# Patient Record
Sex: Male | Born: 1955
Health system: Southern US, Community
[De-identification: ages and names within clinical notes are randomized; demographics above are authoritative.]

## PROBLEM LIST (undated history)

## (undated) DIAGNOSIS — I483 Typical atrial flutter: Secondary | ICD-10-CM

## (undated) DIAGNOSIS — E785 Hyperlipidemia, unspecified: Secondary | ICD-10-CM

## (undated) DIAGNOSIS — N529 Male erectile dysfunction, unspecified: Secondary | ICD-10-CM

## (undated) DIAGNOSIS — K5792 Diverticulitis of intestine, part unspecified, without perforation or abscess without bleeding: Secondary | ICD-10-CM

## (undated) DIAGNOSIS — Z9889 Other specified postprocedural states: Secondary | ICD-10-CM

## (undated) DIAGNOSIS — I10 Essential (primary) hypertension: Secondary | ICD-10-CM

## (undated) DIAGNOSIS — R112 Nausea with vomiting, unspecified: Secondary | ICD-10-CM

## (undated) DIAGNOSIS — E039 Hypothyroidism, unspecified: Secondary | ICD-10-CM

## (undated) DIAGNOSIS — M7500 Adhesive capsulitis of unspecified shoulder: Secondary | ICD-10-CM

## (undated) DIAGNOSIS — E119 Type 2 diabetes mellitus without complications: Secondary | ICD-10-CM

## (undated) HISTORY — DX: Adhesive capsulitis of unspecified shoulder: M75.00

## (undated) HISTORY — PX: CERVICAL DISC SURGERY: SHX588

## (undated) HISTORY — DX: Hypothyroidism, unspecified: E03.9

## (undated) HISTORY — PX: BACK SURGERY: SHX140

## (undated) HISTORY — DX: Typical atrial flutter: I48.3

## (undated) HISTORY — PX: OTHER SURGICAL HISTORY: SHX169

## (undated) HISTORY — DX: Hyperlipidemia, unspecified: E78.5

## (undated) HISTORY — DX: Male erectile dysfunction, unspecified: N52.9

## (undated) HISTORY — PX: PROSTATE BIOPSY: SHX241

## (undated) HISTORY — DX: Essential (primary) hypertension: I10

---

## 2009-03-21 ENCOUNTER — Observation Stay (HOSPITAL_COMMUNITY): Admission: EM | Admit: 2009-03-21 | Discharge: 2009-03-22 | Payer: Self-pay | Admitting: Emergency Medicine

## 2011-02-13 LAB — CBC
Hemoglobin: 14 g/dL (ref 13.0–17.0)
MCHC: 35.3 g/dL (ref 30.0–36.0)
RDW: 13.2 % (ref 11.5–15.5)

## 2011-02-13 LAB — GLUCOSE, CAPILLARY: Glucose-Capillary: 175 mg/dL — ABNORMAL HIGH (ref 70–99)

## 2011-02-13 LAB — TROPONIN I: Troponin I: 0.03 ng/mL (ref 0.00–0.06)

## 2011-02-13 LAB — DIFFERENTIAL
Lymphs Abs: 1.3 10*3/uL (ref 0.7–4.0)
Monocytes Relative: 5 % (ref 3–12)
Neutro Abs: 4.9 10*3/uL (ref 1.7–7.7)
Neutrophils Relative %: 73 % (ref 43–77)

## 2011-02-13 LAB — CARDIAC PANEL(CRET KIN+CKTOT+MB+TROPI)
CK, MB: 2.5 ng/mL (ref 0.3–4.0)
Total CK: 256 U/L — ABNORMAL HIGH (ref 7–232)

## 2011-02-13 LAB — COMPREHENSIVE METABOLIC PANEL
ALT: 25 U/L (ref 0–53)
Calcium: 9.5 mg/dL (ref 8.4–10.5)
Glucose, Bld: 178 mg/dL — ABNORMAL HIGH (ref 70–99)
Sodium: 137 mEq/L (ref 135–145)
Total Protein: 6.9 g/dL (ref 6.0–8.3)

## 2011-02-13 LAB — POCT CARDIAC MARKERS
CKMB, poc: 2.4 ng/mL (ref 1.0–8.0)
Myoglobin, poc: 69.8 ng/mL (ref 12–200)
Troponin i, poc: <0.05 ng/mL (ref 0.00–0.09)

## 2011-02-13 LAB — CK TOTAL AND CKMB (NOT AT ARMC)
CK, MB: 3.5 ng/mL (ref 0.3–4.0)
Total CK: 335 U/L — ABNORMAL HIGH (ref 7–232)

## 2011-03-20 NOTE — H&P (Signed)
NAME:  Mark Shah, Mark Shah            ACCOUNT NO.:  [id number]   MEDICAL RECORD NO.:  [id number]          PATIENT TYPE:  EMS   LOCATION:  [facility]                         FACILITY:  [facility]   PHYSICIAN:  Hollice Espy, M.D.DATE OF BIRTH:  01/03/1956   DATE OF ADMISSION:  03/21/2009  DATE OF DISCHARGE:                              HISTORY & PHYSICAL   PRIMARY CARE PHYSICIAN:  Dr. Catha Gosselin.   CHIEF COMPLAINT:  Chest pressure.   HISTORY OF PRESENT ILLNESS:  The patient is a 55 year old white male  with a past medical history of diabetes and hypothyroidism who 2-days  ago started having some chest discomfort just left to the mid sternum,  described as pressure, non-radiating, initially constant for most of  Saturday, but then Sunday became intermittent, thus he waited to come  into the emergency room.  His pain never completely went away.  Again,  it radiated no where else, did not cause any shortness of breath,  nausea, vomiting, light headedness, dizziness, but with it never really  going away he became concerned and came into the emergency room for  further evaluation and treatment.  In the emergency room, he had a chest  x-ray which showed no active disease.  Labs were all completely normal.  Vital signs noted a blood pressure initially of 141/94, then down to  122/77.  He did not feel that this was positional.  He also felt that  when he took a deep inspiration this actually made it a little bit  better.  Currently, he is doing well.  He denies any headaches, vision  changes or dysphagia.  He complains of some mild chest pressure, about a  1/10.  No palpitations or shortness of breath, wheeze, cough, abdominal  pain, hematuria, dysuria, constipation, diarrhea, focal extremity  numbness, weakness or pain.   REVIEW OF SYSTEMS:  Otherwise negative.   PAST MEDICAL HISTORY:  1. Diabetes mellitus.  2. Hypertension.   MEDICATIONS:  1. Aspirin.  2. Fish oil.  3. Humalog, Lantus 18  units subcu in the morning.  4. Levoxyl 175 mcg p.o. daily.  5. Lipitor 80 p.o. daily.  6. Micardis which he takes because of kidneys because of his diabetes.   ALLERGIES:  HE IS ALLERGIC TO ERYTHROMYCIN.   SOCIAL HISTORY:  He denies any tobacco, alcohol or drug use.   FAMILY HISTORY:  Negative for coronary artery disease.  Noted for some  atrial fibrillation and valvular disease.   PHYSICAL EXAMINATION:  VITAL SIGNS:  On admission, temperature 98.2,  heart rate 89, blood pressure 141/94, now down to 123/77, respirations  18.  O2 saturation 97% on room air.  GENERAL:  He is alert and oriented x3 in no apparent distress.  HEENT:  Normocephalic, atraumatic.  His mucous membranes are moist.  NECK:  He has no carotid bruits.  HEART:  Regular rate and rhythm.  S1-S2.  LUNGS:  Clear to auscultation bilaterally.  ABDOMEN:  Soft, nontender, nondistended.  Positive bowel sounds.  EXTREMITIES:  Show no clubbing, cyanosis or edema.  SKIN:  Shows no wounds or breakdown.   LABORATORY DATA:  EKG  shows a normal sinus rhythm with NRSR prime in V1.  Chest x-ray is unremarkable.  White count is 6.6.  Hemoglobin and  hematocrit is 14 and 40.  MCV 96.  Platelet count 217.  Sodium 137,  potassium 3.6, chloride 104, bicarb 27, BUN 11, creatinine 1, glucose  178.  LFTs are normal.  CPK is 69.8, MB 2.4.  Troponin-I less than 0.05.   ASSESSMENT/PLAN:  Chest pain in a patient with a history of  hypothyroidism and diabetes mellitus.  We will plan to check two more  sets of cardiac markers and likely we will be able to get outpatient  stress test.      Hollice Espy, M.D.  Electronically Signed     Hollice Espy, M.D.  Electronically Signed    SKK/MEDQ  D:  03/21/2009  T:  03/21/2009  Job:  [id number]   cc:   Caryn Bee L. Little, M.D.

## 2013-08-28 ENCOUNTER — Encounter (HOSPITAL_COMMUNITY): Payer: Self-pay | Admitting: Emergency Medicine

## 2013-08-28 ENCOUNTER — Ambulatory Visit
Admission: RE | Admit: 2013-08-28 | Discharge: 2013-08-28 | Disposition: A | Discharge status: Home / Self Care | Payer: BC Managed Care – PPO | Source: Ambulatory Visit | Attending: Family Medicine | Admitting: Family Medicine

## 2013-08-28 ENCOUNTER — Inpatient Hospital Stay (HOSPITAL_COMMUNITY)
Admission: EM | Admit: 2013-08-28 | Discharge: 2013-08-31 | DRG: 392 | Disposition: A | Discharge status: Home / Self Care | Payer: BC Managed Care – PPO | Attending: Internal Medicine | Admitting: Internal Medicine

## 2013-08-28 ENCOUNTER — Other Ambulatory Visit: Payer: Self-pay | Admitting: Family Medicine

## 2013-08-28 DIAGNOSIS — R509 Fever, unspecified: Secondary | ICD-10-CM

## 2013-08-28 DIAGNOSIS — K5792 Diverticulitis of intestine, part unspecified, without perforation or abscess without bleeding: Secondary | ICD-10-CM | POA: Diagnosis present

## 2013-08-28 DIAGNOSIS — K5720 Diverticulitis of large intestine with perforation and abscess without bleeding: Secondary | ICD-10-CM

## 2013-08-28 DIAGNOSIS — Z79899 Other long term (current) drug therapy: Secondary | ICD-10-CM

## 2013-08-28 DIAGNOSIS — E109 Type 1 diabetes mellitus without complications: Secondary | ICD-10-CM | POA: Diagnosis present

## 2013-08-28 DIAGNOSIS — E1069 Type 1 diabetes mellitus with other specified complication: Secondary | ICD-10-CM | POA: Diagnosis present

## 2013-08-28 DIAGNOSIS — R109 Unspecified abdominal pain: Secondary | ICD-10-CM

## 2013-08-28 DIAGNOSIS — Z87891 Personal history of nicotine dependence: Secondary | ICD-10-CM

## 2013-08-28 DIAGNOSIS — Z9641 Presence of insulin pump (external) (internal): Secondary | ICD-10-CM

## 2013-08-28 DIAGNOSIS — E039 Hypothyroidism, unspecified: Secondary | ICD-10-CM | POA: Insufficient documentation

## 2013-08-28 DIAGNOSIS — K5732 Diverticulitis of large intestine without perforation or abscess without bleeding: Principal | ICD-10-CM | POA: Diagnosis present

## 2013-08-28 DIAGNOSIS — Z794 Long term (current) use of insulin: Secondary | ICD-10-CM

## 2013-08-28 DIAGNOSIS — E876 Hypokalemia: Secondary | ICD-10-CM | POA: Diagnosis present

## 2013-08-28 DIAGNOSIS — R946 Abnormal results of thyroid function studies: Secondary | ICD-10-CM | POA: Diagnosis present

## 2013-08-28 DIAGNOSIS — I10 Essential (primary) hypertension: Secondary | ICD-10-CM | POA: Diagnosis present

## 2013-08-28 HISTORY — DX: Nausea with vomiting, unspecified: R11.2

## 2013-08-28 HISTORY — DX: Type 2 diabetes mellitus without complications: E11.9

## 2013-08-28 HISTORY — DX: Type 1 diabetes mellitus without complications: E10.9

## 2013-08-28 HISTORY — DX: Other specified postprocedural states: Z98.890

## 2013-08-28 HISTORY — DX: Hypothyroidism, unspecified: E03.9

## 2013-08-28 HISTORY — DX: Diverticulitis of intestine, part unspecified, without perforation or abscess without bleeding: K57.92

## 2013-08-28 LAB — GLUCOSE, CAPILLARY
Glucose-Capillary: 39 mg/dL — CL (ref 70–99)
Glucose-Capillary: 88 mg/dL (ref 70–99)
Glucose-Capillary: 70 mg/dL (ref 70–99)

## 2013-08-28 LAB — CBC
HCT: 35.9 % — ABNORMAL LOW (ref 39.0–52.0)
MCHC: 35.1 g/dL (ref 30.0–36.0)
MCV: 94.7 fL (ref 78.0–100.0)
RDW: 12.3 % (ref 11.5–15.5)

## 2013-08-28 LAB — COMPREHENSIVE METABOLIC PANEL
Albumin: 3.3 g/dL — ABNORMAL LOW (ref 3.5–5.2)
BUN: 9 mg/dL (ref 6–23)
Chloride: 95 mEq/L — ABNORMAL LOW (ref 96–112)
Creatinine, Ser: 1.11 mg/dL (ref 0.50–1.35)
GFR calc Af Amer: 83 mL/min — ABNORMAL LOW (ref >90)
Glucose, Bld: 255 mg/dL — ABNORMAL HIGH (ref 70–99)
Total Bilirubin: 0.8 mg/dL (ref 0.3–1.2)
Total Protein: 7 g/dL (ref 6.0–8.3)

## 2013-08-28 MED ORDER — METRONIDAZOLE IN NACL 5-0.79 MG/ML-% IV SOLN
500.0000 mg | Freq: Three times a day (TID) | INTRAVENOUS | Status: DC
  Administered 2013-08-28 – 2013-08-31 (×8): 500 mg via INTRAVENOUS
  Filled 2013-08-28 (×9): qty 100

## 2013-08-28 MED ORDER — HYDROMORPHONE HCL PF 1 MG/ML IJ SOLN
0.5000 mg | INTRAMUSCULAR | Status: DC | PRN
  Administered 2013-08-28 – 2013-08-29 (×3): 0.5 mg via INTRAVENOUS
  Filled 2013-08-28 (×3): qty 1

## 2013-08-28 MED ORDER — CIPROFLOXACIN IN D5W 400 MG/200ML IV SOLN
400.0000 mg | Freq: Once | INTRAVENOUS | Status: AC
  Administered 2013-08-28: 400 mg via INTRAVENOUS
  Filled 2013-08-28 (×2): qty 200

## 2013-08-28 MED ORDER — CIPROFLOXACIN IN D5W 400 MG/200ML IV SOLN
400.0000 mg | Freq: Two times a day (BID) | INTRAVENOUS | Status: DC
  Administered 2013-08-28 – 2013-08-30 (×5): 400 mg via INTRAVENOUS
  Filled 2013-08-28 (×7): qty 200

## 2013-08-28 MED ORDER — DEXTROSE 50 % IV SOLN
INTRAVENOUS | Status: AC
  Filled 2013-08-28: qty 50

## 2013-08-28 MED ORDER — IOHEXOL 300 MG/ML  SOLN
100.0000 mL | Freq: Once | INTRAMUSCULAR | Status: AC | PRN
  Administered 2013-08-28: 100 mL via INTRAVENOUS

## 2013-08-28 MED ORDER — SODIUM CHLORIDE 0.9 % IV SOLN
INTRAVENOUS | Status: AC
  Administered 2013-08-28: 15:00:00 via INTRAVENOUS

## 2013-08-28 MED ORDER — ONDANSETRON HCL 4 MG/2ML IJ SOLN
4.0000 mg | Freq: Once | INTRAMUSCULAR | Status: AC
  Administered 2013-08-28: 4 mg via INTRAVENOUS
  Filled 2013-08-28: qty 2

## 2013-08-28 MED ORDER — METRONIDAZOLE IN NACL 5-0.79 MG/ML-% IV SOLN
500.0000 mg | Freq: Once | INTRAVENOUS | Status: AC
  Administered 2013-08-28: 500 mg via INTRAVENOUS
  Filled 2013-08-28: qty 100

## 2013-08-28 MED ORDER — LEVOTHYROXINE SODIUM 150 MCG PO TABS
150.0000 ug | ORAL_TABLET | Freq: Every day | ORAL | Status: DC
  Administered 2013-08-29 – 2013-08-31 (×3): 150 ug via ORAL
  Filled 2013-08-28 (×4): qty 1

## 2013-08-28 MED ORDER — HYDROMORPHONE HCL PF 1 MG/ML IJ SOLN: 1.0000 mg | INTRAMUSCULAR | Status: DC | PRN

## 2013-08-28 MED ORDER — SODIUM CHLORIDE 0.9 % IV SOLN
INTRAVENOUS | Status: DC
  Administered 2013-08-28 (×2): via INTRAVENOUS

## 2013-08-28 MED ORDER — INSULIN PUMP
Freq: Three times a day (TID) | SUBCUTANEOUS | Status: DC
  Administered 2013-08-29 – 2013-08-30 (×8): via SUBCUTANEOUS
  Administered 2013-08-31: 1.3 via SUBCUTANEOUS
  Administered 2013-08-31: 3.3 via SUBCUTANEOUS
  Filled 2013-08-28: qty 1

## 2013-08-28 MED ORDER — HYDROMORPHONE HCL PF 1 MG/ML IJ SOLN
1.0000 mg | Freq: Once | INTRAMUSCULAR | Status: AC
  Administered 2013-08-28: 1 mg via INTRAVENOUS
  Filled 2013-08-28: qty 1

## 2013-08-28 MED ORDER — SODIUM CHLORIDE 0.9 % IV BOLUS (SEPSIS)
1000.0000 mL | Freq: Once | INTRAVENOUS | Status: AC
  Administered 2013-08-28: 1000 mL via INTRAVENOUS

## 2013-08-28 MED ORDER — ONDANSETRON HCL 4 MG/2ML IJ SOLN
4.0000 mg | Freq: Three times a day (TID) | INTRAMUSCULAR | Status: DC | PRN
  Administered 2013-08-28: 4 mg via INTRAVENOUS
  Filled 2013-08-28: qty 2

## 2013-08-28 MED ORDER — DEXTROSE 50 % IV SOLN
25.0000 mL | Freq: Once | INTRAVENOUS | Status: AC | PRN
  Administered 2013-08-28: 25 mL via INTRAVENOUS

## 2013-08-28 NOTE — ED Provider Notes (Signed)
CSN: [id number]     Arrival date & time 08/28/13  1235 History   First MD Initiated Contact with Patient 08/28/13 1241     Chief Complaint  Patient presents with  . Abdominal Pain   (Consider location/radiation/quality/duration/timing/severity/associated sxs/prior Treatment) Patient is a 57 y.o. male presenting with abdominal pain. The history is provided by the patient.  Abdominal Pain Associated symptoms: nausea   Associated symptoms: no chest pain, no chills, no shortness of breath and no sore throat   pt c/o lower abd pain onset 2 days ago. Recently w mild uri c/o 1 week ago, resolved. Then abd cramping, decreased appetite, and for 2 days lower abd pain. Constant. Dull. Mod-sev. Worse w palpation. Non radiating. No vomiting. Had sl loose bm. subj fever. Went to pcp today and had outpt ct. Ct c/w diverticulitis w microperf. No hx same.      Past Medical History  Diagnosis Date  . Diverticulitis   . Diabetes mellitus without complication    Past Surgical History  Procedure Laterality Date  . Back surgery     No family history on file. History  Substance Use Topics  . Smoking status: Former Games developer  . Smokeless tobacco: Never Used  . Alcohol Use: Yes    Review of Systems  Constitutional: Negative for chills.  HENT: Negative for sore throat.   Eyes: Negative for redness.  Respiratory: Negative for shortness of breath.   Cardiovascular: Negative for chest pain.  Gastrointestinal: Positive for nausea and abdominal pain.  Genitourinary: Negative for flank pain.  Musculoskeletal: Negative for back pain and neck pain.  Skin: Negative for rash.  Neurological: Negative for headaches.  Hematological: Does not bruise/bleed easily.  Psychiatric/Behavioral: Negative for confusion.    Allergies  Review of patient's allergies indicates no known allergies.  Home Medications   Current Outpatient Rx  Name  Route  Sig  Dispense  Refill  . levothyroxine (SYNTHROID, LEVOTHROID)  150 MCG tablet   Oral   Take 150 mcg by mouth daily before breakfast.          BP 150/78  Pulse 92  Temp(Src) 98.8 F (37.1 C) (Oral)  Resp 16  Ht 5' 10.5" (1.791 m)  Wt 200 lb (90.719 kg)  BMI 28.28 kg/m2  SpO2 98% Physical Exam  Nursing note and vitals reviewed. Constitutional: He is oriented to person, place, and time. He appears well-developed and well-nourished. No distress.  HENT:  Head: Atraumatic.  Eyes: Pupils are equal, round, and reactive to light.  Neck: Neck supple. No tracheal deviation present.  Cardiovascular: Normal rate.   Pulmonary/Chest: Effort normal and breath sounds normal. No accessory muscle usage. No respiratory distress.  Abdominal: Soft. Bowel sounds are normal. He exhibits no distension and no mass. There is tenderness. There is no rebound and no guarding.  Marked bil lower quadrant tenderness. No rebound.   Genitourinary:  No cva tenderness  Musculoskeletal: Normal range of motion.  Neurological: He is alert and oriented to person, place, and time.  Skin: Skin is warm and dry.  Psychiatric: He has a normal mood and affect.    ED Course  Procedures (including critical care time)   Results for orders placed during the hospital encounter of 08/28/13  CBC      Result Value Range   WBC 7.8  4.0 - 10.5 K/uL   RBC 3.79 (*) 4.22 - 5.81 MIL/uL   Hemoglobin 12.6 (*) 13.0 - 17.0 g/dL   HCT 40.9 (*) 81.1 - 91.4 %  MCV 94.7  78.0 - 100.0 fL   MCH 33.2  26.0 - 34.0 pg   MCHC 35.1  30.0 - 36.0 g/dL   RDW 95.6  21.3 - 08.6 %   Platelets 191  150 - 400 K/uL  COMPREHENSIVE METABOLIC PANEL      Result Value Range   Sodium 131 (*) 135 - 145 mEq/L   Potassium 3.8  3.5 - 5.1 mEq/L   Chloride 95 (*) 96 - 112 mEq/L   CO2 25  19 - 32 mEq/L   Glucose, Bld 255 (*) 70 - 99 mg/dL   BUN 9  6 - 23 mg/dL   Creatinine, Ser 5.78  0.50 - 1.35 mg/dL   Calcium 9.0  8.4 - 46.9 mg/dL   Total Protein 7.0  6.0 - 8.3 g/dL   Albumin 3.3 (*) 3.5 - 5.2 g/dL   AST 17   0 - 37 U/L   ALT 18  0 - 53 U/L   Alkaline Phosphatase 68  39 - 117 U/L   Total Bilirubin 0.8  0.3 - 1.2 mg/dL   GFR calc non Af Amer 72 (*) >90 mL/min   GFR calc Af Amer 83 (*) >90 mL/min   Ct Abdomen Pelvis W Contrast  08/28/2013   CLINICAL DATA:  Generalized abdominal pain and fever  EXAM: CT ABDOMEN AND PELVIS WITH CONTRAST  TECHNIQUE: Multidetector CT imaging of the abdomen and pelvis was performed using the standard protocol following bolus administration of intravenous contrast. Oral contrast was also obtained. The labs were obtained at [address] [facility] site.  CONTRAST:  OMNIPAQUE IOHEXOL 300 MG/ML  SOLN  COMPARISON:  None.  FINDINGS: Lung bases are clear.  No focal liver lesions are identified. There is no biliary duct dilatation.  Spleen, pancreas, and adrenals appear normal.  Kidneys bilaterally show no mass or hydronephrosis on either side. No ureteral calculus or ureterectasis is seen.  In the pelvis, there is evidence of diverticulitis involving the proximal mid sigmoid colon. In this area, there is diffuse wall thickening with significant surrounding mesenteric thickening. There is no frank abscess. There is minimal fluid in the area of mesenteric thickening. There may be a single tiny micro perforation in this area seen on slice 59. There is no pelvic mass or fluid collection. The appendix appears normal.  There is no bowel obstruction. No free air or portal venous air.  There is no ascites, adenopathy, or abscess in the an or pelvis. Aorta is nonaneurysmal. There is degenerative change in the lumbar spine. There are no blastic or lytic bone lesions.  IMPRESSION: Sigmoid diverticulitis with probable tiny micro perforation in this area. No frank abscess. Appendix appears normal. No bowel obstruction. Study otherwise unremarkable except for degenerative change in the lumbar spine.   Electronically Signed   By: Bretta Bang M.D.   On: 08/28/2013 11:47     EKG Interpretation    None       MDM  Iv ns bolus. Dilaudid 1 mg iv. zofran iv. cipro and flagyl iv.  Reviewed earlier ct.   Med service called to admit.      Suzi Roots, MD 08/28/13 1357

## 2013-08-28 NOTE — ED Notes (Signed)
Dr Clarene Duke called, pt coming from radiology. Pt CT shows diverticulitis with perforations.

## 2013-08-28 NOTE — Progress Notes (Signed)
Pt with cbg of 70 at 2215 and then cbg of 75 at 1045. Pt suspended his insulin pump at 2215. Called midlevel d/t order to call if pt suspends >30 minutes.

## 2013-08-28 NOTE — ED Notes (Signed)
CBG 158  

## 2013-08-28 NOTE — Progress Notes (Signed)
Pt with cbg of 39. Hypoglycemia protocol initiated. Called mid level awaiting call back. Pt w/o any s/s of hypoglycemia. Pt is alert and oriented x 4.

## 2013-08-28 NOTE — Progress Notes (Signed)
   CARE MANAGEMENT ED NOTE 08/28/2013  Patient:  [facility]   Account Number:  [id number]  Date Initiated:  08/28/2013  Documentation initiated by:  Edd Arbour  Subjective/Objective Assessment:   57 yr old male [place] with LLQ abdominal pain without vomiting CT of abd indicates diverticulosis Given NS bolus, Dilaudid iv, flagyl iv cipro iv zofran iv and admitted     Subjective/Objective Assessment Detail:     Action/Plan:   Action/Plan Detail:   UR completed   Anticipated DC Date:  08/31/2013     Status Recommendation to Physician:   Result of Recommendation:    Other ED Services  Consult Working Plan    DC Planning Services  Other    Choice offered to / List presented to:            Status of service:  Completed, signed off  ED Comments:   ED Comments Detail:

## 2013-08-28 NOTE — ED Notes (Signed)
Pt states he gave himself 8/10th of a unit of insulin, states he still had 3/10th active.

## 2013-08-28 NOTE — ED Notes (Signed)
Pt recently had a CT scan which showed diverticulitis, wife and pt has had colds the past couple days with some diarrhea Monday and tuesday, then went to doctor today started having some abdominal cramping all over, denies n/v.

## 2013-08-28 NOTE — Progress Notes (Signed)
Patient currently has home Medtr[employer] Insulin pump connected to his left thigh.  Patient is alert & oriented x 4 and meets criteria to manage home pump.  Patient reviewed and signed Insulin Pump Contract and the Insulin Pump Flowsheet is at the bedside.  Allayne Butcher [facility]  08/28/2013  5:54 PM

## 2013-08-28 NOTE — H&P (Signed)
[facility] History and Physical  Mark Shah ZOX:[id number] DOB: 1956/10/08 DOA: 08/28/2013  Referring physician: Dr. Denton Lank PCP: No primary provider on file.  Specialists: none  Chief Complaint: abdominal pain  HPI: Mark Shah is a 57 y.o. male has a past medical history significant for IDDM1 on insulin pump (endocrinologist is Dr. Sharl Ma), HTN, hypothyroidism presents to the ED with abdominal pain. He recently saw his PCP who ordered a CT abdomen pelvis which showed mid sigmoid colon diverticulitis without abscess and with a possibility of a single tiny micro perforation in the area.  Patient has been complaining of worsening abdominal pain for the past 3-4 days and low grade temperature at home. His abdominal pain is mainly located left lower quadrant. He denies nausea/vomiting, had few episodes of diarrhea couple of days ago. He reports that he had a colonoscopy few years ago as part of his general screening and thinks it was "OK" without polyps and does not recall diverticuli as being found. He denies chest pain, breathing difficulties, denies lightheadedness or dizziness, no chest pain.   Review of Systems: as per HPI otherwise negative  Past Medical History  Diagnosis Date  . Diverticulitis   . Diabetes mellitus without complication   . PONV (postoperative nausea and vomiting)    Past Surgical History  Procedure Laterality Date  . Back surgery     Social History:  reports that he has quit smoking. He has never used smokeless tobacco. He reports that he drinks alcohol. He reports that he does not use illicit drugs.  No Known Allergies  History reviewed. No pertinent family history.   Prior to Admission medications   Medication Sig Start Date End Date Taking? Authorizing Provider  aspirin 325 MG tablet Take 325 mg by mouth daily.   Yes Historical Provider, MD  HUMALOG 100 UNIT/ML injection Inject 36-40 Units into the skin daily. 08/01/13  Yes Historical  Provider, MD  levothyroxine (SYNTHROID, LEVOTHROID) 150 MCG tablet Take 150 mcg by mouth daily before breakfast.   Yes Historical Provider, MD  pseudoephedrine (SUDAFED) 60 MG tablet Take 60 mg by mouth every 6 (six) hours as needed for congestion.   Yes Historical Provider, MD  telmisartan (MICARDIS) 40 MG tablet Take 40 mg by mouth daily.   Yes Historical Provider, MD  ZETIA 10 MG tablet Take 1 tablet by mouth daily. 06/08/13  Yes Historical Provider, MD   Physical Exam: Filed Vitals:   08/28/13 1249  BP: 150/78  Pulse: 92  Temp: 98.8 F (37.1 C)  TempSrc: Oral  Resp: 16  Height: 5' 10.5" (1.791 m)  Weight: 90.719 kg (200 lb)  SpO2: 98%     General:  No apparent distress  Eyes: PERRL, EOMI, no scleral icterus  ENT: moist oropharynx  Neck: supple, no JVD  Cardiovascular: regular rate without MRG; 2+ peripheral pulses  Respiratory: CTA biL, good air movement without wheezing, rhonchi or crackled  Abdomen: soft,   Skin: no rashes  Musculoskeletal: no peripheral edema  Psychiatric: normal mood and affect  Neurologic: CN 2-12 grossly intact, MS 5/5 in all 4  Labs on Admission:  Basic Metabolic Panel:  Recent Labs Lab 08/28/13 1312  NA 131*  K 3.8  CL 95*  CO2 25  GLUCOSE 255*  BUN 9  CREATININE 1.11  CALCIUM 9.0   Liver Function Tests:  Recent Labs Lab 08/28/13 1312  AST 17  ALT 18  ALKPHOS 68  BILITOT 0.8  PROT 7.0  ALBUMIN 3.3*   No results  found for this basename: LIPASE, AMYLASE,  in the last 168 hours No results found for this basename: AMMONIA,  in the last 168 hours CBC:  Recent Labs Lab 08/28/13 1312  WBC 7.8  HGB 12.6*  HCT 35.9*  MCV 94.7  PLT 191   Cardiac Enzymes: No results found for this basename: CKTOTAL, CKMB, CKMBINDEX, TROPONINI,  in the last 168 hours  BNP (last 3 results) No results found for this basename: PROBNP,  in the last 8760 hours CBG: No results found for this basename: GLUCAP,  in the last 168  hours  Radiological Exams on Admission: Ct Abdomen Pelvis W Contrast  08/28/2013   CLINICAL DATA:  Generalized abdominal pain and fever  EXAM: CT ABDOMEN AND PELVIS WITH CONTRAST  TECHNIQUE: Multidetector CT imaging of the abdomen and pelvis was performed using the standard protocol following bolus administration of intravenous contrast. Oral contrast was also obtained. The labs were obtained at [address] [facility] site.  CONTRAST:  OMNIPAQUE IOHEXOL 300 MG/ML  SOLN  COMPARISON:  None.  FINDINGS: Lung bases are clear.  No focal liver lesions are identified. There is no biliary duct dilatation.  Spleen, pancreas, and adrenals appear normal.  Kidneys bilaterally show no mass or hydronephrosis on either side. No ureteral calculus or ureterectasis is seen.  In the pelvis, there is evidence of diverticulitis involving the proximal mid sigmoid colon. In this area, there is diffuse wall thickening with significant surrounding mesenteric thickening. There is no frank abscess. There is minimal fluid in the area of mesenteric thickening. There may be a single tiny micro perforation in this area seen on slice 59. There is no pelvic mass or fluid collection. The appendix appears normal.  There is no bowel obstruction. No free air or portal venous air.  There is no ascites, adenopathy, or abscess in the an or pelvis. Aorta is nonaneurysmal. There is degenerative change in the lumbar spine. There are no blastic or lytic bone lesions.  IMPRESSION: Sigmoid diverticulitis with probable tiny micro perforation in this area. No frank abscess. Appendix appears normal. No bowel obstruction. Study otherwise unremarkable except for degenerative change in the lumbar spine.   Electronically Signed   By: Bretta Bang M.D.   On: 08/28/2013 11:47    EKG: Independently reviewed.  Assessment/Plan Principal Problem:   Diverticulitis Active Problems:   Type 1 diabetes mellitus   Hypothyroid   Diverticulitis  - start  Ciprofloxacin and Metronidazole. NPO except ice chips. Abdominal exam without guarding or rebound. "may be a single tiny microperforation" on the CT scan. Will monitor clinically for now. Consider advancing his diet and transitioning him to po antibiotics tomorrow.  IDDM - allow insulin pump. Hypothyroidism - check TSH  Diet: NPO Fluids: NS DVT Prophylaxis: SCDs  Code Status: Full (presumed)  Family Communication: none  Disposition Plan: inpatient, home when ready  Time spent: 25  Costin M. Elvera Lennox, MD [facility] Pager [phone]  If 7PM-7AM, please contact night-coverage [email or web address] Password [email or web address] 08/28/2013, 2:50 PM

## 2013-08-29 LAB — GLUCOSE, CAPILLARY
Glucose-Capillary: 45 mg/dL — ABNORMAL LOW (ref 70–99)
Glucose-Capillary: 101 mg/dL — ABNORMAL HIGH (ref 70–99)
Glucose-Capillary: 185 mg/dL — ABNORMAL HIGH (ref 70–99)
Glucose-Capillary: 202 mg/dL — ABNORMAL HIGH (ref 70–99)

## 2013-08-29 MED ORDER — DEXTROSE-NACL 5-0.9 % IV SOLN
INTRAVENOUS | Status: DC
  Administered 2013-08-29 – 2013-08-30 (×2): via INTRAVENOUS

## 2013-08-29 MED ORDER — ONDANSETRON HCL 4 MG/2ML IJ SOLN: 4.0000 mg | Freq: Three times a day (TID) | INTRAMUSCULAR | Status: DC | PRN

## 2013-08-29 MED ORDER — DEXTROSE 50 % IV SOLN
25.0000 mL | Freq: Once | INTRAVENOUS | Status: AC | PRN
  Administered 2013-08-29: 25 mL via INTRAVENOUS

## 2013-08-29 MED ORDER — PROMETHAZINE HCL 25 MG/ML IJ SOLN
25.0000 mg | Freq: Once | INTRAMUSCULAR | Status: AC
  Administered 2013-08-29: 25 mg via INTRAVENOUS
  Filled 2013-08-29: qty 1

## 2013-08-29 NOTE — Progress Notes (Signed)
[facility] PROGRESS NOTE  Delawrence Fridman WGN:[id number] DOB: 08/02/1956 DOA: 08/28/2013 PCP: Mickie Hillier, MD  Brief narrative: Mark Shah is an 57 y.o. male with a PMH of type 1 diabetes, hypertension, and hypothyroidism who was admitted on 08/28/2013 with diverticulitis.  Assessment/Plan: Principal Problem:   Diverticulitis -Admitted and placed on bowel rest. Advance diet to clear liquids. -Continue empiric Cipro/Flagyl. Active Problems:   Type 1 diabetes mellitus / hypoglycemia -Maintained on an insulin pump. CBGs 45-101.  -Add dextrose to IV fluids until diet advanced.   Hypothyroid -TSH elevated. Check free T4 Continue home dose of Synthroid for now.  Code Status: Full. Family Communication: No family currently at the bedside. Disposition Plan: Home when stable.   Medical Consultants:  None.  Other Consultants:  None.  Anti-infectives:  Cipro 08/28/2013--->  Flagyl 08/28/2013--->  HPI/Subjective: Mark Shah feels a bit better with no significant crampy abdominal pain today. No nausea or vomiting. Bowels have not moved since yesterday. No history of melena or hematochezia.  Objective: Filed Vitals:   08/28/13 1249 08/28/13 1551 08/28/13 1936 08/29/13 0629  BP: 150/78 122/77 117/70 101/62  Pulse: 92 80 75 79  Temp: 98.8 F (37.1 C) 98.1 F (36.7 C) 97.2 F (36.2 C) 99.5 F (37.5 C)  TempSrc: Oral Oral Oral Oral  Resp: 16 16 16 14   Height: 5' 10.5" (1.791 m) 5\' 10"  (1.778 m)    Weight: 90.719 kg (200 lb) 90.719 kg (200 lb)    SpO2: 98% 100% 100% 100%    Intake/Output Summary (Last 24 hours) at 08/29/13 0800 Last data filed at 08/29/13 0503  Gross per 24 hour  Intake   1375 ml  Output   1000 ml  Net    375 ml    Exam: Gen:  NAD Cardiovascular:  RRR, No M/R/G Respiratory:  Lungs CTAB Gastrointestinal:  Abdomen soft, NT/ND, hyperactive bowel sounds Extremities:  No C/E/C  Data Reviewed: Basic Metabolic  Panel:  Recent Labs Lab 08/28/13 1312  NA 131*  K 3.8  CL 95*  CO2 25  GLUCOSE 255*  BUN 9  CREATININE 1.11  CALCIUM 9.0   GFR Estimated Creatinine Clearance: 83.2 ml/min (by C-G formula based on Cr of 1.11). Liver Function Tests:  Recent Labs Lab 08/28/13 1312  AST 17  ALT 18  ALKPHOS 68  BILITOT 0.8  PROT 7.0  ALBUMIN 3.3*   CBC:  Recent Labs Lab 08/28/13 1312  WBC 7.8  HGB 12.6*  HCT 35.9*  MCV 94.7  PLT 191   CBG:  Recent Labs Lab 08/28/13 2205 08/28/13 2242 08/29/13 0128 08/29/13 0157 08/29/13 0400  GLUCAP 70 75 45* 101* 101*    Thyroid function studies  Recent Labs  08/28/13 1312  TSH 12.103*   Microbiology No results found for this or any previous visit (from the past 240 hour(s)).   Procedures and Diagnostic Studies: Ct Abdomen Pelvis W Contrast  08/28/2013   CLINICAL DATA:  Generalized abdominal pain and fever  EXAM: CT ABDOMEN AND PELVIS WITH CONTRAST  TECHNIQUE: Multidetector CT imaging of the abdomen and pelvis was performed using the standard protocol following bolus administration of intravenous contrast. Oral contrast was also obtained. The labs were obtained at [facility] site.  CONTRAST:  OMNIPAQUE IOHEXOL 300 MG/ML  SOLN  COMPARISON:  None.  FINDINGS: Lung bases are clear.  No focal liver lesions are identified. There is no biliary duct dilatation.  Spleen, pancreas, and adrenals appear normal.  Kidneys bilaterally show no mass  or hydronephrosis on either side. No ureteral calculus or ureterectasis is seen.  In the pelvis, there is evidence of diverticulitis involving the proximal mid sigmoid colon. In this area, there is diffuse wall thickening with significant surrounding mesenteric thickening. There is no frank abscess. There is minimal fluid in the area of mesenteric thickening. There may be a single tiny micro perforation in this area seen on slice 59. There is no pelvic mass or fluid collection. The appendix appears  normal.  There is no bowel obstruction. No free air or portal venous air.  There is no ascites, adenopathy, or abscess in the an or pelvis. Aorta is nonaneurysmal. There is degenerative change in the lumbar spine. There are no blastic or lytic bone lesions.  IMPRESSION: Sigmoid diverticulitis with probable tiny micro perforation in this area. No frank abscess. Appendix appears normal. No bowel obstruction. Study otherwise unremarkable except for degenerative change in the lumbar spine.   Electronically Signed   By: Bretta Bang M.D.   On: 08/28/2013 11:47    Scheduled Meds: . ciprofloxacin  400 mg Intravenous Q12H  . insulin pump   Subcutaneous TID AC, HS, 0200  . levothyroxine  150 mcg Oral QAC breakfast  . metronidazole  500 mg Intravenous Q8H   Continuous Infusions: . sodium chloride 75 mL/hr at 08/28/13 2244    Time spent: 35 minutes with > 50% of time discussing current diagnostic test results, clinical impression and plan of care.    LOS: 1 day   Jasan Doughtie  [facility] Pager [phone].   *Please note that the hospitalists switch teams on Wednesdays. Please call the flow manager at [phone] if you are having difficulty reaching the hospitalist taking care of this patient as she can update you and provide the most up-to-date pager number of provider caring for the patient. If 8PM-8AM, please contact night-coverage at [email or web address], password [facility]  08/29/2013, 8:00 AM      In an effort to keep you and your family informed about your hospital stay, I am providing you with this information sheet. If you or your family have any questions, please do not hesitate to have the nursing staff page me to set up a meeting time.  [name] 08/29/2013 1 (Number of days in the hospital)  Treatment team:  Dr. Hillery Aldo, Hospitalist (Internist)  Active Treatment Issues with Plan: Principal Problem:   Diverticulitis -You have been on bowel rest. If your pain  and nausea are better, we may advance your diet today liquids. -You are being treated with antibiotics: Cipro/Flagyl. Active Problems:   Type 1 diabetes mellitus / low blood sugar -Maintained on an insulin pump. CBGs 45-101.  -Sugar was added to your IV fluids to prevent low blood glucoses. We will stop this once your diet is advanced.   Under active thyroid -TSH elevated. Check free thyroid hormone level. Continue home dose of Synthroid for now.  Significant Lab results: -WBC 7.8 (normal). These are cells that fight infection. -TSH 12.103 (high). This measures thyroid stimulating hormone levels.  Significant diagnostic study results: -CT scan of the abdomen and pelvis 08/28/2013: Sigmoid diverticulitis. No abscess.  Anticipated discharge date: Depends on progress but typically diverticulitis is treated 2 to 4 days in the hospital.

## 2013-08-29 NOTE — Progress Notes (Signed)
Pt with cbg of 45. Called midlevel no new interventions at this time. Will continue to monitor.

## 2013-08-30 LAB — GLUCOSE, CAPILLARY
Glucose-Capillary: 161 mg/dL — ABNORMAL HIGH (ref 70–99)
Glucose-Capillary: 91 mg/dL (ref 70–99)

## 2013-08-30 MED ORDER — SODIUM CHLORIDE 0.9 % IV SOLN
INTRAVENOUS | Status: DC
  Administered 2013-08-30: 09:00:00 via INTRAVENOUS

## 2013-08-30 NOTE — Progress Notes (Addendum)
[facility] PROGRESS NOTE  Mark Shah WNU:[id number] DOB: 07/31/1956 DOA: 08/28/2013 PCP: Mickie Hillier, MD  Brief narrative: Mark Shah is an 57 y.o. male with a PMH of type 1 diabetes, hypertension, and hypothyroidism who was admitted on 08/28/2013 with diverticulitis.  Assessment/Plan: Principal Problem:   Diverticulitis -Admitted and placed on bowel rest. Advance to full liquids yesterday, but had some abdominal cramping with this change. Instructed to consume clear liquids for breakfast and if no further cramping, advance back to full liquids for lunch.. -Continue empiric Cipro/Flagyl. Active Problems:   Type 1 diabetes mellitus / hypoglycemia -Maintained on an insulin pump. CBGs 78-263.  -D/C dextrose in IVF.   Hypothyroid -TSH elevated. Free T4 WNL.  Continue home dose of Synthroid.  Code Status: Full. Family Communication: No family currently at the bedside. Disposition Plan: Home when stable.   Medical Consultants:  None.  Other Consultants:  None.  Anti-infectives:  Cipro 08/28/2013--->  Flagyl 08/28/2013--->  HPI/Subjective: Mark Shah says he had a bad night last night with a lot of abdominal cramping a few hours after he had full liquids for supper. Did not take any pain medications. Has had some loose stools but no melena or hematochezia. No nausea or vomiting.  Objective: Filed Vitals:   08/29/13 0629 08/29/13 1400 08/29/13 2128 08/30/13 0627  BP: 101/62 111/67 125/75 113/73  Pulse: 79 89 78 87  Temp: 99.5 F (37.5 C) 99.1 F (37.3 C) 99.4 F (37.4 C) 99.4 F (37.4 C)  TempSrc: Oral Oral Oral Oral  Resp: 14 18 18 16   Height:      Weight:      SpO2: 100% 97% 100% 97%    Intake/Output Summary (Last 24 hours) at 08/30/13 0347 Last data filed at 08/29/13 1900  Gross per 24 hour  Intake    360 ml  Output    850 ml  Net   -490 ml    Exam: Gen:  NAD Cardiovascular:  RRR, No M/R/G Respiratory:  Lungs  CTAB Gastrointestinal:  Abdomen soft, NT/ND, hyperactive bowel sounds Extremities:  No C/E/C  Data Reviewed: Basic Metabolic Panel:  Recent Labs Lab 08/28/13 1312  NA 131*  K 3.8  CL 95*  CO2 25  GLUCOSE 255*  BUN 9  CREATININE 1.11  CALCIUM 9.0   GFR Estimated Creatinine Clearance: 82.2 ml/min (by C-G formula based on Cr of 1.11). Liver Function Tests:  Recent Labs Lab 08/28/13 1312  AST 17  ALT 18  ALKPHOS 68  BILITOT 0.8  PROT 7.0  ALBUMIN 3.3*   CBC:  Recent Labs Lab 08/28/13 1312  WBC 7.8  HGB 12.6*  HCT 35.9*  MCV 94.7  PLT 191   CBG:  Recent Labs Lab 08/29/13 0801 08/29/13 1257 08/29/13 1643 08/29/13 2127 08/30/13 0225  GLUCAP 185* 263* 202* 161* 78    Thyroid function studies  Recent Labs  08/28/13 1312  TSH 12.103*   Microbiology No results found for this or any previous visit (from the past 240 hour(s)).   Procedures and Diagnostic Studies: Ct Abdomen Pelvis W Contrast  08/28/2013   CLINICAL DATA:  Generalized abdominal pain and fever  EXAM: CT ABDOMEN AND PELVIS WITH CONTRAST  TECHNIQUE: Multidetector CT imaging of the abdomen and pelvis was performed using the standard protocol following bolus administration of intravenous contrast. Oral contrast was also obtained. The labs were obtained at [address] [facility] site.  CONTRAST:  OMNIPAQUE IOHEXOL 300 MG/ML  SOLN  COMPARISON:  None.  FINDINGS: Lung bases  are clear.  No focal liver lesions are identified. There is no biliary duct dilatation.  Spleen, pancreas, and adrenals appear normal.  Kidneys bilaterally show no mass or hydronephrosis on either side. No ureteral calculus or ureterectasis is seen.  In the pelvis, there is evidence of diverticulitis involving the proximal mid sigmoid colon. In this area, there is diffuse wall thickening with significant surrounding mesenteric thickening. There is no frank abscess. There is minimal fluid in the area of mesenteric thickening. There  may be a single tiny micro perforation in this area seen on slice 59. There is no pelvic mass or fluid collection. The appendix appears normal.  There is no bowel obstruction. No free air or portal venous air.  There is no ascites, adenopathy, or abscess in the an or pelvis. Aorta is nonaneurysmal. There is degenerative change in the lumbar spine. There are no blastic or lytic bone lesions.  IMPRESSION: Sigmoid diverticulitis with probable tiny micro perforation in this area. No frank abscess. Appendix appears normal. No bowel obstruction. Study otherwise unremarkable except for degenerative change in the lumbar spine.   Electronically Signed   By: Bretta Bang M.D.   On: 08/28/2013 11:47    Scheduled Meds: . ciprofloxacin  400 mg Intravenous Q12H  . insulin pump   Subcutaneous TID AC, HS, 0200  . levothyroxine  150 mcg Oral QAC breakfast  . metronidazole  500 mg Intravenous Q8H   Continuous Infusions: . sodium chloride      Time spent: 25 minutes.    LOS: 2 days   Pranavi Aure  [facility] Pager [phone].   *Please note that the hospitalists switch teams on Wednesdays. Please call the flow manager at [phone] if you are having difficulty reaching the hospitalist taking care of this patient as she can update you and provide the most up-to-date pager number of provider caring for the patient. If 8PM-8AM, please contact night-coverage at [email or web address], password [facility]  08/30/2013, 9:03 AM      In an effort to keep you and your family informed about your hospital stay, I am providing you with this information sheet. If you or your family have any questions, please do not hesitate to have the nursing staff page me to set up a meeting time.  [name] 08/30/2013 2 (Number of days in the hospital)  Treatment team:  Dr. Hillery Aldo, Hospitalist (Internist)  Active Treatment Issues with Plan: Principal Problem:   Diverticulitis -Current diet: Full liquids. If  you are feeling better today, we will advance you to a low fiber diet. -You are being treated with antibiotics: Cipro/Flagyl. Active Problems:   Type 1 diabetes mellitus / low blood sugar -Maintained on an insulin pump. Blood sugar has ranged from 78-263.  -Sugar was discontinued from your IV fluids this morning.   Under active thyroid -TSH elevated but free thyroid hormone levels were normal. Continue home dose of Synthroid.  Significant Lab results: -WBC 7.8 (normal). These are cells that fight infection. -TSH 12.103 (high). This measures thyroid stimulating hormone levels.  Significant diagnostic study results: -CT scan of the abdomen and pelvis 08/28/2013: Sigmoid diverticulitis. No abscess.  Anticipated discharge date: 1-2 days.

## 2013-08-31 LAB — BASIC METABOLIC PANEL
CO2: 23 mEq/L (ref 19–32)
Calcium: 9.2 mg/dL (ref 8.4–10.5)
Chloride: 102 mEq/L (ref 96–112)
Glucose, Bld: 156 mg/dL — ABNORMAL HIGH (ref 70–99)
Potassium: 3.4 mEq/L — ABNORMAL LOW (ref 3.5–5.1)
Sodium: 137 mEq/L (ref 135–145)

## 2013-08-31 LAB — CBC
HCT: 33.7 % — ABNORMAL LOW (ref 39.0–52.0)
Hemoglobin: 11.8 g/dL — ABNORMAL LOW (ref 13.0–17.0)
MCH: 33.1 pg (ref 26.0–34.0)
MCV: 94.7 fL (ref 78.0–100.0)
Platelets: 213 10*3/uL (ref 150–400)
RBC: 3.56 MIL/uL — ABNORMAL LOW (ref 4.22–5.81)
RDW: 12.1 % (ref 11.5–15.5)
WBC: 4.9 10*3/uL (ref 4.0–10.5)

## 2013-08-31 LAB — GLUCOSE, CAPILLARY
Glucose-Capillary: 117 mg/dL — ABNORMAL HIGH (ref 70–99)
Glucose-Capillary: 160 mg/dL — ABNORMAL HIGH (ref 70–99)

## 2013-08-31 MED ORDER — CIPROFLOXACIN HCL 500 MG PO TABS: 500.0000 mg | ORAL_TABLET | Freq: Two times a day (BID) | ORAL | Status: DC

## 2013-08-31 MED ORDER — POTASSIUM CHLORIDE CRYS ER 20 MEQ PO TBCR
20.0000 meq | EXTENDED_RELEASE_TABLET | Freq: Two times a day (BID) | ORAL | Status: DC
  Administered 2013-08-31: 20 meq via ORAL
  Filled 2013-08-31 (×2): qty 1

## 2013-08-31 MED ORDER — METRONIDAZOLE 500 MG PO TABS
500.0000 mg | ORAL_TABLET | Freq: Three times a day (TID) | ORAL | Status: DC
  Administered 2013-08-31: 500 mg via ORAL
  Filled 2013-08-31 (×3): qty 1

## 2013-08-31 MED ORDER — OXYCODONE-ACETAMINOPHEN 5-325 MG PO TABS: 1.0000 | ORAL_TABLET | ORAL | Status: DC | PRN

## 2013-08-31 MED ORDER — METRONIDAZOLE 500 MG PO TABS: 500.0000 mg | ORAL_TABLET | Freq: Three times a day (TID) | ORAL | Status: DC

## 2013-08-31 MED ORDER — CIPROFLOXACIN HCL 500 MG PO TABS
500.0000 mg | ORAL_TABLET | Freq: Two times a day (BID) | ORAL | Status: DC
  Administered 2013-08-31: 500 mg via ORAL
  Filled 2013-08-31 (×3): qty 1

## 2013-08-31 NOTE — Progress Notes (Signed)
Pt called around 1 am for [place] to check CBG . CBG was 50. Hypoglcemic protocol was initiated. cbg was rechecked and it  Was 112. Will continue to monitor closely.

## 2013-08-31 NOTE — Discharge Summary (Signed)
Physician Discharge Summary  Mark Shah ZHY:[id number] DOB: 06-28-1956 DOA: 08/28/2013  PCP: Mickie Hillier, MD  Admit date: 08/28/2013 Discharge date: 08/31/2013  Recommendations for Outpatient Follow-up:  1. F/U with PCP for non-resolution of symptoms.  Consider outpatient GI referral for colonoscopy in 6-8 weeks.  Discharge Diagnoses:  Principal Problem:    Diverticulitis Active Problems:    Type 1 diabetes mellitus    Hypothyroid    H/O HTN    Hypokalemia    Hypoglycemia   Discharge Condition: Improved.  Diet recommendation: Carb modified.  History of present illness:  Mark Shah is an 57 y.o. male with a PMH of type 1 diabetes, hypertension, and hypothyroidism who was admitted on 08/28/2013 with diverticulitis.  Hospital Course by problem:  Principal Problem:  Diverticulitis  -Admitted and placed on bowel rest. Diet slowly advanced as tolerated.  D/C home with instructions to continue to advance diet as tolerated with low residue for next few days, then high fiber diet.  -Continue empiric Cipro/Flagyl for 10 more days.  Active Problems:  Hypokalemia  -Repleted.  Type 1 diabetes mellitus / hypoglycemia  -Maintained on an insulin pump. Hypoglycemia resolved. Hypothyroid  -TSH elevated. Free T4 WNL. Continue home dose of Synthroid. H/O HTN -Resume anti-hypertensives at discharge.  Procedures:  None.  Consultations:  None.  Discharge Exam: Filed Vitals:   08/31/13 1347  BP: 129/80  Pulse: 80  Temp: 98.6 F (37 C)  Resp: 16   Filed Vitals:   08/30/13 1552 08/30/13 2040 08/31/13 0515 08/31/13 1347  BP: 127/78 137/86 114/63 129/80  Pulse: 84 71 87 80  Temp: 98.4 F (36.9 C) 98.5 F (36.9 C) 98.9 F (37.2 C) 98.6 F (37 C)  TempSrc: Oral Oral Oral Oral  Resp: 16 18 16 16   Height:      Weight:      SpO2: 99% 100% 99% 99%    Gen:  NAD Cardiovascular:  RRR, No M/R/G Respiratory: Lungs CTAB Gastrointestinal: Abdomen soft,  NT/ND with normal active bowel sounds. Extremities: No C/E/C   Discharge Instructions  Discharge Orders   Future Orders Complete By Expires   Call MD for:  persistant nausea and vomiting  As directed    Call MD for:  severe uncontrolled pain  As directed    Call MD for:  temperature >100.4  As directed    Diet Carb Modified  As directed    Discharge instructions  As directed    Comments:     You were cared for by Dr. Hillery Aldo  (a hospitalist) during your hospital stay. If you have any questions about your discharge medications or the care you received while you were in the hospital after you are discharged, you can call the unit and ask to speak with the hospitalist on call if the hospitalist that took care of you is not available. Once you are discharged, your primary care physician will handle any further medical issues. Please note that NO REFILLS for any discharge medications will be authorized once you are discharged, as it is imperative that you return to your primary care physician (or establish a relationship with a primary care physician if you do not have one) for your aftercare needs so that they can reassess your need for medications and monitor your lab values.  Any outstanding tests can be reviewed by your PCP at your follow up visit.  It is also important to review any medicine changes with your PCP.  Please bring these d/c instructions with  you to your next visit so your physician can review these changes with you.  If you do not have a primary care physician, you can call [phone] for a physician referral.  It is highly recommended that you obtain a PCP for hospital follow up.   Increase activity slowly  As directed        Medication List         aspirin 325 MG tablet  Take 325 mg by mouth daily.     ciprofloxacin 500 MG tablet  Commonly known as:  CIPRO  Take 1 tablet (500 mg total) by mouth 2 (two) times daily.     HUMALOG 100 UNIT/ML injection  Generic drug:   insulin lispro  Inject 36-40 Units into the skin daily.     levothyroxine 150 MCG tablet  Commonly known as:  SYNTHROID, LEVOTHROID  Take 150 mcg by mouth daily before breakfast.     metroNIDAZOLE 500 MG tablet  Commonly known as:  FLAGYL  Take 1 tablet (500 mg total) by mouth every 8 (eight) hours.     oxyCODONE-acetaminophen 5-325 MG per tablet  Commonly known as:  PERCOCET/ROXICET  Take 1 tablet by mouth every 4 (four) hours as needed.     pseudoephedrine 60 MG tablet  Commonly known as:  SUDAFED  Take 60 mg by mouth every 6 (six) hours as needed for congestion.     telmisartan 40 MG tablet  Commonly known as:  MICARDIS  Take 40 mg by mouth daily.     ZETIA 10 MG tablet  Generic drug:  ezetimibe  Take 1 tablet by mouth daily.           Follow-up Information   Schedule an appointment as soon as possible for a visit with Mickie Hillier, MD. (As needed)    Specialty:  Family Medicine   Contact information:   [address] [place] [address] [phone]        The results of significant diagnostics from this hospitalization (including imaging, microbiology, ancillary and laboratory) are listed below for reference.    Significant Diagnostic Studies: Ct Abdomen Pelvis W Contrast  08/28/2013   CLINICAL DATA:  Generalized abdominal pain and fever  EXAM: CT ABDOMEN AND PELVIS WITH CONTRAST  TECHNIQUE: Multidetector CT imaging of the abdomen and pelvis was performed using the standard protocol following bolus administration of intravenous contrast. Oral contrast was also obtained. The labs were obtained at [address] [facility] site.  CONTRAST:  OMNIPAQUE IOHEXOL 300 MG/ML  SOLN  COMPARISON:  None.  FINDINGS: Lung bases are clear.  No focal liver lesions are identified. There is no biliary duct dilatation.  Spleen, pancreas, and adrenals appear normal.  Kidneys bilaterally show no mass or hydronephrosis on either side. No ureteral calculus or ureterectasis is  seen.  In the pelvis, there is evidence of diverticulitis involving the proximal mid sigmoid colon. In this area, there is diffuse wall thickening with significant surrounding mesenteric thickening. There is no frank abscess. There is minimal fluid in the area of mesenteric thickening. There may be a single tiny micro perforation in this area seen on slice 59. There is no pelvic mass or fluid collection. The appendix appears normal.  There is no bowel obstruction. No free air or portal venous air.  There is no ascites, adenopathy, or abscess in the an or pelvis. Aorta is nonaneurysmal. There is degenerative change in the lumbar spine. There are no blastic or lytic bone lesions.  IMPRESSION: Sigmoid  diverticulitis with probable tiny micro perforation in this area. No frank abscess. Appendix appears normal. No bowel obstruction. Study otherwise unremarkable except for degenerative change in the lumbar spine.   Electronically Signed   By: Bretta Bang M.D.   On: 08/28/2013 11:47    Labs:  Basic Metabolic Panel:  Recent Labs Lab 08/28/13 1312 08/31/13 0420  NA 131* 137  K 3.8 3.4*  CL 95* 102  CO2 25 23  GLUCOSE 255* 156*  BUN 9 5*  CREATININE 1.11 1.03  CALCIUM 9.0 9.2   GFR Estimated Creatinine Clearance: 88.6 ml/min (by C-G formula based on Cr of 1.03). Liver Function Tests:  Recent Labs Lab 08/28/13 1312  AST 17  ALT 18  ALKPHOS 68  BILITOT 0.8  PROT 7.0  ALBUMIN 3.3*    CBC:  Recent Labs Lab 08/28/13 1312 08/31/13 0420  WBC 7.8 4.9  HGB 12.6* 11.8*  HCT 35.9* 33.7*  MCV 94.7 94.7  PLT 191 213   CBG:  Recent Labs Lab 08/30/13 1818 08/30/13 2246 08/31/13 0102 08/31/13 0140 08/31/13 0925  GLUCAP 146* 91 50* 117* 160*    Time coordinating discharge: 25 minutes.  Signed:  Shammond Arave  Pager [phone] [facility] 08/31/2013, 2:33 PM

## 2013-08-31 NOTE — Progress Notes (Signed)
[facility] PROGRESS NOTE  Mark Shah ZOX:[id number] DOB: January 30, 1956 DOA: 08/28/2013 PCP: Mickie Hillier, MD  Brief narrative: Mark Shah is an 57 y.o. male with a PMH of type 1 diabetes, hypertension, and hypothyroidism who was admitted on 08/28/2013 with diverticulitis.  Assessment/Plan: Principal Problem:   Diverticulitis -Admitted and placed on bowel rest. Advance to full liquids yesterday, but had some abdominal cramping with this change. Instructed to consume clear liquids for breakfast and if no further cramping, advance back to full liquids for lunch.. -Continue empiric Cipro/Flagyl. Active Problems:   Hypokalemia -Replete.   Type 1 diabetes mellitus / hypoglycemia -Maintained on an insulin pump. CBGs 50-146.    Hypothyroid -TSH elevated. Free T4 WNL.  Continue home dose of Synthroid.  Code Status: Full. Family Communication: No family currently at the bedside. Disposition Plan: Home when stable.   Medical Consultants:  None.  Other Consultants:  None.  Anti-infectives:  Cipro 08/28/2013--->  Flagyl 08/28/2013--->  HPI/Subjective: [name] tolerated to full liquids without recurrent abdominal cramping last night. No nausea or vomiting. Still having some loose stools but no melena or hematochezia.  Objective: Filed Vitals:   08/30/13 0627 08/30/13 1552 08/30/13 2040 08/31/13 0515  BP: 113/73 127/78 137/86 114/63  Pulse: 87 84 71 87  Temp: 99.4 F (37.4 C) 98.4 F (36.9 C) 98.5 F (36.9 C) 98.9 F (37.2 C)  TempSrc: Oral Oral Oral Oral  Resp: 16 16 18 16   Height:      Weight:      SpO2: 97% 99% 100% 99%    Intake/Output Summary (Last 24 hours) at 08/31/13 0928 Last data filed at 08/30/13 1558  Gross per 24 hour  Intake    360 ml  Output      0 ml  Net    360 ml    Exam: Gen:  NAD Cardiovascular:  RRR, No M/R/G Respiratory:  Lungs CTAB Gastrointestinal:  Abdomen soft, NT/ND, hyperactive bowel  sounds Extremities:  No C/E/C  Data Reviewed: Basic Metabolic Panel:  Recent Labs Lab 08/28/13 1312 08/31/13 0420  NA 131* 137  K 3.8 3.4*  CL 95* 102  CO2 25 23  GLUCOSE 255* 156*  BUN 9 5*  CREATININE 1.11 1.03  CALCIUM 9.0 9.2   GFR Estimated Creatinine Clearance: 88.6 ml/min (by C-G formula based on Cr of 1.03). Liver Function Tests:  Recent Labs Lab 08/28/13 1312  AST 17  ALT 18  ALKPHOS 68  BILITOT 0.8  PROT 7.0  ALBUMIN 3.3*   CBC:  Recent Labs Lab 08/28/13 1312 08/31/13 0420  WBC 7.8 4.9  HGB 12.6* 11.8*  HCT 35.9* 33.7*  MCV 94.7 94.7  PLT 191 213   CBG:  Recent Labs Lab 08/30/13 1818 08/30/13 2246 08/31/13 0102 08/31/13 0140 08/31/13 0925  GLUCAP 146* 91 50* 117* 160*    Thyroid function studies  Recent Labs  08/28/13 1312  TSH 12.103*   Microbiology No results found for this or any previous visit (from the past 240 hour(s)).   Procedures and Diagnostic Studies: Ct Abdomen Pelvis W Contrast  08/28/2013   CLINICAL DATA:  Generalized abdominal pain and fever  EXAM: CT ABDOMEN AND PELVIS WITH CONTRAST  TECHNIQUE: Multidetector CT imaging of the abdomen and pelvis was performed using the standard protocol following bolus administration of intravenous contrast. Oral contrast was also obtained. The labs were obtained at [facility] site.  CONTRAST:  OMNIPAQUE IOHEXOL 300 MG/ML  SOLN  COMPARISON:  None.  FINDINGS: Lung bases  are clear.  No focal liver lesions are identified. There is no biliary duct dilatation.  Spleen, pancreas, and adrenals appear normal.  Kidneys bilaterally show no mass or hydronephrosis on either side. No ureteral calculus or ureterectasis is seen.  In the pelvis, there is evidence of diverticulitis involving the proximal mid sigmoid colon. In this area, there is diffuse wall thickening with significant surrounding mesenteric thickening. There is no frank abscess. There is minimal fluid in the area of  mesenteric thickening. There may be a single tiny micro perforation in this area seen on slice 59. There is no pelvic mass or fluid collection. The appendix appears normal.  There is no bowel obstruction. No free air or portal venous air.  There is no ascites, adenopathy, or abscess in the an or pelvis. Aorta is nonaneurysmal. There is degenerative change in the lumbar spine. There are no blastic or lytic bone lesions.  IMPRESSION: Sigmoid diverticulitis with probable tiny micro perforation in this area. No frank abscess. Appendix appears normal. No bowel obstruction. Study otherwise unremarkable except for degenerative change in the lumbar spine.   Electronically Signed   By: Bretta Bang M.D.   On: 08/28/2013 11:47    Scheduled Meds: . ciprofloxacin  400 mg Intravenous Q12H  . insulin pump   Subcutaneous TID AC, HS, 0200  . levothyroxine  150 mcg Oral QAC breakfast  . metronidazole  500 mg Intravenous Q8H  . potassium chloride  20 mEq Oral BID   Continuous Infusions: . sodium chloride 75 mL/hr at 08/30/13 0835    Time spent: 25 minutes.    LOS: 3 days   Stepahnie Campo  [facility] Pager [phone].   *Please note that the hospitalists switch teams on Wednesdays. Please call the flow manager at [phone] if you are having difficulty reaching the hospitalist taking care of this patient as she can update you and provide the most up-to-date pager number of provider caring for the patient. If 8PM-8AM, please contact night-coverage at [email or web address], password [email or web address]  08/31/2013, 9:28 AM      In an effort to keep you and your family informed about your hospital stay, I am providing you with this information sheet. If you or your family have any questions, please do not hesitate to have the nursing staff page me to set up a meeting time.  Amauri Shah 08/31/2013 3 (Number of days in the hospital)  Treatment team:  Dr. Hillery Aldo, Hospitalist (Internist)  Active  Treatment Issues with Plan: Principal Problem:   Diverticulitis -Current diet: Full liquids. If you are feeling better today, we will advance you to a low fiber diet. -You are being treated with antibiotics: Cipro/Flagyl. Active Problems:   Type 1 diabetes mellitus / low blood sugar -Maintained on an insulin pump. Blood sugar has ranged from 50-160.    Under active thyroid -TSH elevated but free thyroid hormone levels were normal. Continue home dose of Synthroid.  Significant Lab results: -WBC 4.9  (normal). These are cells that fight infection. -TSH 12.103 (high). This measures thyroid stimulating hormone levels. -Potassium 3.4 (low).  We will give you a potassium supplement today.  Significant diagnostic study results: -CT scan of the abdomen and pelvis 08/28/2013: Sigmoid diverticulitis. No abscess.  Anticipated discharge date: Depends on your progress/diet advancement.

## 2014-08-03 ENCOUNTER — Encounter: Payer: Self-pay | Admitting: *Deleted

## 2014-10-04 ENCOUNTER — Emergency Department (HOSPITAL_COMMUNITY)
Admission: EM | Admit: 2014-10-04 | Discharge: 2014-10-05 | Disposition: A | Discharge status: Home / Self Care | Payer: BC Managed Care – PPO | Attending: Emergency Medicine | Admitting: Emergency Medicine

## 2014-10-04 DIAGNOSIS — Z794 Long term (current) use of insulin: Secondary | ICD-10-CM | POA: Diagnosis not present

## 2014-10-04 DIAGNOSIS — E039 Hypothyroidism, unspecified: Secondary | ICD-10-CM | POA: Diagnosis not present

## 2014-10-04 DIAGNOSIS — Z87448 Personal history of other diseases of urinary system: Secondary | ICD-10-CM | POA: Insufficient documentation

## 2014-10-04 DIAGNOSIS — R55 Syncope and collapse: Secondary | ICD-10-CM | POA: Insufficient documentation

## 2014-10-04 DIAGNOSIS — I10 Essential (primary) hypertension: Secondary | ICD-10-CM | POA: Insufficient documentation

## 2014-10-04 DIAGNOSIS — E119 Type 2 diabetes mellitus without complications: Secondary | ICD-10-CM | POA: Insufficient documentation

## 2014-10-04 DIAGNOSIS — Z8719 Personal history of other diseases of the digestive system: Secondary | ICD-10-CM | POA: Diagnosis not present

## 2014-10-04 DIAGNOSIS — Z87891 Personal history of nicotine dependence: Secondary | ICD-10-CM | POA: Diagnosis not present

## 2014-10-04 DIAGNOSIS — Z79899 Other long term (current) drug therapy: Secondary | ICD-10-CM | POA: Insufficient documentation

## 2014-10-04 NOTE — ED Notes (Signed)
Pt to ED via [facility] after reported having syncopal episode while in bathroom.  Pt is diabetic so wife thought blood sugar was low and gave him a glass of orange juice.  Medical illustratorire Dept. Check CBG on there arrival and CBG was 400.  Pt denies chest pain, shortness of breath, only complaint is double vision

## 2014-10-04 NOTE — ED Provider Notes (Signed)
CSN: [id number]     Arrival date & time 10/04/14  2353 History  This chart was scribed for Loren Raceravid Vic Esco, MD by Murriel HopperAlec Bankhead, ED Scribe. This patient was seen in room D36C/D36C and the patient's care was started at 12:54 AM.    Chief Complaint  Patient presents with  . Loss of Consciousness      The history is provided by the patient and the spouse. No language interpreter was used.    HPI Comments: Amil AmenJoseph Degrasse is a 58 y.o. male who is diabetic who presents to the Emergency Department complaining of a fall that occurred three hours PTA while he was checking his blood sugar in his bathroom. Pt states that he does not remember very much about the episode. Pt says that he came home from dinner, and the last thing that he remembers was getting ready for bed while talking to his wife in his bathroom. His wife said that she heard a loud thud and found him conscious lying on his side. She said when she started asking him questions that he started talking gibberish. Pt denies any similar episodes happening in the past. Pt states that he now feels tired, but feels normal otherwise. Pt denies any chest pain, nausea, or vomiting.      Past Medical History  Diagnosis Date  . Diverticulitis   . Diabetes mellitus without complication   . PONV (postoperative nausea and vomiting)   . HTN (hypertension)   . ED (erectile dysfunction)   . Hypothyroidism   . Dyslipidemia   . Adhesive capsulitis     in shoulders   Past Surgical History  Procedure Laterality Date  . Back surgery    . Cervical disc surgery    . Left hand     Family History  Problem Relation Age of Onset  . Hyperlipidemia Mother   . Asthma Mother   . Asthma Father   . COPD Father   . Cancer - Lung Maternal Grandfather   . Hyperlipidemia Paternal Grandfather   . Stroke Paternal Grandfather   . CVA Paternal Grandfather    History  Substance Use Topics  . Smoking status: Former Games developermoker  . Smokeless tobacco: Never Used   . Alcohol Use: Yes    Review of Systems  Constitutional: Negative for fever and chills.  Respiratory: Negative for cough and shortness of breath.   Cardiovascular: Negative for chest pain.  Gastrointestinal: Negative for nausea, vomiting, abdominal pain and diarrhea.  Musculoskeletal: Negative for back pain, neck pain and neck stiffness.  Skin: Negative for rash and wound.  Neurological: Positive for syncope. Negative for dizziness, weakness, light-headedness, numbness and headaches.  All other systems reviewed and are negative.     Allergies  Erythromycin  Home Medications   Prior to Admission medications   Medication Sig Start Date End Date Taking? Authorizing Provider  Glucagon, rDNA, (GLUCAGON EMERGENCY IJ) Inject 1 mg as directed as needed (as directed for diabetes.Marland Kitchen.).   Yes Historical Provider, MD  insulin glargine (LANTUS) 100 UNIT/ML injection Inject 11 Units into the skin 2 (two) times daily. ONLY IF INSULIN PUMP CAN NOT BE USED   Yes Historical Provider, MD  insulin lispro (HUMALOG) 100 UNIT/ML injection Inject into the skin as directed. As directed with insulin pump. No unites given.   Yes Historical Provider, MD  levothyroxine (SYNTHROID, LEVOTHROID) 175 MCG tablet Take 175 mcg by mouth daily before breakfast.   Yes Historical Provider, MD  loratadine (CLARITIN) 10 MG tablet Take 10 mg  by mouth daily as needed for allergies.   Yes Historical Provider, MD  Omega-3 Fatty Acids (FISH OIL PO) Take 1 capsule by mouth daily.   Yes Historical Provider, MD  sildenafil (VIAGRA) 100 MG tablet Take 100 mg by mouth daily as needed for erectile dysfunction.   Yes Historical Provider, MD  telmisartan (MICARDIS) 40 MG tablet Take 40 mg by mouth daily.   Yes Historical Provider, MD  ZETIA 10 MG tablet Take 1 tablet by mouth daily. 06/08/13  Yes Historical Provider, MD  zolpidem (AMBIEN) 10 MG tablet Take 10 mg by mouth as needed for sleep (with travel: notes very little).   Yes Historical  Provider, MD  atorvastatin (LIPITOR) 80 MG tablet Take 80 mg by mouth daily.    Historical Provider, MD  levothyroxine (SYNTHROID, LEVOTHROID) 150 MCG tablet Take 150 mcg by mouth daily before breakfast.    Historical Provider, MD   BP 115/71 mmHg  Pulse 85  Temp(Src) 98.1 F (36.7 C) (Oral)  Resp 16  Ht 5\' 10"  (1.778 m)  Wt 190 lb (86.183 kg)  BMI 27.26 kg/m2  SpO2 98% Physical Exam  Constitutional: He is oriented to person, place, and time. He appears well-developed and well-nourished. No distress.  HENT:  Head: Normocephalic and atraumatic.  Mouth/Throat: Oropharynx is clear and moist.  Eyes: EOM are normal. Pupils are equal, round, and reactive to light.  Neck: Normal range of motion. Neck supple.  No posterior midline cervical tenderness to palpation.  Cardiovascular: Normal rate and regular rhythm.  Exam reveals no gallop and no friction rub.   No murmur heard. Pulmonary/Chest: Effort normal and breath sounds normal. No respiratory distress. He has no wheezes. He has no rales. He exhibits no tenderness.  Abdominal: Soft. Bowel sounds are normal. He exhibits no distension and no mass. There is no tenderness. There is no rebound and no guarding.  Musculoskeletal: Normal range of motion. He exhibits no edema or tenderness.  Distal pulses intact. No calf swelling or tenderness.  Neurological: He is alert and oriented to person, place, and time.  Patient is alert and oriented x3 with clear, goal oriented speech. Patient has 5/5 motor in all extremities. Sensation is intact to light touch. Bilateral finger-to-nose is normal with no signs of dysmetria. Patient has a normal gait and walks without assistance.  Skin: Skin is warm and dry. No rash noted. No erythema.  Psychiatric: He has a normal mood and affect. His behavior is normal.  Nursing note and vitals reviewed.   ED Course  Procedures (including critical care time)  DIAGNOSTIC STUDIES:  COORDINATION OF CARE: 5:00 AM  Discussed treatment plan with pt at bedside and pt agreed to plan.   Labs Review Labs Reviewed  CBC WITH DIFFERENTIAL - Abnormal; Notable for the following:    RBC 4.04 (*)    HCT 37.3 (*)    All other components within normal limits  COMPREHENSIVE METABOLIC PANEL - Abnormal; Notable for the following:    Sodium 131 (*)    Chloride 94 (*)    Glucose, Bld 388 (*)    Anion gap 17 (*)    All other components within normal limits  CBG MONITORING, ED - Abnormal; Notable for the following:    Glucose-Capillary 393 (*)    All other components within normal limits  CBG MONITORING, ED - Abnormal; Notable for the following:    Glucose-Capillary 126 (*)    All other components within normal limits  TROPONIN I  Imaging Review Ct Head Wo Contrast  10/05/2014   CLINICAL DATA:  Initial evaluation for syncope and collapse.  EXAM: CT HEAD WITHOUT CONTRAST  TECHNIQUE: Contiguous axial images were obtained from the base of the skull through the vertex without intravenous contrast.  COMPARISON:  None.  FINDINGS: Diffuse prominence of the CSF containing spaces is compatible with generalized cerebral atrophy. Mild patchy hypodensity within the deep white matter both cerebral hemispheres is most consistent with chronic small vessel ischemic changes, mild for patient age.  No acute large vessel territory infarct identified. Gray-white matter differentiation is maintained. Intracranial vasculature within normal limits. No mass lesion or midline shift. No mass effect. No hydrocephalus. No extra-axial fluid collection.  Calvarium intact. Scalp soft tissues within normal limits. No acute abnormality seen about the orbits.  Paranasal sinuses and mastoid air cells are clear.  IMPRESSION: 1. No acute intracranial process identified. 2. Mild atrophy with chronic small vessel ischemic disease.   Electronically Signed   By: Rise Mu M.D.   On: 10/05/2014 01:50     EKG Interpretation None      MDM    Final diagnoses:  Syncope and collapse    I personally performed the services described in this documentation, which was scribed in my presence. The recorded information has been reviewed and is accurate.  Patient observed in emergency department. Neurologic exam remains normal. Normal CT head. Patient is asking to be discharged home. Have advised he needs to follow up with his primary physician. He's been given strict return precautions and is voiced understanding.  Loren Racer, MD 10/06/14 0500

## 2014-10-05 ENCOUNTER — Encounter (HOSPITAL_COMMUNITY): Payer: Self-pay | Admitting: Emergency Medicine

## 2014-10-05 ENCOUNTER — Emergency Department (HOSPITAL_COMMUNITY): Payer: BC Managed Care – PPO

## 2014-10-05 LAB — COMPREHENSIVE METABOLIC PANEL
ALT: 21 U/L (ref 0–53)
AST: 18 U/L (ref 0–37)
Albumin: 4.3 g/dL (ref 3.5–5.2)
Alkaline Phosphatase: 56 U/L (ref 39–117)
Anion gap: 17 — ABNORMAL HIGH (ref 5–15)
BUN: 12 mg/dL (ref 6–23)
CO2: 20 meq/L (ref 19–32)
CREATININE: 0.86 mg/dL (ref 0.50–1.35)
Calcium: 8.7 mg/dL (ref 8.4–10.5)
Chloride: 94 mEq/L — ABNORMAL LOW (ref 96–112)
GLUCOSE: 388 mg/dL — AB (ref 70–99)
Potassium: 4.7 mEq/L (ref 3.7–5.3)
SODIUM: 131 meq/L — AB (ref 137–147)
TOTAL PROTEIN: 6.7 g/dL (ref 6.0–8.3)
Total Bilirubin: 0.3 mg/dL (ref 0.3–1.2)

## 2014-10-05 LAB — CBC WITH DIFFERENTIAL/PLATELET
BASOS ABS: 0 10*3/uL (ref 0.0–0.1)
BASOS PCT: 1 % (ref 0–1)
EOS ABS: 0.1 10*3/uL (ref 0.0–0.7)
Eosinophils Relative: 3 % (ref 0–5)
HCT: 37.3 % — ABNORMAL LOW (ref 39.0–52.0)
Hemoglobin: 13 g/dL (ref 13.0–17.0)
Lymphocytes Relative: 27 % (ref 12–46)
Lymphs Abs: 1.3 10*3/uL (ref 0.7–4.0)
MCH: 32.2 pg (ref 26.0–34.0)
MCHC: 34.9 g/dL (ref 30.0–36.0)
MCV: 92.3 fL (ref 78.0–100.0)
Monocytes Absolute: 0.4 10*3/uL (ref 0.1–1.0)
Monocytes Relative: 7 % (ref 3–12)
NEUTROS PCT: 62 % (ref 43–77)
NRBC: 0 /100{WBCs}
Neutro Abs: 3 10*3/uL (ref 1.7–7.7)
PLATELETS: 220 10*3/uL (ref 150–400)
RBC: 4.04 MIL/uL — AB (ref 4.22–5.81)
RDW: 12.8 % (ref 11.5–15.5)
WBC: 4.8 10*3/uL (ref 4.0–10.5)

## 2014-10-05 LAB — CBG MONITORING, ED
GLUCOSE-CAPILLARY: 393 mg/dL — AB (ref 70–99)
Glucose-Capillary: 126 mg/dL — ABNORMAL HIGH (ref 70–99)

## 2014-10-05 LAB — TROPONIN I

## 2014-10-05 MED ORDER — INSULIN ASPART 100 UNIT/ML IV SOLN
10.0000 [IU] | Freq: Once | INTRAVENOUS | Status: AC
  Administered 2014-10-05: 10 [IU] via INTRAVENOUS
  Filled 2014-10-05: qty 1

## 2014-10-05 MED ORDER — SODIUM CHLORIDE 0.9 % IV BOLUS (SEPSIS)
1000.0000 mL | Freq: Once | INTRAVENOUS | Status: AC
  Administered 2014-10-05: 1000 mL via INTRAVENOUS

## 2014-10-05 NOTE — ED Notes (Signed)
Dr. Ranae PalmsYelverton back in to talk to pt and wife.

## 2014-10-05 NOTE — ED Notes (Signed)
Pt resting quietly with no complaints.  Wife at bedside.

## 2014-10-05 NOTE — Discharge Instructions (Signed)
Syncope °Syncope is a medical term for fainting or passing out. This means you lose consciousness and drop to the ground. People are generally unconscious for less than 5 minutes. You may have some muscle twitches for up to 15 seconds before waking up and returning to normal. Syncope occurs more often in older adults, but it can happen to anyone. While most causes of syncope are not dangerous, syncope can be a sign of a serious medical problem. It is important to seek medical care.  °CAUSES  °Syncope is caused by a sudden drop in blood flow to the brain. The specific cause is often not determined. Factors that can bring on syncope include: °· Taking medicines that lower blood pressure. °· Sudden changes in posture, such as standing up quickly. °· Taking more medicine than prescribed. °· Standing in one place for too long. °· Seizure disorders. °· Dehydration and excessive exposure to heat. °· Low blood sugar (hypoglycemia). °· Straining to have a bowel movement. °· Heart disease, irregular heartbeat, or other circulatory problems. °· Fear, emotional distress, seeing blood, or severe pain. °SYMPTOMS  °Right before fainting, you may: °· Feel dizzy or light-headed. °· Feel nauseous. °· See all white or all black in your field of vision. °· Have cold, clammy skin. °DIAGNOSIS  °Your health care provider will ask about your symptoms, perform a physical exam, and perform an electrocardiogram (ECG) to record the electrical activity of your heart. Your health care provider may also perform other heart or blood tests to determine the cause of your syncope which may include: °· Transthoracic echocardiogram (TTE). During echocardiography, sound waves are used to evaluate how blood flows through your heart. °· Transesophageal echocardiogram (TEE). °· Cardiac monitoring. This allows your health care provider to monitor your heart rate and rhythm in real time. °· Holter monitor. This is a portable device that records your  heartbeat and can help diagnose heart arrhythmias. It allows your health care provider to track your heart activity for several days, if needed. °· Stress tests by exercise or by giving medicine that makes the heart beat faster. °TREATMENT  °In most cases, no treatment is needed. Depending on the cause of your syncope, your health care provider may recommend changing or stopping some of your medicines. °HOME CARE INSTRUCTIONS °· Have someone stay with you until you feel stable. °· Do not drive, use machinery, or play sports until your health care provider says it is okay. °· Keep all follow-up appointments as directed by your health care provider. °· Lie down right away if you start feeling like you might faint. Breathe deeply and steadily. Wait until all the symptoms have passed. °· Drink enough fluids to keep your urine clear or pale yellow. °· If you are taking blood pressure or heart medicine, get up slowly and take several minutes to sit and then stand. This can reduce dizziness. °SEEK IMMEDIATE MEDICAL CARE IF:  °· You have a severe headache. °· You have unusual pain in the chest, abdomen, or back. °· You are bleeding from your mouth or rectum, or you have black or tarry stool. °· You have an irregular or very fast heartbeat. °· You have pain with breathing. °· You have repeated fainting or seizure-like jerking during an episode. °· You faint when sitting or lying down. °· You have confusion. °· You have trouble walking. °· You have severe weakness. °· You have vision problems. °If you fainted, call your local emergency services (911 in [place] Do not drive   yourself to the hospital.  °MAKE SURE YOU: °· Understand these instructions. °· Will watch your condition. °· Will get help right away if you are not doing well or get worse. °Document Released: 10/22/2005 Document Revised: 10/27/2013 Document Reviewed: 12/21/2011 °Exit[employer]® Patient Information ©2015 [employer]. This information is not intended to replace  advice given to you by your health care provider. Make sure you discuss any questions you have with your health care provider. ° °

## 2015-11-16 ENCOUNTER — Encounter: Payer: BLUE CROSS/BLUE SHIELD | Attending: Internal Medicine | Admitting: *Deleted

## 2015-11-16 DIAGNOSIS — Z713 Dietary counseling and surveillance: Secondary | ICD-10-CM | POA: Diagnosis not present

## 2015-11-16 DIAGNOSIS — E108 Type 1 diabetes mellitus with unspecified complications: Secondary | ICD-10-CM | POA: Diagnosis present

## 2015-11-18 ENCOUNTER — Encounter: Payer: Self-pay | Admitting: *Deleted

## 2015-11-18 NOTE — Patient Instructions (Signed)
Reviewed options on Medtronic 630G and 670G insulin pumps Provided brochure Patient chooses to contact pump company directly to inquire about moving forward

## 2015-11-18 NOTE — Progress Notes (Signed)
Diabetes Self-Management Education  Visit Type: First/Initial  Appt. Start Time: 1400 Appt. End Time: 1430  11/18/2015  [name], identified by name and date of birth, is a 60 y.o. male with a diagnosis of Diabetes: Type 1.   ASSESSMENT  There were no vitals taken for this visit. There is no weight on file to calculate BMI.      Diabetes Self-Management Education - 11/18/15 1358    Visit Information   Visit Type First/Initial   Initial Visit   Diabetes Type Type 1   Are you currently following a meal plan? No   Date Diagnosed 1996   Health Coping   How would you rate your overall health? Good   Psychosocial Assessment   Patient Belief/Attitude about Diabetes Motivated to manage diabetes   Self-care barriers None   Self-management support Doctor's office   Other persons present Patient   Patient Concerns Medication;Other (comment)  INfo on Medtronic 670G pump   Special Needs None   How often do you need to have someone help you when you read instructions, pamphlets, or other written materials from your doctor or pharmacy? 1 - Never   Complications   Last HgB A1C per patient/outside source 7.7 %   How often do you check your blood sugar? > 4 times/day   Have you had a dilated eye exam in the past 12 months? Yes   Have you had a dental exam in the past 12 months? Yes   Are you checking your feet? Yes   How many days per week are you checking your feet? 3   Exercise   Exercise Type Light (walking / raking leaves)   How many days per week to you exercise? 2   How many minutes per day do you exercise? 75   Total minutes per week of exercise 150   Patient Education   Previous Diabetes Education Yes (please comment)   Medications --  Explained new features of Medtronic 630G and 670 G insulin pumps   Individualized Goals (developed by patient)   Medications --  Contact [employer] directly to check on moving towards new pump   Outcomes   Expected Outcomes  Demonstrated interest in learning. Expect positive outcomes   Future DMSE PRN   Program Status Completed      Individualized Plan for Diabetes Self-Management Training:   Learning Objective:  Patient will have a greater understanding of diabetes self-management. Patient education plan is to attend individual and/or group sessions per assessed needs and concerns.   Plan:   Patient Instructions  Reviewed options on Medtronic 630G and 670G insulin pumps Provided brochure Patient chooses to contact pump company directly to inquire about moving forward     Expected Outcomes:  Demonstrated interest in learning. Expect positive outcomes  Education material provided: Medtronic 630G Brochure  If problems or questions, patient to contact team via:  Phone and Email  Future DSME appointment: PRN

## 2016-03-07 ENCOUNTER — Emergency Department (HOSPITAL_COMMUNITY)
Admission: EM | Admit: 2016-03-07 | Discharge: 2016-03-07 | Disposition: A | Discharge status: Home / Self Care | Payer: BLUE CROSS/BLUE SHIELD | Attending: Emergency Medicine | Admitting: Emergency Medicine

## 2016-03-07 ENCOUNTER — Encounter (HOSPITAL_COMMUNITY): Payer: Self-pay | Admitting: Emergency Medicine

## 2016-03-07 ENCOUNTER — Emergency Department (HOSPITAL_COMMUNITY): Payer: BLUE CROSS/BLUE SHIELD

## 2016-03-07 DIAGNOSIS — Z87891 Personal history of nicotine dependence: Secondary | ICD-10-CM | POA: Insufficient documentation

## 2016-03-07 DIAGNOSIS — R109 Unspecified abdominal pain: Secondary | ICD-10-CM | POA: Diagnosis present

## 2016-03-07 DIAGNOSIS — Z79899 Other long term (current) drug therapy: Secondary | ICD-10-CM | POA: Diagnosis not present

## 2016-03-07 DIAGNOSIS — Z794 Long term (current) use of insulin: Secondary | ICD-10-CM | POA: Insufficient documentation

## 2016-03-07 DIAGNOSIS — Z87438 Personal history of other diseases of male genital organs: Secondary | ICD-10-CM | POA: Diagnosis not present

## 2016-03-07 DIAGNOSIS — K6389 Other specified diseases of intestine: Secondary | ICD-10-CM | POA: Insufficient documentation

## 2016-03-07 DIAGNOSIS — K529 Noninfective gastroenteritis and colitis, unspecified: Secondary | ICD-10-CM

## 2016-03-07 DIAGNOSIS — E119 Type 2 diabetes mellitus without complications: Secondary | ICD-10-CM | POA: Diagnosis not present

## 2016-03-07 DIAGNOSIS — I10 Essential (primary) hypertension: Secondary | ICD-10-CM | POA: Diagnosis not present

## 2016-03-07 LAB — URINALYSIS, ROUTINE W REFLEX MICROSCOPIC
Bilirubin Urine: NEGATIVE
Glucose, UA: NEGATIVE mg/dL
HGB URINE DIPSTICK: NEGATIVE
Ketones, ur: NEGATIVE mg/dL
LEUKOCYTES UA: NEGATIVE
Nitrite: NEGATIVE
PROTEIN: NEGATIVE mg/dL
SPECIFIC GRAVITY, URINE: 1.007 (ref 1.005–1.030)
pH: 6.5 (ref 5.0–8.0)

## 2016-03-07 LAB — CBC WITH DIFFERENTIAL/PLATELET
BASOS ABS: 0 10*3/uL (ref 0.0–0.1)
Basophils Relative: 0 %
EOS ABS: 0.1 10*3/uL (ref 0.0–0.7)
Eosinophils Relative: 1 %
HCT: 39.4 % (ref 39.0–52.0)
Hemoglobin: 13.9 g/dL (ref 13.0–17.0)
LYMPHS PCT: 21 %
Lymphs Abs: 1.6 10*3/uL (ref 0.7–4.0)
MCH: 33.3 pg (ref 26.0–34.0)
MCHC: 35.3 g/dL (ref 30.0–36.0)
MCV: 94.3 fL (ref 78.0–100.0)
MONO ABS: 0.7 10*3/uL (ref 0.1–1.0)
Monocytes Relative: 9 %
Neutro Abs: 5.3 10*3/uL (ref 1.7–7.7)
Neutrophils Relative %: 69 %
PLATELETS: 257 10*3/uL (ref 150–400)
RBC: 4.18 MIL/uL — AB (ref 4.22–5.81)
RDW: 12.5 % (ref 11.5–15.5)
WBC: 7.7 10*3/uL (ref 4.0–10.5)

## 2016-03-07 LAB — CBG MONITORING, ED
GLUCOSE-CAPILLARY: 72 mg/dL (ref 65–99)
Glucose-Capillary: 57 mg/dL — ABNORMAL LOW (ref 65–99)
Glucose-Capillary: 125 mg/dL — ABNORMAL HIGH (ref 65–99)
Glucose-Capillary: 59 mg/dL — ABNORMAL LOW (ref 65–99)

## 2016-03-07 LAB — COMPREHENSIVE METABOLIC PANEL
ALT: 21 U/L (ref 17–63)
AST: 21 U/L (ref 15–41)
Albumin: 4 g/dL (ref 3.5–5.0)
Alkaline Phosphatase: 79 U/L (ref 38–126)
Anion gap: 9 (ref 5–15)
BUN: 13 mg/dL (ref 6–20)
CHLORIDE: 103 mmol/L (ref 101–111)
CO2: 24 mmol/L (ref 22–32)
CREATININE: 1.17 mg/dL (ref 0.61–1.24)
Calcium: 9 mg/dL (ref 8.9–10.3)
Glucose, Bld: 76 mg/dL (ref 65–99)
POTASSIUM: 3.6 mmol/L (ref 3.5–5.1)
Sodium: 136 mmol/L (ref 135–145)
Total Bilirubin: 0.4 mg/dL (ref 0.3–1.2)
Total Protein: 7.5 g/dL (ref 6.5–8.1)

## 2016-03-07 LAB — I-STAT CG4 LACTIC ACID, ED
LACTIC ACID, VENOUS: 0.46 mmol/L — AB (ref 0.5–2.0)
Lactic Acid, Venous: 0.45 mmol/L — ABNORMAL LOW (ref 0.5–2.0)

## 2016-03-07 MED ORDER — IOPAMIDOL (ISOVUE-300) INJECTION 61%
100.0000 mL | Freq: Once | INTRAVENOUS | Status: AC | PRN
  Administered 2016-03-07: 100 mL via INTRAVENOUS

## 2016-03-07 MED ORDER — DEXTROSE 50 % IV SOLN
INTRAVENOUS | Status: AC
  Administered 2016-03-07: 50 mL via INTRAVENOUS
  Filled 2016-03-07: qty 50

## 2016-03-07 MED ORDER — MORPHINE SULFATE (PF) 4 MG/ML IV SOLN
4.0000 mg | Freq: Once | INTRAVENOUS | Status: AC
  Administered 2016-03-07: 4 mg via INTRAVENOUS
  Filled 2016-03-07: qty 1

## 2016-03-07 MED ORDER — KETOROLAC TROMETHAMINE 30 MG/ML IJ SOLN
30.0000 mg | Freq: Once | INTRAMUSCULAR | Status: AC
  Administered 2016-03-07: 30 mg via INTRAVENOUS
  Filled 2016-03-07: qty 1

## 2016-03-07 MED ORDER — HYDROCODONE-ACETAMINOPHEN 5-325 MG PO TABS: 1.0000 | ORAL_TABLET | Freq: Four times a day (QID) | ORAL | Status: DC | PRN

## 2016-03-07 MED ORDER — SODIUM CHLORIDE 0.9 % IV BOLUS (SEPSIS)
1000.0000 mL | Freq: Once | INTRAVENOUS | Status: AC
  Administered 2016-03-07: 1000 mL via INTRAVENOUS

## 2016-03-07 MED ORDER — DEXTROSE 50 % IV SOLN
50.0000 mL | Freq: Once | INTRAVENOUS | Status: AC
  Administered 2016-03-07: 50 mL via INTRAVENOUS

## 2016-03-07 MED ORDER — DIATRIZOATE MEGLUMINE & SODIUM 66-10 % PO SOLN
15.0000 mL | Freq: Once | ORAL | Status: AC
  Administered 2016-03-07: 15 mL via ORAL

## 2016-03-07 MED ORDER — ONDANSETRON HCL 4 MG/2ML IJ SOLN
4.0000 mg | Freq: Once | INTRAMUSCULAR | Status: AC
  Administered 2016-03-07: 4 mg via INTRAVENOUS
  Filled 2016-03-07: qty 2

## 2016-03-07 MED ORDER — IBUPROFEN 800 MG PO TABS: 800.0000 mg | ORAL_TABLET | Freq: Three times a day (TID) | ORAL | Status: DC

## 2016-03-07 NOTE — ED Provider Notes (Signed)
CSN: [id number]     Arrival date & time 03/07/16  1601 History   First MD Initiated Contact with Patient 03/07/16 1653     Chief Complaint  Patient presents with  . Abdominal Pain     (Consider location/radiation/quality/duration/timing/severity/associated sxs/prior Treatment) The history is provided by the patient.  Mark Shah is a 60 y.o. male hx of diverticulitis, DM, HTN, HL, Here presenting with abdominal pain. Abdominal pain for the last 4 days. States that it's on the lower abdomen and is worse on the right side. Some nausea but no vomiting. He is Having less bowel movements than usual but denies any diarrhea or constipation. He had a fever on Sunday but started taking Cipro and went to urgent care Monday and was prescribed Flagyl and the fever resolved. However, the pain has persisted. Denies any urinary symptoms. He was admitted in 2014 for diverticulitis with microperforation and improved with IV antibiotics.      Past Medical History  Diagnosis Date  . Diverticulitis   . Diabetes mellitus without complication (HCC)   . PONV (postoperative nausea and vomiting)   . HTN (hypertension)   . ED (erectile dysfunction)   . Hypothyroidism   . Dyslipidemia   . Adhesive capsulitis     in shoulders  . Hyperlipidemia    Past Surgical History  Procedure Laterality Date  . Back surgery    . Cervical disc surgery    . Left hand     Family History  Problem Relation Age of Onset  . Hyperlipidemia Mother   . Asthma Mother   . Asthma Father   . COPD Father   . Cancer - Lung Maternal Grandfather   . Hyperlipidemia Paternal Grandfather   . Stroke Paternal Grandfather   . CVA Paternal Grandfather    Social History  Substance Use Topics  . Smoking status: Former Games developer  . Smokeless tobacco: Never Used  . Alcohol Use: Yes    Review of Systems  Gastrointestinal: Positive for abdominal pain.  All other systems reviewed and are negative.     Allergies   Erythromycin  Home Medications   Prior to Admission medications   Medication Sig Start Date End Date Taking? Authorizing Provider  ciprofloxacin (CIPRO) 500 MG tablet Take 500 mg by mouth 2 (two) times daily. ABT Start Date 03/04/16 & End Date 03/14/16. 03/05/16  Yes Historical Provider, MD  fluticasone (FLONASE) 50 MCG/ACT nasal spray USE 1 SPRAY IN EACH NOSTRIL DAILY PRN FOR ALLERGIES 12/28/15  Yes Historical Provider, MD  insulin lispro (HUMALOG) 100 UNIT/ML injection Inject into the skin as directed. As directed with insulin pump. No unites given.   Yes Historical Provider, MD  metroNIDAZOLE (FLAGYL) 500 MG tablet Take 500 mg by mouth 2 (two) times daily. ABT Start Date 03/05/16 & End Date 03/15/16. 03/05/16  Yes Historical Provider, MD  SYNTHROID 200 MCG tablet Take 200 mcg by mouth daily before breakfast.  02/09/16  Yes Historical Provider, MD  telmisartan (MICARDIS) 40 MG tablet Take 40 mg by mouth daily.   Yes Historical Provider, MD  ZETIA 10 MG tablet Take 1 tablet by mouth daily. 06/08/13  Yes Historical Provider, MD  chlorpheniramine-HYDROcodone (TUSSIONEX) 10-8 MG/5ML SUER TAKE 1 TEASPOONFUL Y MOUTH EVERY 12 HOURS AS NEEDED FOR COUGH 12/07/15   Historical Provider, MD  Glucagon, rDNA, (GLUCAGON EMERGENCY IJ) Inject 1 mg as directed as needed (as directed for diabetes.Marland Kitchen).    Historical Provider, MD  insulin glargine (LANTUS) 100 UNIT/ML injection Inject 11 Units  into the skin 2 (two) times daily. ONLY IF INSULIN PUMP CAN NOT BE USED    Historical Provider, MD  loratadine (CLARITIN) 10 MG tablet Take 10 mg by mouth daily as needed for allergies.    Historical Provider, MD  sildenafil (VIAGRA) 100 MG tablet Take 100 mg by mouth daily as needed for erectile dysfunction.    Historical Provider, MD  zolpidem (AMBIEN) 10 MG tablet Take 10 mg by mouth at bedtime as needed for sleep.  02/20/16   Historical Provider, MD   BP 137/87 mmHg  Pulse 73  Temp(Src) 99.2 F (37.3 C) (Oral)  Resp 16  SpO2  98% Physical Exam  Constitutional: He is oriented to person, place, and time. He appears well-developed and well-nourished.  Slightly uncomfortable   HENT:  Head: Normocephalic.  Mouth/Throat: Oropharynx is clear and moist.  Eyes: Conjunctivae are normal. Pupils are equal, round, and reactive to light.  Neck: Normal range of motion. Neck supple.  Cardiovascular: Normal rate, regular rhythm and normal heart sounds.   Pulmonary/Chest: Effort normal and breath sounds normal. No respiratory distress. He has no wheezes. He has no rales.  Abdominal: Soft. Bowel sounds are normal.  + RLQ and LLQ tenderness, no rebound   Musculoskeletal: Normal range of motion. He exhibits no edema or tenderness.  Neurological: He is alert and oriented to person, place, and time. No cranial nerve deficit. Coordination normal.  Skin: Skin is warm and dry.  Psychiatric: He has a normal mood and affect. His behavior is normal. Judgment and thought content normal.  Nursing note and vitals reviewed.   ED Course  Procedures (including critical care time) Labs Review Labs Reviewed  CBC WITH DIFFERENTIAL/PLATELET - Abnormal; Notable for the following:    RBC 4.18 (*)    All other components within normal limits  I-STAT CG4 LACTIC ACID, ED - Abnormal; Notable for the following:    Lactic Acid, Venous 0.46 (*)    All other components within normal limits  CBG MONITORING, ED - Abnormal; Notable for the following:    Glucose-Capillary 57 (*)    All other components within normal limits  CBG MONITORING, ED - Abnormal; Notable for the following:    Glucose-Capillary 125 (*)    All other components within normal limits  I-STAT CG4 LACTIC ACID, ED - Abnormal; Notable for the following:    Lactic Acid, Venous 0.45 (*)    All other components within normal limits  CBG MONITORING, ED - Abnormal; Notable for the following:    Glucose-Capillary 59 (*)    All other components within normal limits  COMPREHENSIVE METABOLIC  PANEL  URINALYSIS, ROUTINE W REFLEX MICROSCOPIC (NOT AT [facility])  CBG MONITORING, ED    Imaging Review Ct Abdomen Pelvis W Contrast  03/07/2016  CLINICAL DATA:  60 year old with severe generalized abdominal pain over the past 3 days. Prior history of diverticulitis. EXAM: CT ABDOMEN AND PELVIS WITH CONTRAST TECHNIQUE: Multidetector CT imaging of the abdomen and pelvis was performed using the standard protocol following bolus administration of intravenous contrast. CONTRAST:  ISOVUE-300 IOPAMIDOL 61% IV. Oral contrast was also administered. COMPARISON:  08/28/2013. FINDINGS: Lower chest:  Heart size normal. Visualized lung bases clear. Hepatobiliary: Liver normal in size and appearance. Gallbladder normal in appearance without calcified gallstones. No biliary ductal dilation. Pancreas: Normal in appearance without evidence of mass, ductal dilation, or inflammation. Spleen: Normal in size and appearance. Adrenals/Urinary Tract: Normal appearing adrenal glands. Kidneys normal in appearance without hydronephrosis, focal parenchymal abnormality, or  calculi. Normal-appearing urinary bladder. Stomach/Bowel: Stomach normal in appearance for the degree of distention. Edema/inflammation in the mesenteric fat adjacent to a loop of ileum in the right mid abdomen. Small bowel otherwise normal in appearance. Diffuse colonic diverticulosis, most extensive in the sigmoid region, without evidence of acute diverticulitis. Normal appendix in the right mid abdomen. Vascular/Lymphatic: Minimal bilateral common iliac artery atherosclerosis. No visible atherosclerosis elsewhere. Widely patent visceral arteries. No pathologic lymphadenopathy. Reproductive: Mild median lobe prostate gland enlargement. Normal seminal vesicles. Calcification involving Vasa deferens. Other: None. Musculoskeletal: Degenerative changes involving the right sacroiliac joint. Degenerative disc disease and spondylosis L4-5 and L5-S1. No acute abnormality.  IMPRESSION: 1. Focal ileitis involving a loop of ileum in the right mid abdomen versus acute epiploic appendagitis. This would account for the patient's abdominal pain. 2. Diffuse colonic diverticulosis without evidence of acute diverticulitis currently. 3. Mild median lobe prostate gland enlargement. Electronically Signed   By: Hulan Saashomas  Lawrence M.D.   On: 03/07/2016 19:33   I have personally reviewed and evaluated these images and lab results as part of my medical decision-making.   EKG Interpretation None      MDM   Final diagnoses:  None    Mark Shah is a 60 y.o. male here with abdominal pain, nausea and already on cipro, flagyl for 3 days. Will get labs, lactate, UA, CT ab/pel to r/o perforation or strictures.   8:54 PM Pain controlled. CT showed no diverticulitis, just ileitis vs epiploic appendagitis. CBG was low and insulin pump turned off and he ate and drank well. Recommend high dose motrin, prn vicodin. Offered augmentin vs continue cipro/flagyl and patient wants to continue current meds. Will dc home     Mark Canalavid H Mahati Vajda, MD 03/07/16 2056

## 2016-03-07 NOTE — ED Notes (Signed)
Patient here with complaints of abd pain. 10/10 since Sunday. Currently taking Flagyl and Cipro. Hx of diverticulitis.

## 2016-03-07 NOTE — Discharge Instructions (Signed)
Take motrin for pain.   Stay hydrated.   Take vicodin for severe pain.   See your doctor.   Return to ER if you have severe pain, vomiting, fevers.

## 2016-03-07 NOTE — ED Notes (Signed)
Patient was alert, oriented and stable upon discharge. RN went over AVS and patient had no further questions.  

## 2016-08-27 DIAGNOSIS — G4733 Obstructive sleep apnea (adult) (pediatric): Secondary | ICD-10-CM | POA: Diagnosis not present

## 2016-08-30 DIAGNOSIS — G4733 Obstructive sleep apnea (adult) (pediatric): Secondary | ICD-10-CM | POA: Diagnosis not present

## 2016-09-04 DIAGNOSIS — G4733 Obstructive sleep apnea (adult) (pediatric): Secondary | ICD-10-CM | POA: Diagnosis not present

## 2016-10-01 DIAGNOSIS — E039 Hypothyroidism, unspecified: Secondary | ICD-10-CM | POA: Diagnosis not present

## 2016-10-08 DIAGNOSIS — E109 Type 1 diabetes mellitus without complications: Secondary | ICD-10-CM | POA: Diagnosis not present

## 2016-10-08 DIAGNOSIS — Z794 Long term (current) use of insulin: Secondary | ICD-10-CM | POA: Diagnosis not present

## 2016-10-08 DIAGNOSIS — E108 Type 1 diabetes mellitus with unspecified complications: Secondary | ICD-10-CM | POA: Diagnosis not present

## 2016-10-12 DIAGNOSIS — Z23 Encounter for immunization: Secondary | ICD-10-CM | POA: Diagnosis not present

## 2016-10-12 DIAGNOSIS — Z794 Long term (current) use of insulin: Secondary | ICD-10-CM | POA: Diagnosis not present

## 2016-10-12 DIAGNOSIS — E039 Hypothyroidism, unspecified: Secondary | ICD-10-CM | POA: Diagnosis not present

## 2016-10-12 DIAGNOSIS — E109 Type 1 diabetes mellitus without complications: Secondary | ICD-10-CM | POA: Diagnosis not present

## 2016-11-19 DIAGNOSIS — E109 Type 1 diabetes mellitus without complications: Secondary | ICD-10-CM | POA: Diagnosis not present

## 2016-11-19 DIAGNOSIS — L309 Dermatitis, unspecified: Secondary | ICD-10-CM | POA: Diagnosis not present

## 2016-12-10 DIAGNOSIS — L219 Seborrheic dermatitis, unspecified: Secondary | ICD-10-CM | POA: Diagnosis not present

## 2016-12-10 DIAGNOSIS — L710 Perioral dermatitis: Secondary | ICD-10-CM | POA: Diagnosis not present

## 2017-01-07 DIAGNOSIS — L219 Seborrheic dermatitis, unspecified: Secondary | ICD-10-CM | POA: Diagnosis not present

## 2017-01-07 DIAGNOSIS — L710 Perioral dermatitis: Secondary | ICD-10-CM | POA: Diagnosis not present

## 2017-01-18 DIAGNOSIS — E1065 Type 1 diabetes mellitus with hyperglycemia: Secondary | ICD-10-CM | POA: Diagnosis not present

## 2017-01-18 DIAGNOSIS — E109 Type 1 diabetes mellitus without complications: Secondary | ICD-10-CM | POA: Diagnosis not present

## 2017-01-18 DIAGNOSIS — E039 Hypothyroidism, unspecified: Secondary | ICD-10-CM | POA: Diagnosis not present

## 2017-01-18 DIAGNOSIS — Z794 Long term (current) use of insulin: Secondary | ICD-10-CM | POA: Diagnosis not present

## 2017-01-21 DIAGNOSIS — E109 Type 1 diabetes mellitus without complications: Secondary | ICD-10-CM | POA: Diagnosis not present

## 2017-01-21 DIAGNOSIS — E108 Type 1 diabetes mellitus with unspecified complications: Secondary | ICD-10-CM | POA: Diagnosis not present

## 2017-01-21 DIAGNOSIS — Z794 Long term (current) use of insulin: Secondary | ICD-10-CM | POA: Diagnosis not present

## 2017-03-18 DIAGNOSIS — E109 Type 1 diabetes mellitus without complications: Secondary | ICD-10-CM | POA: Diagnosis not present

## 2017-04-12 ENCOUNTER — Encounter (HOSPITAL_COMMUNITY): Payer: Self-pay | Admitting: Emergency Medicine

## 2017-04-12 ENCOUNTER — Emergency Department (HOSPITAL_COMMUNITY): Payer: BLUE CROSS/BLUE SHIELD

## 2017-04-12 ENCOUNTER — Emergency Department (HOSPITAL_COMMUNITY)
Admission: EM | Admit: 2017-04-12 | Discharge: 2017-04-13 | Disposition: A | Discharge status: Home / Self Care | Payer: BLUE CROSS/BLUE SHIELD | Attending: Emergency Medicine | Admitting: Emergency Medicine

## 2017-04-12 DIAGNOSIS — Z79899 Other long term (current) drug therapy: Secondary | ICD-10-CM | POA: Diagnosis not present

## 2017-04-12 DIAGNOSIS — E039 Hypothyroidism, unspecified: Secondary | ICD-10-CM | POA: Diagnosis not present

## 2017-04-12 DIAGNOSIS — Z87891 Personal history of nicotine dependence: Secondary | ICD-10-CM | POA: Diagnosis not present

## 2017-04-12 DIAGNOSIS — I10 Essential (primary) hypertension: Secondary | ICD-10-CM | POA: Diagnosis not present

## 2017-04-12 DIAGNOSIS — R1084 Generalized abdominal pain: Secondary | ICD-10-CM | POA: Diagnosis present

## 2017-04-12 DIAGNOSIS — R109 Unspecified abdominal pain: Secondary | ICD-10-CM | POA: Diagnosis not present

## 2017-04-12 DIAGNOSIS — E109 Type 1 diabetes mellitus without complications: Secondary | ICD-10-CM | POA: Diagnosis not present

## 2017-04-12 DIAGNOSIS — K760 Fatty (change of) liver, not elsewhere classified: Secondary | ICD-10-CM | POA: Diagnosis not present

## 2017-04-12 DIAGNOSIS — R1011 Right upper quadrant pain: Secondary | ICD-10-CM | POA: Diagnosis not present

## 2017-04-12 LAB — CBC
HEMATOCRIT: 38.8 % — AB (ref 39.0–52.0)
HEMOGLOBIN: 13.7 g/dL (ref 13.0–17.0)
MCH: 33.6 pg (ref 26.0–34.0)
MCHC: 35.3 g/dL (ref 30.0–36.0)
MCV: 95.1 fL (ref 78.0–100.0)
Platelets: 212 10*3/uL (ref 150–400)
RBC: 4.08 MIL/uL — AB (ref 4.22–5.81)
RDW: 11.9 % (ref 11.5–15.5)
WBC: 8.1 10*3/uL (ref 4.0–10.5)

## 2017-04-12 LAB — COMPREHENSIVE METABOLIC PANEL
ALT: 22 U/L (ref 17–63)
ANION GAP: 10 (ref 5–15)
AST: 21 U/L (ref 15–41)
Albumin: 4.1 g/dL (ref 3.5–5.0)
Alkaline Phosphatase: 61 U/L (ref 38–126)
BUN: 10 mg/dL (ref 6–20)
CALCIUM: 8.8 mg/dL — AB (ref 8.9–10.3)
CHLORIDE: 102 mmol/L (ref 101–111)
CO2: 25 mmol/L (ref 22–32)
Creatinine, Ser: 1.03 mg/dL (ref 0.61–1.24)
GFR calc non Af Amer: >60 mL/min (ref >60)
GLUCOSE: 68 mg/dL (ref 65–99)
POTASSIUM: 3.4 mmol/L — AB (ref 3.5–5.1)
SODIUM: 137 mmol/L (ref 135–145)
Total Bilirubin: 0.7 mg/dL (ref 0.3–1.2)
Total Protein: 7.3 g/dL (ref 6.5–8.1)

## 2017-04-12 LAB — LIPASE, BLOOD: LIPASE: 33 U/L (ref 11–51)

## 2017-04-12 MED ORDER — ONDANSETRON HCL 4 MG/2ML IJ SOLN
4.0000 mg | Freq: Once | INTRAMUSCULAR | Status: AC
  Administered 2017-04-13: 4 mg via INTRAVENOUS
  Filled 2017-04-12: qty 2

## 2017-04-12 MED ORDER — MORPHINE SULFATE (PF) 2 MG/ML IV SOLN
4.0000 mg | Freq: Once | INTRAVENOUS | Status: AC
  Administered 2017-04-13: 4 mg via INTRAVENOUS
  Filled 2017-04-12: qty 2

## 2017-04-12 MED ORDER — SODIUM CHLORIDE 0.9 % IV BOLUS (SEPSIS)
1000.0000 mL | Freq: Once | INTRAVENOUS | Status: AC
  Administered 2017-04-13: 1000 mL via INTRAVENOUS

## 2017-04-12 MED ORDER — IOPAMIDOL (ISOVUE-300) INJECTION 61%
INTRAVENOUS | Status: AC
  Filled 2017-04-12: qty 100

## 2017-04-12 MED ORDER — IOPAMIDOL (ISOVUE-300) INJECTION 61%
100.0000 mL | Freq: Once | INTRAVENOUS | Status: AC | PRN
  Administered 2017-04-13: 100 mL via INTRAVENOUS

## 2017-04-12 NOTE — ED Triage Notes (Signed)
Patient c/o right sided abdominal pain, sudden onset about 30 mins ago. Pt reports about 6 loose stool occurrences throughout the day. Denies any blood in stool or urinary complaints. Pt c/o nausea.

## 2017-04-12 NOTE — ED Provider Notes (Signed)
[facility]-EMERGENCY DEPT Provider Note   CSN: [id number] Arrival date & time: 04/12/17  2222  By signing my name below, I, Mark Shah, attest that this documentation has been prepared under the direction and in the presence of Dione BoozeGlick, Deamber Buckhalter, MD. Electronically Signed: Karren CobbleNy'Kea Shah, ED Scribe. 04/13/17. 12:16 AM.  History   Chief Complaint Chief Complaint  Patient presents with  . Abdominal Pain   The history is provided by the patient. No language interpreter was used.    HPI Comments: Mark Shah is a 61 y.o. male with a PMHx of HTN, DM I, HLD, and hypothyroidism, who presents to the Emergency Department complaining of sudden onset, persistent generalized abdominal pain that began one hour PTA, he rates the severity of his pain an 8/10. Pt notes the pain intermittently radiates from right sided chest pain to right sided back pain, as well as associated decreased appetite, diarrhea and nausea. Pt reports he has been experiencing diarrhea al day but since the onset of his pain he has not had a bowel. He was previously dx with epiploic appendagitis and diverticulitis, but notes his symptoms are similar with these previous conditions. No treatment tried PTA.  Denies exacerbation of pain when eating. Denies emesis, fever, or any other acute associated symptoms noted at this time.   Past Medical History:  Diagnosis Date  . Adhesive capsulitis    in shoulders  . Diabetes mellitus without complication (HCC)   . Diverticulitis   . Dyslipidemia   . ED (erectile dysfunction)   . HTN (hypertension)   . Hyperlipidemia   . Hypothyroidism   . PONV (postoperative nausea and vomiting)    Patient Active Problem List   Diagnosis Date Noted  . Type 1 diabetes mellitus (HCC) 08/28/2013  . Diverticulitis 08/28/2013  . Hypothyroid 08/28/2013   Past Surgical History:  Procedure Laterality Date  . BACK SURGERY    . CERVICAL DISC SURGERY    . left hand      Home Medications    Prior to  Admission medications   Medication Sig Start Date End Date Taking? Authorizing Provider  chlorpheniramine-HYDROcodone (TUSSIONEX) 10-8 MG/5ML SUER TAKE 1 TEASPOONFUL Y MOUTH EVERY 12 HOURS AS NEEDED FOR COUGH 12/07/15   [provider]  ciprofloxacin (CIPRO) 500 MG tablet Take 500 mg by mouth 2 (two) times daily. ABT Start Date 03/04/16 & End Date 03/14/16. 03/05/16   [provider]  fluticasone (FLONASE) 50 MCG/ACT nasal spray USE 1 SPRAY IN EACH NOSTRIL DAILY PRN FOR ALLERGIES 12/28/15   [provider]  Glucagon, rDNA, (GLUCAGON EMERGENCY IJ) Inject 1 mg as directed as needed (as directed for diabetes.Marland Kitchen.).    [provider]  HYDROcodone-acetaminophen (NORCO/VICODIN) 5-325 MG tablet Take 1 tablet by mouth every 6 (six) hours as needed. 03/07/16   Charlynne PanderYao, Keino Placencia Hsienta, MD  ibuprofen (ADVIL,MOTRIN) 800 MG tablet Take 1 tablet (800 mg total) by mouth 3 (three) times daily. 03/07/16   Charlynne PanderYao, Lenward Able Hsienta, MD  insulin glargine (LANTUS) 100 UNIT/ML injection Inject 11 Units into the skin 2 (two) times daily. ONLY IF INSULIN PUMP CAN NOT BE USED    [provider]  insulin lispro (HUMALOG) 100 UNIT/ML injection Inject into the skin as directed. As directed with insulin pump. No unites given.    [provider]  loratadine (CLARITIN) 10 MG tablet Take 10 mg by mouth daily as needed for allergies.    [provider]  metroNIDAZOLE (FLAGYL) 500 MG tablet Take 500 mg by mouth 2 (  two) times daily. ABT Start Date 03/05/16 & End Date 03/15/16. 03/05/16   [provider]  sildenafil (VIAGRA) 100 MG tablet Take 100 mg by mouth daily as needed for erectile dysfunction.    [provider]  SYNTHROID 200 MCG tablet Take 200 mcg by mouth daily before breakfast.  02/09/16   [provider]  telmisartan (MICARDIS) 40 MG tablet Take 40 mg by mouth daily.    [provider]  ZETIA 10 MG tablet Take 1 tablet by mouth daily. 06/08/13   [provider]  zolpidem (AMBIEN) 10 MG tablet Take 10 mg by mouth at bedtime as needed for sleep.  02/20/16   [provider]   Family History Family History  Problem Relation Age of Onset  . Hyperlipidemia Mother   . Asthma Mother   . Asthma Father   . COPD Father   . Cancer - Lung Maternal Grandfather   . Hyperlipidemia Paternal Grandfather   . Stroke Paternal Grandfather   . CVA Paternal Grandfather    Social History Social History  Substance Use Topics  . Smoking status: Former Games developer  . Smokeless tobacco: Never Used  . Alcohol use Yes   Allergies   Erythromycin  Review of Systems Review of Systems  Constitutional: Positive for appetite change. Negative for fever.  Cardiovascular: Positive for chest pain.  Gastrointestinal: Positive for abdominal pain, diarrhea and nausea. Negative for vomiting.  Musculoskeletal: Positive for back pain.  All other systems reviewed and are negative.  Physical Exam Updated Vital Signs BP (!) 173/89 (BP Location: Left Arm)   Pulse 84   Temp 98.5 F (36.9 C) (Oral)   Resp 18   Ht 5\' 10"  (1.778 m)   Wt 194 lb (88 kg)   SpO2 100%   BMI 27.84 kg/m   Physical Exam  Constitutional: He is oriented to person, place, and time. He appears well-developed and well-nourished.  HENT:  Head: Normocephalic and atraumatic.  Eyes: EOM are normal. Pupils are equal, round, and reactive to light.  Neck: Normal range of motion. Neck supple. No JVD present.  Cardiovascular: Normal rate, regular rhythm and normal heart sounds.   No murmur heard. Pulmonary/Chest: Effort normal and breath sounds normal. He has no wheezes. He has no rales. He exhibits no tenderness.  Abdominal: Soft. He exhibits no distension and no mass. There is tenderness. There is no rebound and no guarding.  RUQ tenderness. Negative Murphy's sign. Bowel sounds are decreased.   Musculoskeletal: Normal range of motion. He exhibits no edema.  Lymphadenopathy:    He has no  cervical adenopathy.  Neurological: He is alert and oriented to person, place, and time. No cranial nerve deficit. He exhibits normal muscle tone. Coordination normal.  Skin: Skin is warm and dry. No rash noted.  Psychiatric: He has a normal mood and affect. His behavior is normal. Judgment and thought content normal.  Nursing note and vitals reviewed.  ED Treatments / Results  DIAGNOSTIC STUDIES: Oxygen Saturation is 100% on RA, normal by my interpretation.   COORDINATION OF CARE: 11:43 PM-Discussed next steps with pt. Pt verbalized understanding and is agreeable with the plan.   Labs (all labs ordered are listed, but only abnormal results are displayed) Labs Reviewed  COMPREHENSIVE METABOLIC PANEL - Abnormal; Notable for the following:       Result Value   Potassium 3.4 (*)    Calcium 8.8 (*)    All other components within normal limits  CBC - Abnormal;  Notable for the following:    RBC 4.08 (*)    HCT 38.8 (*)    All other components within normal limits  URINALYSIS, ROUTINE W REFLEX MICROSCOPIC - Abnormal; Notable for the following:    Color, Urine COLORLESS (*)    All other components within normal limits  LIPASE, BLOOD    Radiology US Abdomen Complete  Result Date: 04/13/2017 CLINICAL DATA:  Abdominal pain. EXAM: ABDOMEN ULTRASOUND COMPLETE COMPARISON:  CT abdomen/pelvis earlier this day FINDINGS: Gallbladder: Physiologically distended. No gallstones or wall thickening visualized. No sonographic Murphy sign noted by sonographer. Common bile duct: Diameter: 3 mm, normal. Liver: No focal lesion identified. Borderline increased in parenchymal echogenicity. Normal directional flow in the main portal vein. IVC: No abnormality visualized. Pancreas: Obscured by bowel gas. Spleen: Size and appearance within normal limits. Right Kidney: Length: 9.0 cm. Echogenicity within normal limits. No mass or hydronephrosis visualized. Left Kidney: Length: 10.6 cm. Echogenicity within normal  limits. No mass or hydronephrosis visualized. Abdominal aorta: Obscured by bowel gas and not well visualized sonographically. Other findings: None.  No ascites. IMPRESSION: 1. No acute abnormality or explanation for abdominal pain. 2. Equivocal mild hepatic steatosis. Electronically Signed   By: Rubye Oaks M.D.   On: 04/13/2017 02:47   Ct Abdomen Pelvis W Contrast  Result Date: 04/13/2017 CLINICAL DATA:  Right-sided abdominal pain.  Nausea. EXAM: CT ABDOMEN AND PELVIS WITH CONTRAST TECHNIQUE: Multidetector CT imaging of the abdomen and pelvis was performed using the standard protocol following bolus administration of intravenous contrast. CONTRAST:  ISOVUE-300 IOPAMIDOL (ISOVUE-300) INJECTION 61% COMPARISON:  CT 03/07/2016 FINDINGS: Lower chest: The lung bases are clear. Hepatobiliary: No focal liver abnormality is seen. No gallstones, gallbladder wall thickening, or biliary dilatation. Pancreas: No ductal dilatation or inflammation. Spleen: Normal in size without focal abnormality. Adrenals/Urinary Tract: Minimal cortical scarring in the lower right kidney. No hydronephrosis, perinephric edema or focal renal lesion. No urolithiasis. Urinary bladder is physiologically distended. Adrenal glands are normal. Stomach/Bowel: Colonic diverticulosis in the sigmoid colon without acute diverticulitis. There is a diverticulum of the distal ileum without inflammation. Normal appendix. No small bowel dilatation or wall thickening. The stomach is decompressed. Vascular/Lymphatic: No significant vascular findings are present. No enlarged abdominal or pelvic lymph nodes. Reproductive: Prostate gland is normal in size. There are vas deferens calcifications, commonly seen with diabetes. Other: No free air, free fluid, or intra-abdominal fluid collection. Musculoskeletal: There are no acute or suspicious osseous abnormalities. Degenerative disc disease at L4-L5 and L5-S1. 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 IMPRESSION: 1. No acute abnormality or explanation for right-sided abdominal pain. 2. Sigmoid colonic diverticulosis without inflammation. Diverticulum from the terminal ileum with resolved inflammation from prior exam. Electronically Signed   By: Rubye Oaks M.D.   On: 04/13/2017 01:33    Procedures Procedures (including critical care time)  Medications Ordered in ED Medications  sodium chloride 0.9 % bolus 1,000 mL (0 mLs Intravenous Stopped 04/13/17 0140)  morphine 2 MG/ML injection 4 mg (4 mg Intravenous Given 04/13/17 0032)  ondansetron (ZOFRAN) injection 4 mg (4 mg Intravenous Given 04/13/17 0032)  iopamidol (ISOVUE-300) 61 % injection 100 mL (100 mLs Intravenous Contrast Given 04/13/17 0059)  morphine 2 MG/ML injection 4 mg (4 mg Intravenous Given 04/13/17 0305)     Initial Impression / Assessment and Plan / ED Course  I have reviewed the triage vital signs and the nursing notes.  Pertinent labs & imaging results that were available during my care of  the patient were reviewed by me and considered in my medical decision making (see chart for details).  Right-sided abdominal pain of uncertain cause. Tenderness does seem to localize to the right upper quadrant, but with negative Murphy sign. Workup is initiated including metabolic panel, lipase, CT of abdomen and pelvis. Old records are reviewed, and he does have prior admission for diverticulitis, and an ED visit for epiploic appendagitis. He'll be given IV fluids, morphine, ondansetron.  He required 2 doses of morphine to get pain relief. Laboratory workup is unremarkable and CT shows no obvious cause for pain. No evidence of diverticulitis, urolithiasis, pancreatitis. He is sent for abdominal ultrasound which is also unremarkable. I've discussed this with patient. He is much more comfortable at this point. He is discharged with prescription for a small number of oxycodone-acetaminophen tablets and also prescription for ondansetron with  instructions to return if his symptoms are worsening or if he still is having significant pain after 24 hours.  Final Clinical Impressions(s) / ED Diagnoses   Final diagnoses:  RUQ abdominal pain    New Prescriptions New Prescriptions   ONDANSETRON (ZOFRAN) 4 MG TABLET    Take 1 tablet (4 mg total) by mouth every 6 (six) hours as needed for nausea.   OXYCODONE-ACETAMINOPHEN (PERCOCET) 5-325 MG TABLET    Take 1 tablet by mouth every 4 (four) hours as needed for moderate pain.   I personally performed the services described in this documentation, which was scribed in my presence. The recorded information has been reviewed and is accurate.       Dione Booze, MD 04/13/17 [phone]

## 2017-04-13 ENCOUNTER — Emergency Department (HOSPITAL_COMMUNITY): Payer: BLUE CROSS/BLUE SHIELD

## 2017-04-13 DIAGNOSIS — K760 Fatty (change of) liver, not elsewhere classified: Secondary | ICD-10-CM | POA: Diagnosis not present

## 2017-04-13 DIAGNOSIS — R109 Unspecified abdominal pain: Secondary | ICD-10-CM | POA: Diagnosis not present

## 2017-04-13 LAB — URINALYSIS, ROUTINE W REFLEX MICROSCOPIC
BILIRUBIN URINE: NEGATIVE
GLUCOSE, UA: NEGATIVE mg/dL
HGB URINE DIPSTICK: NEGATIVE
Ketones, ur: NEGATIVE mg/dL
Leukocytes, UA: NEGATIVE
Nitrite: NEGATIVE
Protein, ur: NEGATIVE mg/dL
SPECIFIC GRAVITY, URINE: 1.005 (ref 1.005–1.030)
pH: 7 (ref 5.0–8.0)

## 2017-04-13 MED ORDER — ONDANSETRON HCL 4 MG PO TABS: 4.0000 mg | ORAL_TABLET | Freq: Four times a day (QID) | ORAL | 0 refills | Status: DC | PRN

## 2017-04-13 MED ORDER — OXYCODONE-ACETAMINOPHEN 5-325 MG PO TABS: 1.0000 | ORAL_TABLET | ORAL | 0 refills | Status: DC | PRN

## 2017-04-13 MED ORDER — MORPHINE SULFATE (PF) 2 MG/ML IV SOLN
4.0000 mg | Freq: Once | INTRAVENOUS | Status: AC
  Administered 2017-04-13: 4 mg via INTRAVENOUS
  Filled 2017-04-13: qty 2

## 2017-04-13 NOTE — Discharge Instructions (Signed)
The cause for your pain is not clear. Your CT scan and ultrasound did not show the cause of the pain. Your blood work was all normal. You have been given medication for pain and nausea. If pain gets worse to where it is not being adequately controlled at home, then return to the emergency department for reevaluation. If pain is not improving after 24 hours, you should be reevaluated. Sometimes initial tests do not show the cause of pain which is obvious on later evaluations.

## 2017-04-29 DIAGNOSIS — E1065 Type 1 diabetes mellitus with hyperglycemia: Secondary | ICD-10-CM | POA: Diagnosis not present

## 2017-04-29 DIAGNOSIS — Z794 Long term (current) use of insulin: Secondary | ICD-10-CM | POA: Diagnosis not present

## 2017-04-29 DIAGNOSIS — E039 Hypothyroidism, unspecified: Secondary | ICD-10-CM | POA: Diagnosis not present

## 2017-04-29 DIAGNOSIS — E109 Type 1 diabetes mellitus without complications: Secondary | ICD-10-CM | POA: Diagnosis not present

## 2017-04-30 DIAGNOSIS — H2513 Age-related nuclear cataract, bilateral: Secondary | ICD-10-CM | POA: Diagnosis not present

## 2017-04-30 DIAGNOSIS — E119 Type 2 diabetes mellitus without complications: Secondary | ICD-10-CM | POA: Diagnosis not present

## 2017-05-01 DIAGNOSIS — E039 Hypothyroidism, unspecified: Secondary | ICD-10-CM | POA: Diagnosis not present

## 2017-05-01 DIAGNOSIS — I10 Essential (primary) hypertension: Secondary | ICD-10-CM | POA: Diagnosis not present

## 2017-05-01 DIAGNOSIS — E109 Type 1 diabetes mellitus without complications: Secondary | ICD-10-CM | POA: Diagnosis not present

## 2017-05-01 DIAGNOSIS — Z125 Encounter for screening for malignant neoplasm of prostate: Secondary | ICD-10-CM | POA: Diagnosis not present

## 2017-05-01 DIAGNOSIS — E1065 Type 1 diabetes mellitus with hyperglycemia: Secondary | ICD-10-CM | POA: Diagnosis not present

## 2017-05-02 DIAGNOSIS — E109 Type 1 diabetes mellitus without complications: Secondary | ICD-10-CM | POA: Diagnosis not present

## 2017-05-02 DIAGNOSIS — E108 Type 1 diabetes mellitus with unspecified complications: Secondary | ICD-10-CM | POA: Diagnosis not present

## 2017-05-02 DIAGNOSIS — Z794 Long term (current) use of insulin: Secondary | ICD-10-CM | POA: Diagnosis not present

## 2017-06-24 DIAGNOSIS — E109 Type 1 diabetes mellitus without complications: Secondary | ICD-10-CM | POA: Diagnosis not present

## 2017-07-11 DIAGNOSIS — E109 Type 1 diabetes mellitus without complications: Secondary | ICD-10-CM | POA: Diagnosis not present

## 2017-07-18 DIAGNOSIS — E039 Hypothyroidism, unspecified: Secondary | ICD-10-CM | POA: Diagnosis not present

## 2017-07-18 DIAGNOSIS — E109 Type 1 diabetes mellitus without complications: Secondary | ICD-10-CM | POA: Diagnosis not present

## 2017-07-18 DIAGNOSIS — E1065 Type 1 diabetes mellitus with hyperglycemia: Secondary | ICD-10-CM | POA: Diagnosis not present

## 2017-07-18 DIAGNOSIS — Z794 Long term (current) use of insulin: Secondary | ICD-10-CM | POA: Diagnosis not present

## 2017-09-06 DIAGNOSIS — H938X1 Other specified disorders of right ear: Secondary | ICD-10-CM | POA: Diagnosis not present

## 2017-09-06 DIAGNOSIS — J0100 Acute maxillary sinusitis, unspecified: Secondary | ICD-10-CM | POA: Diagnosis not present

## 2017-09-10 DIAGNOSIS — G4733 Obstructive sleep apnea (adult) (pediatric): Secondary | ICD-10-CM | POA: Diagnosis not present

## 2017-10-04 DIAGNOSIS — G4733 Obstructive sleep apnea (adult) (pediatric): Secondary | ICD-10-CM | POA: Diagnosis not present

## 2017-10-16 DIAGNOSIS — E109 Type 1 diabetes mellitus without complications: Secondary | ICD-10-CM | POA: Diagnosis not present

## 2017-12-11 DIAGNOSIS — E109 Type 1 diabetes mellitus without complications: Secondary | ICD-10-CM | POA: Diagnosis not present

## 2017-12-26 DIAGNOSIS — E109 Type 1 diabetes mellitus without complications: Secondary | ICD-10-CM | POA: Diagnosis not present

## 2018-01-04 DIAGNOSIS — J209 Acute bronchitis, unspecified: Secondary | ICD-10-CM | POA: Diagnosis not present

## 2018-01-23 DIAGNOSIS — R300 Dysuria: Secondary | ICD-10-CM | POA: Diagnosis not present

## 2018-01-24 DIAGNOSIS — N342 Other urethritis: Secondary | ICD-10-CM | POA: Diagnosis not present

## 2018-01-24 DIAGNOSIS — Z202 Contact with and (suspected) exposure to infections with a predominantly sexual mode of transmission: Secondary | ICD-10-CM | POA: Diagnosis not present

## 2018-02-04 DIAGNOSIS — N411 Chronic prostatitis: Secondary | ICD-10-CM | POA: Diagnosis not present

## 2018-02-04 DIAGNOSIS — R300 Dysuria: Secondary | ICD-10-CM | POA: Diagnosis not present

## 2018-02-20 DIAGNOSIS — E039 Hypothyroidism, unspecified: Secondary | ICD-10-CM | POA: Diagnosis not present

## 2018-02-20 DIAGNOSIS — Z794 Long term (current) use of insulin: Secondary | ICD-10-CM | POA: Diagnosis not present

## 2018-02-20 DIAGNOSIS — E1065 Type 1 diabetes mellitus with hyperglycemia: Secondary | ICD-10-CM | POA: Diagnosis not present

## 2018-03-11 DIAGNOSIS — E109 Type 1 diabetes mellitus without complications: Secondary | ICD-10-CM | POA: Diagnosis not present

## 2018-04-16 DIAGNOSIS — E109 Type 1 diabetes mellitus without complications: Secondary | ICD-10-CM | POA: Diagnosis not present

## 2018-04-17 DIAGNOSIS — R079 Chest pain, unspecified: Secondary | ICD-10-CM | POA: Diagnosis not present

## 2018-04-25 DIAGNOSIS — I10 Essential (primary) hypertension: Secondary | ICD-10-CM | POA: Diagnosis not present

## 2018-04-25 DIAGNOSIS — E119 Type 2 diabetes mellitus without complications: Secondary | ICD-10-CM | POA: Diagnosis not present

## 2018-04-25 DIAGNOSIS — R079 Chest pain, unspecified: Secondary | ICD-10-CM | POA: Diagnosis not present

## 2018-04-29 ENCOUNTER — Ambulatory Visit
Admission: RE | Admit: 2018-04-29 | Discharge: 2018-04-29 | Disposition: A | Discharge status: Home / Self Care | Payer: BLUE CROSS/BLUE SHIELD | Source: Ambulatory Visit | Attending: Cardiology | Admitting: Cardiology

## 2018-04-29 ENCOUNTER — Other Ambulatory Visit: Payer: Self-pay | Admitting: Cardiology

## 2018-04-29 DIAGNOSIS — R079 Chest pain, unspecified: Secondary | ICD-10-CM

## 2018-04-29 DIAGNOSIS — R0609 Other forms of dyspnea: Secondary | ICD-10-CM | POA: Diagnosis not present

## 2018-05-13 ENCOUNTER — Ambulatory Visit (HOSPITAL_COMMUNITY)
Admission: RE | Admit: 2018-05-13 | Discharge: 2018-05-13 | Disposition: A | Discharge status: Home / Self Care | Payer: BLUE CROSS/BLUE SHIELD | Source: Ambulatory Visit | Attending: Cardiology | Admitting: Cardiology

## 2018-05-13 ENCOUNTER — Ambulatory Visit (HOSPITAL_COMMUNITY)
Admission: RE | Disposition: A | Discharge status: Home / Self Care | Payer: Self-pay | Source: Ambulatory Visit | Attending: Cardiology

## 2018-05-13 ENCOUNTER — Other Ambulatory Visit: Payer: Self-pay

## 2018-05-13 DIAGNOSIS — E1065 Type 1 diabetes mellitus with hyperglycemia: Secondary | ICD-10-CM

## 2018-05-13 DIAGNOSIS — E039 Hypothyroidism, unspecified: Secondary | ICD-10-CM | POA: Insufficient documentation

## 2018-05-13 DIAGNOSIS — I10 Essential (primary) hypertension: Secondary | ICD-10-CM | POA: Insufficient documentation

## 2018-05-13 DIAGNOSIS — Z7982 Long term (current) use of aspirin: Secondary | ICD-10-CM | POA: Diagnosis not present

## 2018-05-13 DIAGNOSIS — I200 Unstable angina: Secondary | ICD-10-CM | POA: Diagnosis not present

## 2018-05-13 DIAGNOSIS — E119 Type 2 diabetes mellitus without complications: Secondary | ICD-10-CM | POA: Insufficient documentation

## 2018-05-13 DIAGNOSIS — R9439 Abnormal result of other cardiovascular function study: Secondary | ICD-10-CM | POA: Diagnosis present

## 2018-05-13 DIAGNOSIS — Z794 Long term (current) use of insulin: Secondary | ICD-10-CM | POA: Insufficient documentation

## 2018-05-13 DIAGNOSIS — Z955 Presence of coronary angioplasty implant and graft: Secondary | ICD-10-CM

## 2018-05-13 DIAGNOSIS — I25110 Atherosclerotic heart disease of native coronary artery with unstable angina pectoris: Secondary | ICD-10-CM | POA: Insufficient documentation

## 2018-05-13 DIAGNOSIS — E1069 Type 1 diabetes mellitus with other specified complication: Secondary | ICD-10-CM | POA: Diagnosis present

## 2018-05-13 DIAGNOSIS — E785 Hyperlipidemia, unspecified: Secondary | ICD-10-CM | POA: Insufficient documentation

## 2018-05-13 HISTORY — DX: Type 1 diabetes mellitus with other specified complication: E78.5

## 2018-05-13 HISTORY — PX: LEFT HEART CATH AND CORONARY ANGIOGRAPHY: CATH118249

## 2018-05-13 HISTORY — DX: Essential (primary) hypertension: I10

## 2018-05-13 HISTORY — DX: Type 1 diabetes mellitus with other specified complication: E10.69

## 2018-05-13 HISTORY — PX: CORONARY STENT INTERVENTION: CATH118234

## 2018-05-13 HISTORY — DX: Type 1 diabetes mellitus with hyperglycemia: E10.65

## 2018-05-13 LAB — CBC
HCT: 36.2 % — ABNORMAL LOW (ref 39.0–52.0)
Hemoglobin: 12.7 g/dL — ABNORMAL LOW (ref 13.0–17.0)
MCH: 33.3 pg (ref 26.0–34.0)
MCHC: 35.1 g/dL (ref 30.0–36.0)
MCV: 95 fL (ref 78.0–100.0)
PLATELETS: 224 10*3/uL (ref 150–400)
RBC: 3.81 MIL/uL — AB (ref 4.22–5.81)
RDW: 11.4 % — AB (ref 11.5–15.5)
WBC: 3.9 10*3/uL — AB (ref 4.0–10.5)

## 2018-05-13 LAB — GLUCOSE, CAPILLARY
GLUCOSE-CAPILLARY: 185 mg/dL — AB (ref 70–99)
Glucose-Capillary: 239 mg/dL — ABNORMAL HIGH (ref 70–99)

## 2018-05-13 LAB — BASIC METABOLIC PANEL
Anion gap: 9 (ref 5–15)
BUN: 6 mg/dL — ABNORMAL LOW (ref 8–23)
CALCIUM: 8.6 mg/dL — AB (ref 8.9–10.3)
CO2: 23 mmol/L (ref 22–32)
Chloride: 103 mmol/L (ref 98–111)
Creatinine, Ser: 1.03 mg/dL (ref 0.61–1.24)
GFR calc Af Amer: >60 mL/min (ref >60)
Glucose, Bld: 246 mg/dL — ABNORMAL HIGH (ref 70–99)
POTASSIUM: 4.1 mmol/L (ref 3.5–5.1)
SODIUM: 135 mmol/L (ref 135–145)

## 2018-05-13 LAB — POCT ACTIVATED CLOTTING TIME
ACTIVATED CLOTTING TIME: 252 s
ACTIVATED CLOTTING TIME: 285 s

## 2018-05-13 SURGERY — LEFT HEART CATH AND CORONARY ANGIOGRAPHY
Anesthesia: LOCAL

## 2018-05-13 MED ORDER — SODIUM CHLORIDE 0.9 % WEIGHT BASED INFUSION: 1.0000 mL/kg/h | INTRAVENOUS | Status: DC

## 2018-05-13 MED ORDER — MORPHINE SULFATE (PF) 2 MG/ML IV SOLN: 2.0000 mg | INTRAVENOUS | Status: DC | PRN

## 2018-05-13 MED ORDER — SODIUM CHLORIDE 0.9 % IV SOLN: 250.0000 mL | INTRAVENOUS | Status: DC | PRN

## 2018-05-13 MED ORDER — HEPARIN SODIUM (PORCINE) 1000 UNIT/ML IJ SOLN
INTRAMUSCULAR | Status: DC | PRN
  Administered 2018-05-13: 3000 [IU] via INTRAVENOUS
  Administered 2018-05-13 (×2): 4000 [IU] via INTRAVENOUS

## 2018-05-13 MED ORDER — FENTANYL CITRATE (PF) 100 MCG/2ML IJ SOLN
INTRAMUSCULAR | Status: AC
  Filled 2018-05-13: qty 2

## 2018-05-13 MED ORDER — CLOPIDOGREL BISULFATE 75 MG PO TABS: 75.0000 mg | ORAL_TABLET | Freq: Every day | ORAL | Status: DC

## 2018-05-13 MED ORDER — MIDAZOLAM HCL 2 MG/2ML IJ SOLN
INTRAMUSCULAR | Status: AC
  Filled 2018-05-13: qty 2

## 2018-05-13 MED ORDER — CLOPIDOGREL BISULFATE 75 MG PO TABS: 75.0000 mg | ORAL_TABLET | Freq: Every day | ORAL | 0 refills | Status: DC

## 2018-05-13 MED ORDER — ASPIRIN 81 MG PO CHEW
81.0000 mg | CHEWABLE_TABLET | ORAL | Status: AC
  Administered 2018-05-13: 81 mg via ORAL

## 2018-05-13 MED ORDER — CLOPIDOGREL BISULFATE 300 MG PO TABS
ORAL_TABLET | ORAL | Status: AC
  Filled 2018-05-13: qty 1

## 2018-05-13 MED ORDER — NITROGLYCERIN 1 MG/10 ML FOR IR/CATH LAB
INTRA_ARTERIAL | Status: DC | PRN
  Administered 2018-05-13 (×2): 200 ug via INTRACORONARY

## 2018-05-13 MED ORDER — SODIUM CHLORIDE 0.9 % IV SOLN: INTRAVENOUS | Status: AC

## 2018-05-13 MED ORDER — FENTANYL CITRATE (PF) 100 MCG/2ML IJ SOLN
INTRAMUSCULAR | Status: DC | PRN
  Administered 2018-05-13 (×3): 25 ug via INTRAVENOUS

## 2018-05-13 MED ORDER — MORPHINE SULFATE (PF) 10 MG/ML IV SOLN
INTRAVENOUS | Status: DC | PRN
  Administered 2018-05-13: 2 mg via INTRAVENOUS

## 2018-05-13 MED ORDER — MIDAZOLAM HCL 2 MG/2ML IJ SOLN
INTRAMUSCULAR | Status: DC | PRN
  Administered 2018-05-13 (×3): 1 mg via INTRAVENOUS

## 2018-05-13 MED ORDER — NITROGLYCERIN 0.4 MG SL SUBL
SUBLINGUAL_TABLET | SUBLINGUAL | Status: AC
  Filled 2018-05-13: qty 1

## 2018-05-13 MED ORDER — ASPIRIN 81 MG PO CHEW
CHEWABLE_TABLET | ORAL | Status: AC
  Filled 2018-05-13: qty 1

## 2018-05-13 MED ORDER — CLOPIDOGREL BISULFATE 300 MG PO TABS
ORAL_TABLET | ORAL | Status: DC | PRN
  Administered 2018-05-13: 300 mg via ORAL

## 2018-05-13 MED ORDER — SODIUM CHLORIDE 0.9% FLUSH: 3.0000 mL | INTRAVENOUS | Status: DC | PRN

## 2018-05-13 MED ORDER — VERAPAMIL HCL 2.5 MG/ML IV SOLN
INTRAVENOUS | Status: AC
  Filled 2018-05-13: qty 2

## 2018-05-13 MED ORDER — SODIUM CHLORIDE 0.9% FLUSH: 3.0000 mL | Freq: Two times a day (BID) | INTRAVENOUS | Status: DC

## 2018-05-13 MED ORDER — ONDANSETRON HCL 4 MG/2ML IJ SOLN: 4.0000 mg | Freq: Four times a day (QID) | INTRAMUSCULAR | Status: DC | PRN

## 2018-05-13 MED ORDER — NITROGLYCERIN 0.4 MG SL SUBL
SUBLINGUAL_TABLET | SUBLINGUAL | Status: DC | PRN
  Administered 2018-05-13: .4 mg via SUBLINGUAL

## 2018-05-13 MED ORDER — ASPIRIN EC 81 MG PO TBEC: 81.0000 mg | DELAYED_RELEASE_TABLET | Freq: Every day | ORAL | 0 refills | Status: AC

## 2018-05-13 MED ORDER — HEPARIN SODIUM (PORCINE) 1000 UNIT/ML IJ SOLN
INTRAMUSCULAR | Status: AC
  Filled 2018-05-13: qty 1

## 2018-05-13 MED ORDER — HEPARIN (PORCINE) IN NACL 2-0.9 UNITS/ML
INTRAMUSCULAR | Status: DC | PRN
  Administered 2018-05-13: 11:00:00 via INTRA_ARTERIAL

## 2018-05-13 MED ORDER — ACETAMINOPHEN 325 MG PO TABS: 650.0000 mg | ORAL_TABLET | ORAL | Status: DC | PRN

## 2018-05-13 MED ORDER — IOHEXOL 350 MG/ML SOLN
INTRAVENOUS | Status: DC | PRN
  Administered 2018-05-13: 150 mL via INTRAVENOUS

## 2018-05-13 MED ORDER — LABETALOL HCL 5 MG/ML IV SOLN: 10.0000 mg | INTRAVENOUS | Status: DC | PRN

## 2018-05-13 MED ORDER — LIDOCAINE HCL (PF) 1 % IJ SOLN
INTRAMUSCULAR | Status: DC | PRN
  Administered 2018-05-13: 2 mL

## 2018-05-13 MED ORDER — HYDRALAZINE HCL 20 MG/ML IJ SOLN: 5.0000 mg | INTRAMUSCULAR | Status: DC | PRN

## 2018-05-13 MED ORDER — LIDOCAINE HCL (PF) 1 % IJ SOLN
INTRAMUSCULAR | Status: AC
  Filled 2018-05-13: qty 30

## 2018-05-13 MED ORDER — HEPARIN (PORCINE) IN NACL 1000-0.9 UT/500ML-% IV SOLN
INTRAVENOUS | Status: AC
  Filled 2018-05-13: qty 1000

## 2018-05-13 MED ORDER — NITROGLYCERIN 1 MG/10 ML FOR IR/CATH LAB
INTRA_ARTERIAL | Status: AC
  Filled 2018-05-13: qty 10

## 2018-05-13 MED ORDER — MORPHINE SULFATE (PF) 10 MG/ML IV SOLN
INTRAVENOUS | Status: AC
  Filled 2018-05-13: qty 1

## 2018-05-13 MED ORDER — SODIUM CHLORIDE 0.9 % WEIGHT BASED INFUSION
3.0000 mL/kg/h | INTRAVENOUS | Status: AC
  Administered 2018-05-13: 3 mL/kg/h via INTRAVENOUS

## 2018-05-13 MED ORDER — HEPARIN (PORCINE) IN NACL 2-0.9 UNITS/ML
INTRAMUSCULAR | Status: AC | PRN
  Administered 2018-05-13 (×2): 500 mL

## 2018-05-13 MED FILL — CLOPIDOGREL 75 MG TABLET: 75 | 30 days supply | Qty: 30 | Fill #0

## 2018-05-13 SURGICAL SUPPLY — 19 items
BALLN SAPPHIRE 2.0X10 (BALLOONS) ×2
BALLN SAPPHIRE 2.5X10 (BALLOONS) ×2
BALLOON SAPPHIRE 2.0X10 (BALLOONS) ×1 IMPLANT
BALLOON SAPPHIRE 2.5X10 (BALLOONS) ×1 IMPLANT
CATH OPTITORQUE TIG 4.0 5F (CATHETERS) ×2 IMPLANT
CATH VISTA GUIDE 6FR XBLAD3.5 (CATHETERS) ×2 IMPLANT
DEVICE RAD COMP TR BAND LRG (VASCULAR PRODUCTS) ×2 IMPLANT
GLIDESHEATH SLEND A-KIT 6F 22G (SHEATH) ×2 IMPLANT
GUIDEWIRE INQWIRE 1.5J.035X260 (WIRE) ×1 IMPLANT
INQWIRE 1.5J .035X260CM (WIRE) ×2
KIT ENCORE 26 ADVANTAGE (KITS) ×2 IMPLANT
KIT HEART LEFT (KITS) ×2 IMPLANT
PACK CARDIAC CATHETERIZATION (CUSTOM PROCEDURE TRAY) ×2 IMPLANT
STENT SYNERGY DES 2.25X12 (Permanent Stent) ×2 IMPLANT
STENT SYNERGY DES 2.75X12 (Permanent Stent) ×2 IMPLANT
TRANSDUCER W/STOPCOCK (MISCELLANEOUS) ×2 IMPLANT
TUBING CIL FLEX 10 FLL-RA (TUBING) ×2 IMPLANT
WIRE ASAHI PROWATER 180CM (WIRE) ×2 IMPLANT
WIRE HI TORQ BMW 190CM (WIRE) ×2 IMPLANT

## 2018-05-13 NOTE — H&P (Signed)
History and Physical Note:  NAME:  Mark Shah   MRN: [id number] DOB:  Oct 23, 1956   ADMIT DATE: 05/13/2018  PCP: Catha Gosselin, MD CARDIOLOGIST: Dr. Donnie Aho  05/13/2018 11:02 AM  Mark Shah is a 62 y.o. male with a known history of diabetes mellitus, type II, hypertension hyperlipidemia who is is usually active with routine exercise, running on treadmill etc.  He was recently seen by Dr. Donnie Aho for evaluation of intermittent episodes of chest discomfort occurring usually when starting his runs.  He felt tightness and heaviness in his chest especially if he would start running or walking up a hill etc.  Apparently a few years ago he had a Myoview stress test done that was unremarkable.  Dr. Donnie Aho sent him for a GXT on June 25.  He was able to walk 6:40 minutes and had 2 mm ST elevations in the anterior leads with clinical angina symptoms.  He did reach target heart rate of 156 bpm. As result cardiac catheterization was recommended.  He now presents here for cardiac catheterization.  He was started on Toprol 25 mg with sublingual nitroglycerin as needed.   Past Medical History:  Diagnosis Date  . Adhesive capsulitis    in shoulders  . Diabetes mellitus without complication (HCC)   . Diverticulitis   . Dyslipidemia   . ED (erectile dysfunction)   . HTN (hypertension)   . Hyperlipidemia   . Hypothyroidism   . PONV (postoperative nausea and vomiting)    Past Surgical History:  Procedure Laterality Date  . BACK SURGERY    . CERVICAL DISC SURGERY    . left hand      FAMHx: Family History  Problem Relation Age of Onset  . Hyperlipidemia Mother   . Asthma Mother   . Asthma Father   . COPD Father   . Cancer - Lung Maternal Grandfather   . Hyperlipidemia Paternal Grandfather   . Stroke Paternal Grandfather   . CVA Paternal Grandfather     SOCHx:  reports that he has quit smoking. He has never used smokeless tobacco. He reports that he drinks alcohol. He reports that  he does not use drugs.  ALLERGIES: Allergies  Allergen Reactions  . Erythromycin Other (See Comments)    Upset stomach    HOME MEDICATIONS: Medications Prior to Admission  Medication Sig Dispense Refill Last Dose  . aspirin 325 MG tablet Take 325 mg by mouth daily.   05/12/2018 at 0800  . Cholecalciferol (VITAMIN D3) 3000 units TABS Take 1 tablet by mouth daily.   Past Month at Unknown time  . insulin lispro (HUMALOG) 100 UNIT/ML injection Inject into the skin as directed. As directed with insulin pump. No unites given.   05/13/2018 at Unknown time  . metoprolol succinate (TOPROL-XL) 25 MG 24 hr tablet Take 25 mg by mouth daily.  1 05/13/2018 at 0730  . SYNTHROID 175 MCG tablet Take 175 mcg by mouth daily before breakfast.    05/13/2018 at 0730  . telmisartan (MICARDIS) 40 MG tablet Take 40 mg by mouth daily.   05/13/2018 at 0730  . ZETIA 10 MG tablet Take 1 tablet by mouth daily.   05/13/2018 at 0730  . zolpidem (AMBIEN) 10 MG tablet Take 5 mg by mouth at bedtime as needed for sleep.   0 05/12/2018 at Unknown time  . fluticasone (FLONASE) 50 MCG/ACT nasal spray USE 1 SPRAY IN EACH NOSTRIL DAILY PRN FOR ALLERGIES  1 More than a month at Unknown time  .  Glucagon, rDNA, (GLUCAGON EMERGENCY IJ) Inject 1 mg as directed as needed (as directed for diabetes.Marland Kitchen.).   More than a month at Unknown time  . insulin glargine (LANTUS) 100 UNIT/ML injection Inject 11 Units into the skin See admin instructions. ONLY IF INSULIN PUMP CAN NOT BE USED    More than a month at Unknown time  . loratadine (CLARITIN) 10 MG tablet Take 10 mg by mouth daily as needed for allergies.   More than a month at Unknown time  . nitroGLYCERIN (NITROSTAT) 0.4 MG SL tablet Place 1 tablet under the tongue every 5 (five) minutes as needed for chest pain.   0 Unknown at Unknown time  . sildenafil (VIAGRA) 100 MG tablet Take 50 mg by mouth daily as needed for erectile dysfunction.    Unknown at Unknown time    PHYSICAL EXAM:Blood pressure 117/75,  pulse 73, temperature 98.5 F (36.9 C), temperature source Oral, height 5' 10.5" (1.791 m), weight 175 lb (79.4 kg), SpO2 98 %. General appearance: alert, cooperative and appears stated age Neck: no adenopathy, no carotid bruit, no JVD, supple, symmetrical, trachea midline and thyroid not enlarged, symmetric, no tenderness/mass/nodules Lungs: clear to auscultation bilaterally Heart: regular rate and rhythm, S1, S2 normal, no murmur, click, rub or gallop Abdomen: soft, non-tender; bowel sounds normal; no masses,  no organomegaly Extremities: extremities normal, atraumatic, no cyanosis or edema Pulses: 2+ and symmetric Skin: Skin color, texture, turgor normal. No rashes or lesions Neurologic: Alert and oriented X 3, normal strength and tone. Normal symmetric reflexes. Normal coordination and gait Cranial nerves: normal   IMPRESSION & PLAN The patients' history has been reviewed, patient examined, no change in status from most recent note, stable for surgery. I have reviewed the patients' chart and labs. Questions were answered to the patient's satisfaction.    Mark Shah has presented today for surgery, with the diagnosis of chest pain with high risk for cardiac etiology (exertional angina, class II) with abnormal GXT  The various methods of treatment have been discussed with the patient and family.   Risks / Complications include, but not limited to: Death, MI, CVA/TIA, VF/VT (with defibrillation), Bradycardia (need for temporary pacer placement), contrast induced nephropathy , bleeding / bruising / hematoma / pseudoaneurysm, vascular or coronary injury (with possible emergent CT or Vascular Surgery), adverse medication reactions, infection.     After consideration of risks, benefits and other options for treatment, the patient has consented to Procedure(s):  LEFT HEART CATHETERIZATION AND CORONARY ANGIOGRAPHY +/- AD HOC PERCUTANEOUS CORONARY INTERVENTION  as a surgical intervention.     We will proceed with the planned procedure.   Cath Lab Visit (complete for each Cath Lab visit)  Clinical Evaluation Leading to the Procedure:   ACS: No.  Non-ACS:    Anginal Classification: CCS II  Anti-ischemic medical therapy: Minimal Therapy (1 class of medications)  Non-Invasive Test Results: High-risk stress test findings: cardiac mortality >3%/year  Prior CABG: No previous CABG    Bryan Lemmaavid Christyl Osentoski [facility] [address] [place], [place]  [id number]  [phone]  05/13/2018 11:02 AM

## 2018-05-13 NOTE — Discharge Instructions (Signed)
Drink plenty of fluids over next 48 hours and keep right wrist elevated at heart level for 24 hours ° °Radial Site Care °Refer to this sheet in the next few weeks. These instructions provide you with information about caring for yourself after your procedure. Your health care provider may also give you more specific instructions. Your treatment has been planned according to current medical practices, but problems sometimes occur. Call your health care provider if you have any problems or questions after your procedure. °What can I expect after the procedure? °After your procedure, it is typical to have the following: °· Bruising at the radial site that usually fades within 1-2 weeks. °· Blood collecting in the tissue (hematoma) that may be painful to the touch. It should usually decrease in size and tenderness within 1-2 weeks. ° °Follow these instructions at home: °· Take medicines only as directed by your health care provider. °· You may shower 24-48 hours after the procedure or as directed by your health care provider. Remove the bandage (dressing) and gently wash the site with plain soap and water. Pat the area dry with a clean towel. Do not rub the site, because this may cause bleeding. °· Do not take baths, swim, or use a hot tub until your health care provider approves. °· Check your insertion site every day for redness, swelling, or drainage. °· Do not apply powder or lotion to the site. °· Do not flex or bend the affected arm for 24 hours or as directed by your health care provider. °· Do not push or pull heavy objects with the affected arm for 24 hours or as directed by your health care provider. °· Do not lift over 10 lb (4.5 kg) for 5 days after your procedure or as directed by your health care provider. °· Ask your health care provider when it is okay to: °? Return to work or school. °? Resume usual physical activities or sports. °? Resume sexual activity. °· Do not drive home if you are discharged the  same day as the procedure. Have someone else drive you. °· You may drive 24 hours after the procedure unless otherwise instructed by your health care provider. °· Do not operate machinery or power tools for 24 hours after the procedure. °· If your procedure was done as an outpatient procedure, which means that you went home the same day as your procedure, a responsible adult should be with you for the first 24 hours after you arrive home. °· Keep all follow-up visits as directed by your health care provider. This is important. °Contact a health care provider if: °· You have a fever. °· You have chills. °· You have increased bleeding from the radial site. Hold pressure on the site. °Get help right away if: °· You have unusual pain at the radial site. °· You have redness, warmth, or swelling at the radial site. °· You have drainage (other than a small amount of blood on the dressing) from the radial site. °· The radial site is bleeding, and the bleeding does not stop after 30 minutes of holding steady pressure on the site. °· Your arm or hand becomes pale, cool, tingly, or numb. °This information is not intended to replace advice given to you by your health care provider. Make sure you discuss any questions you have with your health care provider. °Document Released: 11/24/2010 Document Revised: 03/29/2016 Document Reviewed: 05/10/2014 °Elsevier Interactive Patient Education © 2018 [employer] ° °

## 2018-05-13 NOTE — Progress Notes (Signed)
CARDIAC REHAB PHASE I   Educated pt and wife on importance of Plavix, ASA, statin and NTG. Pt verbalizes understanding. Pt given stent card, along with heart healthy and diabetic diets. Reviewed restrictions and exercise guidelines. Encouraged pt to keep wrist out of standing water until the incision fully heals. Will refer to CRP II [place].  [id number] Reynold Boweneresa  Alakai Macbride, RN BSN 05/13/2018 1:39 PM

## 2018-05-13 NOTE — Progress Notes (Signed)
**Note Mark Shah-Identified via Obfuscation** Patient discharged home alert and oriented x4. Patient denies any chest pain or dizziness at the moment upon assessment. Patient's radial site level 0 and dressing clean, dry,and intact. Patient accompanied by spouse and daughter upon discharge home.

## 2018-05-13 NOTE — Research (Signed)
CADFEM Informed Consent   Subject Name: Mark Shah  Subject met inclusion and exclusion criteria.  The informed consent form, study requirements and expectations were reviewed with the subject and questions and concerns were addressed prior to the signing of the consent form.  The subject verbalized understanding of the trail requirements.  The subject agreed to participate in the CADFEM trial and signed the informed consent.  The informed consent was obtained prior to performance of any protocol-specific procedures for the subject.  A copy of the signed informed consent was given to the subject and a copy was placed in the subject's medical record.  Neva Seat 05/13/2018, 10:18 AM

## 2018-05-13 NOTE — Discharge Summary (Signed)
Discharge Summary    Patient ID: Mark Shah,  MRN: [id number], DOB/AGE: May 20, 1956 62 y.o.  Admit date: 05/13/2018 Discharge date: 05/13/2018  Primary Care Provider: Catha Gosselin Primary Cardiologist: Dr. Donnie Aho   Discharge Diagnoses    Principal Problem:   Progressive angina [facility]) Active Problems:   Abnormal stress ECG with treadmill   Essential hypertension   Hyperlipidemia due to type 1 diabetes mellitus (HCC)  Allergies Allergies  Allergen Reactions  . Erythromycin Other (See Comments)    Upset stomach    Diagnostic Studies/Procedures    Cath: 05/13/18  Conclusion     CULPRIT LESION: Prox LAD lesion is 95% stenosed.  A drug-eluting stent was successfully placed using a STENT SYNERGY DES 2.75X12. Postdilated to 2.9 mm  Post intervention, there is a 0% residual stenosis.  _____________________________________________________  Lesion #2: Ost 1st Diag lesion is 75% stenosed.  A drug-eluting stent was successfully placed using a STENT SYNERGY DES 2.25X12. Postdilated 2.4 mm  Post intervention, there is a 0% residual stenosis.  ______________________________________________________  Suezanne Jacquet LAD lesion is 25% stenosed -lesion not involved with stent  Mid LAD-1 lesion is 50% stenosed. Mid LAD-2 lesion is 40% stenosed.  Prox Cx lesion is 30% stenosed.  The left ventricular systolic function is normal. LVEF 55-65% by visual estimate. Normal LVEDP (10 mmHg)    Severe two-vessel disease involving the proximal LAD and proximal 1st Diag branch --> post lesion successfully treated with DES stents.  Preserved LVEF with normal LVEDP  Plan: Return to short stay holding area for post PCI care.  Anticipate same-day discharge today. Recommend dual antiplatelet therapy with Aspirin 81mg  daily and Clopidogrel 75mg  daily long-term (beyond 12 months) because of Proximal LAD lesion (would be OK to stop ASA before 12 months if necessary).   He will follow-up with Dr.  Donnald Garre, M.D., M.S.  _____________   History of Present Illness     Mark Shah is a 62 y.o. male with a known history of diabetes mellitus, type II, hypertension hyperlipidemia who is is usually active with routine exercise, running on treadmill etc.  He was recently seen by Dr. Donnie Aho for evaluation of intermittent episodes of chest discomfort occurring usually when starting his runs.  He felt tightness and heaviness in his chest especially if he would start running or walking up a hill etc.  Apparently a few years ago he had a Myoview stress test done that was unremarkable.  Dr. Donnie Aho sent him for a GXT on June 25.  He was able to walk 6:40 minutes and had 2 mm ST elevations in the anterior leads with clinical angina symptoms.  He did reach target heart rate of 156 bpm. As result cardiac catheterization was recommended.  He presented to the cath lab on 05/13/18 for cardiac catheterization.  He was started on Toprol 25 mg with sublingual nitroglycerin as needed at his last appt.   Hospital Course      Underwent cardiac cath noted above with severe two-vessel disease involving the proximal LAD and proximal 1st Diag branch --> post lesion successfully treated with DES x2. Patient is already on BB, and has a Rx for nitro. Plan for DAPT with ASA/plavix. He seen by cardiac rehab while in short stay. Given instructions/precautions prior to discharge. Discussed the need for statin, and he reported he was on statin in the past and caused elevated LFTs. May be a candidate for PCSK9s as an outpatient but will defer to primary cardiologist. Right radial  cath site stable.   Mark Shah was seen by Dr. Herbie Baltimore and determined stable for discharge home. Follow up in the office has been arranged. Medications are listed below.   _____________  Discharge Vitals Blood pressure 115/63, pulse 63, temperature 98 F (36.7 C), temperature source Oral, resp. rate 12, height 5' 10.5" (1.791 m),  weight 175 lb (79.4 kg), SpO2 98 %.  Filed Weights   05/13/18 0829  Weight: 175 lb (79.4 kg)    Labs & Radiologic Studies    CBC Recent Labs    05/13/18 0907  WBC 3.9*  HGB 12.7*  HCT 36.2*  MCV 95.0  PLT 224   Basic Metabolic Panel Recent Labs    16/10/96 0907  NA 135  K 4.1  CL 103  CO2 23  GLUCOSE 246*  BUN 6*  CREATININE 1.03  CALCIUM 8.6*   Liver Function Tests No results for input(s): AST, ALT, ALKPHOS, BILITOT, PROT, ALBUMIN in the last 72 hours. No results for input(s): LIPASE, AMYLASE in the last 72 hours. Cardiac Enzymes No results for input(s): CKTOTAL, CKMB, CKMBINDEX, TROPONINI in the last 72 hours. BNP Invalid input(s): POCBNP D-Dimer No results for input(s): DDIMER in the last 72 hours. Hemoglobin A1C No results for input(s): HGBA1C in the last 72 hours. Fasting Lipid Panel No results for input(s): CHOL, HDL, LDLCALC, TRIG, CHOLHDL, LDLDIRECT in the last 72 hours. Thyroid Function Tests No results for input(s): TSH, T4TOTAL, T3FREE, THYROIDAB in the last 72 hours.  Invalid input(s): FREET3 _____________  Dg Chest 2 View  Result Date: 04/30/2018 CLINICAL DATA:  Chest pain and shortness of breath on exertion for the past year. History of diabetes and hypertension EXAM: CHEST - 2 VIEW COMPARISON:  PA and lateral chest x-ray of Mar 17, 2013 FINDINGS: The lungs are well-expanded and clear. The heart and pulmonary vascularity are normal. The mediastinum is normal in width. There is no pleural effusion. The bony thorax is unremarkable. IMPRESSION: There is no active cardiopulmonary disease. Electronically Signed   By: David  [place] M.D.   On: 04/30/2018 09:35   Disposition   Pt is being discharged home today in good condition.  Follow-up Plans & Appointments    Follow-up Information    Othella Boyer, MD Follow up on 05/23/2018.   Specialty:  Cardiology Why:  at 10:30am for your follow up appt.  Contact information: [address] [place] [address] [phone]          Discharge Instructions    AMB Referral to Cardiac Rehabilitation - Phase II   Complete by:  As directed    Diagnosis:  Coronary Stents   Amb Referral to Cardiac Rehabilitation   Complete by:  As directed    Diagnosis:  Coronary Stents   Call MD for:  redness, tenderness, or signs of infection (pain, swelling, redness, odor or green/yellow discharge around incision site)   Complete by:  As directed    Diet - low sodium heart healthy   Complete by:  As directed    Discharge instructions   Complete by:  As directed    Radial Site Care Refer to this sheet in the next few weeks. These instructions provide you with information on caring for yourself after your procedure. Your caregiver may also give you more specific instructions. Your treatment has been planned according to current medical practices, but problems sometimes occur. Call your caregiver if you have any problems or questions after your procedure. HOME CARE INSTRUCTIONS  You may shower the day after the procedure.Remove the bandage (dressing) and gently wash the site with plain soap and water.Gently pat the site dry.  Do not apply powder or lotion to the site.  Do not submerge the affected site in water for 3 to 5 days.  Inspect the site at least twice daily.  Do not flex or bend the affected arm for 24 hours.  No lifting over 5 pounds (2.3 kg) for 5 days after your procedure.  Do not drive home if you are discharged the same day of the procedure. Have someone else drive you.  You may drive 24 hours after the procedure unless otherwise instructed by your caregiver.  What to expect: Any bruising will usually fade within 1 to 2 weeks.  Blood that collects in the tissue (hematoma) may be painful to the touch. It should usually decrease in size and tenderness within 1 to 2 weeks.  SEEK IMMEDIATE MEDICAL CARE IF: You have unusual pain at the radial site.  You have  redness, warmth, swelling, or pain at the radial site.  You have drainage (other than a small amount of blood on the dressing).  You have chills.  You have a fever or persistent symptoms for more than 72 hours.  You have a fever and your symptoms suddenly get worse.  Your arm becomes pale, cool, tingly, or numb.  You have heavy bleeding from the site. Hold pressure on the site.   PLEASE DO NOT MISS ANY DOSES OF YOUR PLAVIX!!!!! Also keep a log of you blood pressures and bring back to your follow up appt. Please call the office with any questions.   Patients taking blood thinners should generally stay away from medicines like ibuprofen, Advil, Motrin, naproxen, and Aleve due to risk of stomach bleeding. You may take Tylenol as directed or talk to your primary doctor about alternatives.   Increase activity slowly   Complete by:  As directed      Discharge Medications     Medication List    STOP taking these medications   aspirin 325 MG tablet Replaced by:  aspirin EC 81 MG tablet     TAKE these medications   aspirin EC 81 MG tablet Take 1 tablet (81 mg total) by mouth daily. Replaces:  aspirin 325 MG tablet   clopidogrel 75 MG tablet Commonly known as:  PLAVIX Take 1 tablet (75 mg total) by mouth daily.   fluticasone 50 MCG/ACT nasal spray Commonly known as:  FLONASE USE 1 SPRAY IN EACH NOSTRIL DAILY PRN FOR ALLERGIES   GLUCAGON EMERGENCY IJ Inject 1 mg as directed as needed (as directed for diabetes.Marland Kitchen).   insulin glargine 100 UNIT/ML injection Commonly known as:  LANTUS Inject 11 Units into the skin See admin instructions. ONLY IF INSULIN PUMP CAN NOT BE USED   insulin lispro 100 UNIT/ML injection Commonly known as:  HUMALOG Inject into the skin as directed. As directed with insulin pump. No unites given.   loratadine 10 MG tablet Commonly known as:  CLARITIN Take 10 mg by mouth daily as needed for allergies.   metoprolol succinate 25 MG 24 hr tablet Commonly  known as:  TOPROL-XL Take 25 mg by mouth daily.   nitroGLYCERIN 0.4 MG SL tablet Commonly known as:  NITROSTAT Place 1 tablet under the tongue every 5 (five) minutes as needed for chest pain.   sildenafil 100 MG tablet Commonly known as:  VIAGRA Take 50 mg by mouth daily as needed for  erectile dysfunction.   SYNTHROID 175 MCG tablet Generic drug:  levothyroxine Take 175 mcg by mouth daily before breakfast.   telmisartan 40 MG tablet Commonly known as:  MICARDIS Take 40 mg by mouth daily.   Vitamin D3 3000 units Tabs Take 1 tablet by mouth daily.   ZETIA 10 MG tablet Generic drug:  ezetimibe Take 1 tablet by mouth daily.   zolpidem 10 MG tablet Commonly known as:  AMBIEN Take 5 mg by mouth at bedtime as needed for sleep.       Acute coronary syndrome (MI, NSTEMI, STEMI, etc) this admission?: No.     Outstanding Labs/Studies   N/a   Duration of Discharge Encounter   Greater than 30 minutes including physician time.  Signed, Laverda PageLindsay Roberts NP-C 05/13/2018, 2:31 PM

## 2018-05-14 ENCOUNTER — Encounter (HOSPITAL_COMMUNITY): Payer: Self-pay | Admitting: Cardiology

## 2018-05-14 ENCOUNTER — Encounter: Payer: Self-pay | Admitting: Cardiology

## 2018-05-14 DIAGNOSIS — E109 Type 1 diabetes mellitus without complications: Secondary | ICD-10-CM | POA: Diagnosis not present

## 2018-05-14 DIAGNOSIS — Z0000 Encounter for general adult medical examination without abnormal findings: Secondary | ICD-10-CM | POA: Diagnosis not present

## 2018-05-14 DIAGNOSIS — I2510 Atherosclerotic heart disease of native coronary artery without angina pectoris: Secondary | ICD-10-CM

## 2018-05-14 DIAGNOSIS — I10 Essential (primary) hypertension: Secondary | ICD-10-CM | POA: Diagnosis not present

## 2018-05-14 DIAGNOSIS — E039 Hypothyroidism, unspecified: Secondary | ICD-10-CM | POA: Diagnosis not present

## 2018-05-14 DIAGNOSIS — Z23 Encounter for immunization: Secondary | ICD-10-CM | POA: Diagnosis not present

## 2018-05-14 HISTORY — DX: Atherosclerotic heart disease of native coronary artery without angina pectoris: I25.10

## 2018-05-14 MED FILL — Heparin Sod (Porcine)-NaCl IV Soln 1000 Unit/500ML-0.9%: INTRAVENOUS | Qty: 500 | Status: AC

## 2018-05-16 ENCOUNTER — Telehealth (HOSPITAL_COMMUNITY): Payer: Self-pay

## 2018-05-16 DIAGNOSIS — I2510 Atherosclerotic heart disease of native coronary artery without angina pectoris: Secondary | ICD-10-CM | POA: Diagnosis not present

## 2018-05-16 NOTE — Telephone Encounter (Signed)
Attempted to contact patient to see if he is interested in the CR Program - lm on vm

## 2018-05-16 NOTE — Telephone Encounter (Signed)
Patients insurance is active and benefits verified through [place] - No co-pay, deductible amount of $400.00/$400.00 has been met, out of pocket amount of $2,500/$1,847.81 has been met, 15% co-insurance, and no pre-authorization is required. Passport/reference [phone]  Will contact patient to see if he is interested in the Cardiac Rehab program. If interested, patient will be contacted for scheduling upon review by the RN Navigator.

## 2018-05-21 ENCOUNTER — Telehealth (HOSPITAL_COMMUNITY): Payer: Self-pay

## 2018-05-21 NOTE — Telephone Encounter (Signed)
Attempted to contact patient in regards to Cardiac Rehab - lm on vm °

## 2018-05-21 NOTE — Telephone Encounter (Signed)
Patient called back and would like to participate in the Cardiac Rehab Program. Patient will come in for orientation on 06/26/18 @ 7:30AM and will attend the 6:45AM exercise class.  Mailed homework package.

## 2018-06-04 DIAGNOSIS — E119 Type 2 diabetes mellitus without complications: Secondary | ICD-10-CM | POA: Diagnosis not present

## 2018-06-04 DIAGNOSIS — H2513 Age-related nuclear cataract, bilateral: Secondary | ICD-10-CM | POA: Diagnosis not present

## 2018-06-06 DIAGNOSIS — E039 Hypothyroidism, unspecified: Secondary | ICD-10-CM | POA: Diagnosis not present

## 2018-06-06 DIAGNOSIS — E1065 Type 1 diabetes mellitus with hyperglycemia: Secondary | ICD-10-CM | POA: Diagnosis not present

## 2018-06-06 DIAGNOSIS — Z794 Long term (current) use of insulin: Secondary | ICD-10-CM | POA: Diagnosis not present

## 2018-06-17 ENCOUNTER — Telehealth (HOSPITAL_COMMUNITY): Payer: Self-pay

## 2018-06-24 NOTE — Telephone Encounter (Signed)
Attempted to contact [name] to review allergies and medications. Unable to reach.   Mark Shah, Mark BassetPharm D PGY1 Pharmacy Resident  Phone [phone] 06/24/2018      6:04 PM

## 2018-06-26 ENCOUNTER — Encounter (HOSPITAL_COMMUNITY): Payer: Self-pay

## 2018-06-26 ENCOUNTER — Encounter (HOSPITAL_COMMUNITY)
Admission: RE | Admit: 2018-06-26 | Discharge: 2018-06-26 | Disposition: A | Discharge status: Home / Self Care | Payer: BLUE CROSS/BLUE SHIELD | Source: Ambulatory Visit | Attending: Cardiology | Admitting: Cardiology

## 2018-06-26 VITALS — BP 115/74 | HR 78 | Ht 70.75 in | Wt 175.9 lb

## 2018-06-26 DIAGNOSIS — I2510 Atherosclerotic heart disease of native coronary artery without angina pectoris: Secondary | ICD-10-CM | POA: Diagnosis not present

## 2018-06-26 DIAGNOSIS — Z955 Presence of coronary angioplasty implant and graft: Secondary | ICD-10-CM | POA: Insufficient documentation

## 2018-06-26 NOTE — Progress Notes (Addendum)
Cardiac Rehab Medication Review by RN  Does the patient  feel that his/her medications are working for him/her?  yes  Has the patient been experiencing any side effects to the medications prescribed?  no  Does the patient measure his/her own blood pressure or blood glucose at home?  yes Checks Blood pressure and blood sugar. Pt has insulin pump.   Does the patient have any problems obtaining medications due to transportation or finances?   no  Understanding of regimen: excellent Understanding of indications: excellent Potential of compliance: excellent    RN comments: N/A    Nikki Domverett, Frieda Arnall G 06/26/2018 8:01 AM

## 2018-06-26 NOTE — Progress Notes (Signed)
Mark AmenJoseph Shah 62 y.o. male DOB: February 07, 1956 MRN: [id number]      Nutrition Note  No diagnosis found. Past Medical History:  Diagnosis Date  . Adhesive capsulitis    in shoulders  . Diabetes mellitus without complication (HCC)   . Diverticulitis   . Dyslipidemia   . ED (erectile dysfunction)   . HTN (hypertension)   . Hyperlipidemia   . Hypothyroidism   . PONV (postoperative nausea and vomiting)    Meds reviewed. Glucagon, lantus, humalog, toprol, crestor, zetia noted  HT: Ht Readings from Last 1 Encounters:  05/13/18 5' 10.5" (1.791 m)    WT: Wt Readings from Last 5 Encounters:  05/13/18 175 lb (79.4 kg)  04/12/17 194 lb (88 kg)  10/05/14 190 lb (86.2 kg)  08/28/13 200 lb (90.7 kg)     There is no height or weight on file to calculate BMI.   Current tobacco use? no   Labs:  Lipid Panel  No results found for: CHOL, TRIG, HDL, CHOLHDL, VLDL, LDLCALC, LDLDIRECT  No results found for: HGBA1C CBG (last 3)  No results for input(s): GLUCAP in the last 72 hours.  Nutrition Note Spoke with pt. Nutrition plan and goals reviewed with pt. Pt is following Step 2 of the Therapeutic Lifestyle Changes diet. Pt wants to maintain wt. Wt loss tips reviewed (label reading, how to build a healthy plate, portion sizes, eating frequently across the day). Pt is diabetic. Pt reports last A1c 7.4, indicates blood glucose not well-controlled. This Clinical research associatewriter went over Diabetes Education test results. Pt checks CBG's 4-6 times a day. Fasting CBG's reportedly 110-140 mg/dL. Pt with dx of CHF.Per discussion, pt does not use canned /convenience foods often. Pt rarely adds salt to food. Pt eats out infrequently. Pt expressed understanding of the information reviewed. Pt aware of nutrition education classes offered and plans on attending nutrition classes.  Nutrition Diagnosis ? Food-and nutrition-related knowledge deficit related to lack of exposure to information as related to diagnosis of: ? CVD ? DM    Nutrition Intervention ? Pt's individual nutrition plan and goals reviewed with pt.   Nutrition Goal(s):   ? Pt to identify and limit food sources of saturated fat, trans fat, refined carbohydrates and sodium ? Improved blood glucose control as evidenced by pt's A1c trending from 7.4 toward less than 7.0. ? Pt able to name foods that affect blood glucose.   Plan:  ? Pt to attend nutrition classes ? Nutrition I ? Nutrition II ? Portion Distortion  ? Diabetes Blitz ? Diabetes Q & Ae determined ? Will provide client-centered nutrition education as part of interdisciplinary care ? Monitor and evaluate progress toward nutrition goal with team.   Mark MarcusAubrey Burklin, MS, RD, LDN 06/26/2018 8:34 AM

## 2018-06-26 NOTE — Progress Notes (Signed)
Cardiac Individual Treatment Plan  Patient Details  Name: Mark Shah MRN: [id number] Date of Birth: 1956/03/27 Referring Provider:     CARDIAC REHAB PHASE II ORIENTATION from 06/26/2018 in [facility] CARDIAC REHAB  Referring Provider  Viann Fish MD       Initial Encounter Date:    CARDIAC REHAB PHASE II ORIENTATION from 06/26/2018 in [facility] CARDIAC REHAB  Date  06/26/18      Visit Diagnosis: 05/13/18 Status post coronary artery stent placement DES x 2 LAD, diag  Patient's Home Medications on Admission:  Current Outpatient Medications:  .  Cholecalciferol (VITAMIN D3) 3000 units TABS, Take 1 tablet by mouth daily., Disp: , Rfl:  .  clopidogrel (PLAVIX) 75 MG tablet, Take 1 tablet (75 mg total) by mouth daily., Disp: 30 tablet, Rfl: 0 .  fluticasone (FLONASE) 50 MCG/ACT nasal spray, USE 1 SPRAY IN EACH NOSTRIL DAILY PRN FOR ALLERGIES, Disp: , Rfl: 1 .  Glucagon, rDNA, (GLUCAGON EMERGENCY IJ), Inject 1 mg as directed as needed (as directed for diabetes.Marland Kitchen)., Disp: , Rfl:  .  insulin glargine (LANTUS) 100 UNIT/ML injection, Inject 11 Units into the skin See admin instructions. ONLY IF INSULIN PUMP CAN NOT BE USED , Disp: , Rfl:  .  insulin lispro (HUMALOG) 100 UNIT/ML injection, Inject into the skin as directed. As directed with insulin pump. No unites given., Disp: , Rfl:  .  loratadine (CLARITIN) 10 MG tablet, Take 10 mg by mouth daily as needed for allergies., Disp: , Rfl:  .  metoprolol succinate (TOPROL-XL) 25 MG 24 hr tablet, Take 25 mg by mouth daily., Disp: , Rfl: 1 .  nitroGLYCERIN (NITROSTAT) 0.4 MG SL tablet, Place 1 tablet under the tongue every 5 (five) minutes as needed for chest pain. , Disp: , Rfl: 0 .  rosuvastatin (CRESTOR) 10 MG tablet, Take 10 mg by mouth daily., Disp: , Rfl:  .  sildenafil (VIAGRA) 100 MG tablet, Take 50 mg by mouth daily as needed for erectile dysfunction. , Disp: , Rfl:  .  SYNTHROID 175 MCG tablet,  Take 175 mcg by mouth daily before breakfast. , Disp: , Rfl:  .  telmisartan (MICARDIS) 40 MG tablet, Take 40 mg by mouth daily., Disp: , Rfl:  .  zolpidem (AMBIEN) 10 MG tablet, Take 5 mg by mouth at bedtime as needed for sleep. , Disp: , Rfl: 0 .  ZETIA 10 MG tablet, Take 1 tablet by mouth daily., Disp: , Rfl:   Past Medical History: Past Medical History:  Diagnosis Date  . Adhesive capsulitis    in shoulders  . Diabetes mellitus without complication (HCC)   . Diverticulitis   . Dyslipidemia   . ED (erectile dysfunction)   . HTN (hypertension)   . Hyperlipidemia   . Hypothyroidism   . PONV (postoperative nausea and vomiting)     Tobacco Use: Social History   Tobacco Use  Smoking Status Former Smoker  Smokeless Tobacco Never Used    Labs: Recent Review Flowsheet Data    There is no flowsheet data to display.      Capillary Blood Glucose: Lab Results  Component Value Date   GLUCAP 185 (H) 05/13/2018   GLUCAP 239 (H) 05/13/2018   GLUCAP 72 03/07/2016   GLUCAP 59 (L) 03/07/2016   GLUCAP 125 (H) 03/07/2016     Exercise Target Goals: Exercise Program Goal: Individual exercise prescription set using results from initial 6 min walk test and THRR while considering  patient's  activity barriers and safety.   Exercise Prescription Goal: Initial exercise prescription builds to 30-45 minutes a day of aerobic activity, 2-3 days per week.  Home exercise guidelines will be given to patient during program as part of exercise prescription that the participant will acknowledge.  Activity Barriers & Risk Stratification: Activity Barriers & Cardiac Risk Stratification - 06/26/18 0839      Activity Barriers & Cardiac Risk Stratification   Activity Barriers  Deconditioning;Back Problems    Cardiac Risk Stratification  High       6 Minute Walk: 6 Minute Walk    Row Name 06/26/18 1127         6 Minute Walk   Phase  Initial     Distance  1619 feet     Walk Time  6 minutes      # of Rest Breaks  0     MPH  3.1     METS  3.94     RPE  9     Perceived Dyspnea   0     VO2 Peak  13.79     Symptoms  No     Resting HR  79 bpm     Resting BP  102/62     Resting Oxygen Saturation   99 %     Exercise Oxygen Saturation  during 6 min walk  100 %     Max Ex. HR  89 bpm     Max Ex. BP  118/60     2 Minute Post BP  115/74        Oxygen Initial Assessment:   Oxygen Re-Evaluation:   Oxygen Discharge (Final Oxygen Re-Evaluation):   Initial Exercise Prescription: Initial Exercise Prescription - 06/26/18 1100      Date of Initial Exercise RX and Referring Provider   Date  06/26/18    Referring Provider  Viann Fish MD     Expected Discharge Date  09/18/18      Treadmill   MPH  3.2    Grade  1    Minutes  10    METs  3.89      NuStep   Level  2    SPM  80    Minutes  10    METs  3      Rower   Level  3    Watts  25    Minutes  10    METs  4      Prescription Details   Frequency (times per week)  3x    Duration  Progress to 30 minutes of continuous aerobic without signs/symptoms of physical distress      Intensity   THRR 40-80% of Max Heartrate  63-126    Ratings of Perceived Exertion  11-13    Perceived Dyspnea  0-4      Progression   Progression  Continue progressive overload as per policy without signs/symptoms or physical distress.      Resistance Training   Training Prescription  Yes    Weight  4lbs    Reps  10-15       Perform Capillary Blood Glucose checks as needed.  Exercise Prescription Changes:   Exercise Comments:   Exercise Goals and Review: Exercise Goals    Row Name 06/26/18 0839             Exercise Goals   Increase Physical Activity  Yes       Intervention  Provide advice, education, support  and counseling about physical activity/exercise needs.;Develop an individualized exercise prescription for aerobic and resistive training based on initial evaluation findings, risk stratification,  comorbidities and participant's personal goals.       Expected Outcomes  Short Term: Attend rehab on a regular basis to increase amount of physical activity.;Long Term: Exercising regularly at least 3-5 days a week.;Long Term: Add in home exercise to make exercise part of routine and to increase amount of physical activity.       Increase Strength and Stamina  Yes get back to jogging/running       Intervention  Provide advice, education, support and counseling about physical activity/exercise needs.;Develop an individualized exercise prescription for aerobic and resistive training based on initial evaluation findings, risk stratification, comorbidities and participant's personal goals.       Expected Outcomes  Short Term: Increase workloads from initial exercise prescription for resistance, speed, and METs.;Short Term: Perform resistance training exercises routinely during rehab and add in resistance training at home;Long Term: Improve cardiorespiratory fitness, muscular endurance and strength as measured by increased METs and functional capacity ( )       Able to understand and use rate of perceived exertion (RPE) scale  Yes       Intervention  Provide education and explanation on how to use RPE scale       Expected Outcomes  Short Term: Able to use RPE daily in rehab to express subjective intensity level;Long Term:  Able to use RPE to guide intensity level when exercising independently       Knowledge and understanding of Target Heart Rate Range (THRR)  Yes       Intervention  Provide education and explanation of THRR including how the numbers were predicted and where they are located for reference       Expected Outcomes  Short Term: Able to state/look up THRR;Long Term: Able to use THRR to govern intensity when exercising independently;Short Term: Able to use daily as guideline for intensity in rehab       Able to check pulse independently  Yes       Intervention  Provide education and  demonstration on how to check pulse in carotid and radial arteries.;Review the importance of being able to check your own pulse for safety during independent exercise       Expected Outcomes  Short Term: Able to explain why pulse checking is important during independent exercise;Long Term: Able to check pulse independently and accurately       Understanding of Exercise Prescription  Yes       Intervention  Provide education, explanation, and written materials on patient's individual exercise prescription       Expected Outcomes  Short Term: Able to explain program exercise prescription;Long Term: Able to explain home exercise prescription to exercise independently          Exercise Goals Re-Evaluation :   Discharge Exercise Prescription (Final Exercise Prescription Changes):   Nutrition:  Target Goals: Understanding of nutrition guidelines, daily intake of sodium 1500mg , cholesterol 200mg , calories 30% from fat and 7% or less from saturated fats, daily to have 5 or more servings of fruits and vegetables.  Biometrics: Pre Biometrics - 06/26/18 1146      Pre Biometrics   Height  5' 10.75" (1.797 m)    Weight  79.8 kg    Waist Circumference  36 inches    Hip Circumference  38.5 inches    Waist to Hip Ratio  0.94 %  BMI (Calculated)  24.71    Triceps Skinfold  26 mm    % Body Fat  26.4 %    Grip Strength  41 kg    Flexibility  9 in    Single Leg Stand  30 seconds        Nutrition Therapy Plan and Nutrition Goals: Nutrition Therapy & Goals - 06/26/18 0902      Nutrition Therapy   Diet  consistent carbohydrate heart healthy      Personal Nutrition Goals   Nutrition Goal  pt able to name foods  that affect blood glucose    Personal Goal #2  pt to identify and limit food sources of sodium, saturated fat, trans fats, refined carbohydrates    Personal Goal #3  imporved blood glucose control as evidenced by pt's A1c trending from 7.4 toward less than 7.0      Intervention Plan    Intervention  Prescribe, educate and counsel regarding individualized specific dietary modifications aiming towards targeted core components such as weight, hypertension, lipid management, diabetes, heart failure and other comorbidities.    Expected Outcomes  Short Term Goal: Understand basic principles of dietary content, such as calories, fat, sodium, cholesterol and nutrients.       Nutrition Assessments: Nutrition Assessments - 06/26/18 0904      MEDFICTS Scores   Pre Score  9       Nutrition Goals Re-Evaluation:   Nutrition Goals Re-Evaluation:   Nutrition Goals Discharge (Final Nutrition Goals Re-Evaluation):   Psychosocial: Target Goals: Acknowledge presence or absence of significant depression and/or stress, maximize coping skills, provide positive support system. Participant is able to verbalize types and ability to use techniques and skills needed for reducing stress and depression.  Initial Review & Psychosocial Screening: Initial Psych Review & Screening - 06/26/18 1227      Initial Review   Current issues with  None Identified      Family Dynamics   Good Support System?  Yes   Pt reports his wife and friends as sources of support for him.     Barriers   Psychosocial barriers to participate in program  There are no identifiable barriers or psychosocial needs.      Screening Interventions   Interventions  Encouraged to exercise       Quality of Life Scores: Quality of Life - 06/26/18 1121      Quality of Life   Select  Quality of Life      Quality of Life Scores   Health/Function Pre  20.9 %    Socioeconomic Pre  24.36 %    Psych/Spiritual Pre  20.25 %    Family Pre  26.4 %    GLOBAL Pre  22.35 %      Scores of 19 and below usually indicate a poorer quality of life in these areas.  A difference of  2-3 points is a clinically meaningful difference.  A difference of 2-3 points in the total score of the Quality of Life Index has been associated with  significant improvement in overall quality of life, self-image, physical symptoms, and general health in studies assessing change in quality of life.  PHQ-9: Recent Review Flowsheet Data    Depression screen [facility] 2/9 11/18/2015   Decreased Interest 0   Down, Depressed, Hopeless 0   PHQ - 2 Score 0     Interpretation of Total Score  Total Score Depression Severity:  1-4 = Minimal depression, 5-9 = Mild depression, 10-14 =  Moderate depression, 15-19 = Moderately severe depression, 20-27 = Severe depression   Psychosocial Evaluation and Intervention:   Psychosocial Re-Evaluation:   Psychosocial Discharge (Final Psychosocial Re-Evaluation):   Vocational Rehabilitation: Provide vocational rehab assistance to qualifying candidates.   Vocational Rehab Evaluation & Intervention:   Education: Education Goals: Education classes will be provided on a weekly basis, covering required topics. Participant will state understanding/return demonstration of topics presented.  Learning Barriers/Preferences: Learning Barriers/Preferences - 06/26/18 0840      Learning Barriers/Preferences   Learning Barriers  Sight    Learning Preferences  Written Material;Video;Pictoral;Computer/Internet       Education Topics: Count Your Pulse:  -Group instruction provided by verbal instruction, demonstration, patient participation and written materials to support subject.  Instructors address importance of being able to find your pulse and how to count your pulse when at home without a heart monitor.  Patients get hands on experience counting their pulse with staff help and individually.   Heart Attack, Angina, and Risk Factor Modification:  -Group instruction provided by verbal instruction, video, and written materials to support subject.  Instructors address signs and symptoms of angina and heart attacks.    Also discuss risk factors for heart disease and how to make changes to improve heart health risk  factors.   Functional Fitness:  -Group instruction provided by verbal instruction, demonstration, patient participation, and written materials to support subject.  Instructors address safety measures for doing things around the house.  Discuss how to get up and down off the floor, how to pick things up properly, how to safely get out of a chair without assistance, and balance training.   Meditation and Mindfulness:  -Group instruction provided by verbal instruction, patient participation, and written materials to support subject.  Instructor addresses importance of mindfulness and meditation practice to help reduce stress and improve awareness.  Instructor also leads participants through a meditation exercise.    Stretching for Flexibility and Mobility:  -Group instruction provided by verbal instruction, patient participation, and written materials to support subject.  Instructors lead participants through series of stretches that are designed to increase flexibility thus improving mobility.  These stretches are additional exercise for major muscle groups that are typically performed during regular warm up and cool down.   Hands Only CPR:  -Group verbal, video, and participation provides a basic overview of AHA guidelines for community CPR. Role-play of emergencies allow participants the opportunity to practice calling for help and chest compression technique with discussion of AED use.   Hypertension: -Group verbal and written instruction that provides a basic overview of hypertension including the most recent diagnostic guidelines, risk factor reduction with self-care instructions and medication management.    Nutrition I class: Heart Healthy Eating:  -Group instruction provided by PowerPoint slides, verbal discussion, and written materials to support subject matter. The instructor gives an explanation and review of the Therapeutic Lifestyle Changes diet recommendations, which includes a  discussion on lipid goals, dietary fat, sodium, fiber, plant stanol/sterol esters, sugar, and the components of a well-balanced, healthy diet.   Nutrition II class: Lifestyle Skills:  -Group instruction provided by PowerPoint slides, verbal discussion, and written materials to support subject matter. The instructor gives an explanation and review of label reading, grocery shopping for heart health, heart healthy recipe modifications, and ways to make healthier choices when eating out.   Diabetes Question & Answer:  -Group instruction provided by PowerPoint slides, verbal discussion, and written materials to support subject matter. The instructor gives an  explanation and review of diabetes co-morbidities, pre- and post-prandial blood glucose goals, pre-exercise blood glucose goals, signs, symptoms, and treatment of hypoglycemia and hyperglycemia, and foot care basics.   Diabetes Blitz:  -Group instruction provided by PowerPoint slides, verbal discussion, and written materials to support subject matter. The instructor gives an explanation and review of the physiology behind type 1 and type 2 diabetes, diabetes medications and rational behind using different medications, pre- and post-prandial blood glucose recommendations and Hemoglobin A1c goals, diabetes diet, and exercise including blood glucose guidelines for exercising safely.    Portion Distortion:  -Group instruction provided by PowerPoint slides, verbal discussion, written materials, and food models to support subject matter. The instructor gives an explanation of serving size versus portion size, changes in portions sizes over the last 20 years, and what consists of a serving from each food group.   Stress Management:  -Group instruction provided by verbal instruction, video, and written materials to support subject matter.  Instructors review role of stress in heart disease and how to cope with stress positively.     Exercising on Your  Own:  -Group instruction provided by verbal instruction, power point, and written materials to support subject.  Instructors discuss benefits of exercise, components of exercise, frequency and intensity of exercise, and end points for exercise.  Also discuss use of nitroglycerin and activating EMS.  Review options of places to exercise outside of rehab.  Review guidelines for sex with heart disease.   Cardiac Drugs I:  -Group instruction provided by verbal instruction and written materials to support subject.  Instructor reviews cardiac drug classes: antiplatelets, anticoagulants, beta blockers, and statins.  Instructor discusses reasons, side effects, and lifestyle considerations for each drug class.   Cardiac Drugs II:  -Group instruction provided by verbal instruction and written materials to support subject.  Instructor reviews cardiac drug classes: angiotensin converting enzyme inhibitors (ACE-I), angiotensin II receptor blockers (ARBs), nitrates, and calcium channel blockers.  Instructor discusses reasons, side effects, and lifestyle considerations for each drug class.   Anatomy and Physiology of the Circulatory System:  Group verbal and written instruction and models provide basic cardiac anatomy and physiology, with the coronary electrical and arterial systems. Review of: AMI, Angina, Valve disease, Heart Failure, Peripheral Artery Disease, Cardiac Arrhythmia, Pacemakers, and the ICD.   Other Education:  -Group or individual verbal, written, or video instructions that support the educational goals of the cardiac rehab program.   Holiday Eating Survival Tips:  -Group instruction provided by PowerPoint slides, verbal discussion, and written materials to support subject matter. The instructor gives patients tips, tricks, and techniques to help them not only survive but enjoy the holidays despite the onslaught of food that accompanies the holidays.   Knowledge Questionnaire  Score: Knowledge Questionnaire Score - 06/26/18 1121      Knowledge Questionnaire Score   Pre Score  21/24       Core Components/Risk Factors/Patient Goals at Admission: Personal Goals and Risk Factors at Admission - 06/26/18 1148      Core Components/Risk Factors/Patient Goals on Admission    Weight Management  Yes;Weight Loss    Intervention  Weight Management: Develop a combined nutrition and exercise program designed to reach desired caloric intake, while maintaining appropriate intake of nutrient and fiber, sodium and fats, and appropriate energy expenditure required for the weight goal.;Weight Management: Provide education and appropriate resources to help participant work on and attain dietary goals.;Weight Management/Obesity: Establish reasonable short term and long term weight goals.  Admit Weight  175 lb 14.8 oz (79.8 kg)    Goal Weight: Long Term  165 lb (74.8 kg)    Expected Outcomes  Short Term: Continue to assess and modify interventions until short term weight is achieved;Long Term: Adherence to nutrition and physical activity/exercise program aimed toward attainment of established weight goal;Weight Loss: Understanding of general recommendations for a balanced deficit meal plan, which promotes 1-2 lb weight loss per week and includes a negative energy balance of [phone] kcal/d;Understanding recommendations for meals to include 15-35% energy as protein, 25-35% energy from fat, 35-60% energy from carbohydrates, less than 200mg  of dietary cholesterol, 20-35 gm of total fiber daily;Understanding of distribution of calorie intake throughout the day with the consumption of 4-5 meals/snacks       Core Components/Risk Factors/Patient Goals Review:    Core Components/Risk Factors/Patient Goals at Discharge (Final Review):    ITP Comments: ITP Comments    Row Name 06/26/18 0837           ITP Comments  Dr. Armanda Magic, Medical Director          Comments: Patient attended  orientation from [phone] to 0935 to review rules and guidelines for program. Completed 6 minute walk test, Intitial ITP, and exercise prescription.  VSS. Telemetry-SR.  Asymptomatic.

## 2018-06-30 ENCOUNTER — Encounter (HOSPITAL_COMMUNITY)
Admission: RE | Admit: 2018-06-30 | Discharge: 2018-06-30 | Disposition: A | Discharge status: Home / Self Care | Payer: BLUE CROSS/BLUE SHIELD | Source: Ambulatory Visit | Attending: Cardiology | Admitting: Cardiology

## 2018-06-30 ENCOUNTER — Encounter (HOSPITAL_COMMUNITY): Payer: Self-pay

## 2018-06-30 ENCOUNTER — Encounter (HOSPITAL_COMMUNITY): Payer: BLUE CROSS/BLUE SHIELD

## 2018-06-30 DIAGNOSIS — Z955 Presence of coronary angioplasty implant and graft: Secondary | ICD-10-CM | POA: Diagnosis not present

## 2018-06-30 LAB — GLUCOSE, CAPILLARY: Glucose-Capillary: 156 mg/dL — ABNORMAL HIGH (ref 70–99)

## 2018-06-30 NOTE — Progress Notes (Signed)
Daily Session Note  Patient Details  Name: Mark Shah MRN: [id number] Date of Birth: [birth year] Referring Provider:     CARDIAC REHAB PHASE II ORIENTATION from 06/26/2018 in [place]  Referring Provider  Tollie Eth MD       Encounter Date: 06/30/2018  Check In: Session Check In - 06/30/18 0747      Check-In   Supervising physician immediately available to respond to emergencies  [facility] immediately available    Physician(s)   Dr. Algis Liming    Location  [facility]    Staff Present  Jiles Garter, RN, BSN;Amber Fair, MS, ACSM RCEP, Exercise Physiologist;Tyara Carol Ada, MS,ACSM CEP, Exercise Physiologist;Molly DiVincenzo, MS, ACSM RCEP, Exercise Physiologist    Medication changes reported      No    Fall or balance concerns reported     No    Tobacco Cessation  No Change    Warm-up and Cool-down  Performed as group-led instruction    Resistance Training Performed  Yes    VAD Patient?  No    PAD/SET Patient?  No      Pain Assessment   Currently in Pain?  No/denies       Capillary Blood Glucose: Results for orders placed or performed during the hospital encounter of 06/30/18 (from the past 24 hour(s))  Glucose, capillary     Status: Abnormal   Collection Time: 06/30/18  6:58 AM  Result Value Ref Range   Glucose-Capillary 156 (H) 70 - 99 mg/dL    Exercise Prescription Changes - 06/30/18 1300      Response to Exercise   Blood Pressure (Admit)  112/70    Blood Pressure (Exercise)  126/70    Blood Pressure (Exit)  128/4    Heart Rate (Admit)  75 bpm    Heart Rate (Exercise)  101 bpm    Heart Rate (Exit)  75 bpm    Rating of Perceived Exertion (Exercise)  13    Perceived Dyspnea (Exercise)  0    Symptoms  None     Comments  Pt oriented to exercise equipment     Duration  Progress to 30 minutes of  aerobic without signs/symptoms of physical distress    Intensity  THRR New      Progression   Progression   Continue to progress workloads to maintain intensity without signs/symptoms of physical distress.    Average METs  3.59      Resistance Training   Training Prescription  Yes    Weight  4lbs    Reps  10-15    Time  10 Minutes      Interval Training   Interval Training  No      Treadmill   MPH  3.2    Grade  1    Minutes  10    METs  3.89      NuStep   Level  2    SPM  80    Minutes  10    METs  2.3      Rower   Level  3    Watts  26    Minutes  10    METs  4.6       Social History   Tobacco Use  Smoking Status Former Smoker  Smokeless Tobacco Never Used    Goals Met:  Exercise tolerated well  Goals Unmet:  Not Applicable  Comments: Pt started cardiac rehab today.  Pt tolerated light  exercise without difficulty. VSS, telemetry-SR, asymptomatic.  Medication list reconciled. Pt denies barriers to medicaiton compliance.  PSYCHOSOCIAL ASSESSMENT:  PHQ-0. Pt exhibits positive coping skills, hopeful outlook with supportive family. No psychosocial needs identified at this time, no psychosocial interventions necessary.  Pt oriented to exercise equipment and routine.    Understanding verbalized.    Dr. Fransico Him is Medical Director for Cardiac Rehab at [facility].

## 2018-07-02 ENCOUNTER — Encounter (HOSPITAL_COMMUNITY): Payer: BLUE CROSS/BLUE SHIELD

## 2018-07-02 ENCOUNTER — Encounter (HOSPITAL_COMMUNITY)
Admission: RE | Admit: 2018-07-02 | Discharge: 2018-07-02 | Disposition: A | Discharge status: Home / Self Care | Payer: BLUE CROSS/BLUE SHIELD | Source: Ambulatory Visit | Attending: Cardiology | Admitting: Cardiology

## 2018-07-02 DIAGNOSIS — Z955 Presence of coronary angioplasty implant and graft: Secondary | ICD-10-CM | POA: Diagnosis not present

## 2018-07-04 ENCOUNTER — Encounter (HOSPITAL_COMMUNITY): Payer: BLUE CROSS/BLUE SHIELD

## 2018-07-04 ENCOUNTER — Encounter (HOSPITAL_COMMUNITY)
Admission: RE | Admit: 2018-07-04 | Discharge: 2018-07-04 | Disposition: A | Discharge status: Home / Self Care | Payer: BLUE CROSS/BLUE SHIELD | Source: Ambulatory Visit | Attending: Cardiology | Admitting: Cardiology

## 2018-07-04 ENCOUNTER — Encounter (HOSPITAL_COMMUNITY): Payer: Self-pay

## 2018-07-04 DIAGNOSIS — Z955 Presence of coronary angioplasty implant and graft: Secondary | ICD-10-CM

## 2018-07-09 ENCOUNTER — Encounter (HOSPITAL_COMMUNITY): Payer: BLUE CROSS/BLUE SHIELD

## 2018-07-10 NOTE — Progress Notes (Signed)
Cardiac Individual Treatment Plan  Patient Details  Name: Mark Shah MRN: [id number] Date of Birth: [birth year] Referring Provider:     CARDIAC REHAB PHASE II ORIENTATION from 06/26/2018 in [facility] CARDIAC REHAB  Referring Provider  Viann Fish MD       Initial Encounter Date:    CARDIAC REHAB PHASE II ORIENTATION from 06/26/2018 in [facility] CARDIAC REHAB  Date  06/26/18      Visit Diagnosis: 05/13/18 Status post coronary artery stent placement DES x 2 LAD, diag  Patient's Home Medications on Admission:  Current Outpatient Medications:  .  Cholecalciferol (VITAMIN D3) 3000 units TABS, Take 1 tablet by mouth daily., Disp: , Rfl:  .  clopidogrel (PLAVIX) 75 MG tablet, Take 1 tablet (75 mg total) by mouth daily., Disp: 30 tablet, Rfl: 0 .  fluticasone (FLONASE) 50 MCG/ACT nasal spray, USE 1 SPRAY IN EACH NOSTRIL DAILY PRN FOR ALLERGIES, Disp: , Rfl: 1 .  Glucagon, rDNA, (GLUCAGON EMERGENCY IJ), Inject 1 mg as directed as needed (as directed for diabetes.Marland Kitchen)., Disp: , Rfl:  .  insulin glargine (LANTUS) 100 UNIT/ML injection, Inject 11 Units into the skin See admin instructions. ONLY IF INSULIN PUMP CAN NOT BE USED , Disp: , Rfl:  .  insulin lispro (HUMALOG) 100 UNIT/ML injection, Inject into the skin as directed. As directed with insulin pump. No unites given., Disp: , Rfl:  .  loratadine (CLARITIN) 10 MG tablet, Take 10 mg by mouth daily as needed for allergies., Disp: , Rfl:  .  metoprolol succinate (TOPROL-XL) 25 MG 24 hr tablet, Take 25 mg by mouth daily., Disp: , Rfl: 1 .  nitroGLYCERIN (NITROSTAT) 0.4 MG SL tablet, Place 1 tablet under the tongue every 5 (five) minutes as needed for chest pain. , Disp: , Rfl: 0 .  rosuvastatin (CRESTOR) 10 MG tablet, Take 10 mg by mouth daily., Disp: , Rfl:  .  sildenafil (VIAGRA) 100 MG tablet, Take 50 mg by mouth daily as needed for erectile dysfunction. , Disp: , Rfl:  .  SYNTHROID 175 MCG tablet,  Take 175 mcg by mouth daily before breakfast. , Disp: , Rfl:  .  telmisartan (MICARDIS) 40 MG tablet, Take 40 mg by mouth daily., Disp: , Rfl:  .  ZETIA 10 MG tablet, Take 1 tablet by mouth daily., Disp: , Rfl:  .  zolpidem (AMBIEN) 10 MG tablet, Take 5 mg by mouth at bedtime as needed for sleep. , Disp: , Rfl: 0  Past Medical History: Past Medical History:  Diagnosis Date  . Adhesive capsulitis    in shoulders  . Diabetes mellitus without complication (HCC)   . Diverticulitis   . Dyslipidemia   . ED (erectile dysfunction)   . HTN (hypertension)   . Hyperlipidemia   . Hypothyroidism   . PONV (postoperative nausea and vomiting)     Tobacco Use: Social History   Tobacco Use  Smoking Status Former Smoker  Smokeless Tobacco Never Used    Labs: Recent Review Flowsheet Data    There is no flowsheet data to display.      Capillary Blood Glucose: Lab Results  Component Value Date   GLUCAP 156 (H) 06/30/2018   GLUCAP 185 (H) 05/13/2018   GLUCAP 239 (H) 05/13/2018   GLUCAP 72 03/07/2016   GLUCAP 59 (L) 03/07/2016     Exercise Target Goals: Exercise Program Goal: Individual exercise prescription set using results from initial 6 min walk test and THRR while considering  patient's  activity barriers and safety.   Exercise Prescription Goal: Initial exercise prescription builds to 30-45 minutes a day of aerobic activity, 2-3 days per week.  Home exercise guidelines will be given to patient during program as part of exercise prescription that the participant will acknowledge.  Activity Barriers & Risk Stratification: Activity Barriers & Cardiac Risk Stratification - 06/26/18 0839      Activity Barriers & Cardiac Risk Stratification   Activity Barriers  Deconditioning;Back Problems    Cardiac Risk Stratification  High       6 Minute Walk: 6 Minute Walk    Row Name 06/26/18 1127         6 Minute Walk   Phase  Initial     Distance  1619 feet     Walk Time  6 minutes      # of Rest Breaks  0     MPH  3.1     METS  3.94     RPE  9     Perceived Dyspnea   0     VO2 Peak  13.79     Symptoms  No     Resting HR  79 bpm     Resting BP  102/62     Resting Oxygen Saturation   99 %     Exercise Oxygen Saturation  during 6 min walk  100 %     Max Ex. HR  89 bpm     Max Ex. BP  118/60     2 Minute Post BP  115/74        Oxygen Initial Assessment:   Oxygen Re-Evaluation:   Oxygen Discharge (Final Oxygen Re-Evaluation):   Initial Exercise Prescription: Initial Exercise Prescription - 06/26/18 1100      Date of Initial Exercise RX and Referring Provider   Date  06/26/18    Referring Provider  Viann Fish MD     Expected Discharge Date  09/18/18      Treadmill   MPH  3.2    Grade  1    Minutes  10    METs  3.89      NuStep   Level  2    SPM  80    Minutes  10    METs  3      Rower   Level  3    Watts  25    Minutes  10    METs  4      Prescription Details   Frequency (times per week)  3x    Duration  Progress to 30 minutes of continuous aerobic without signs/symptoms of physical distress      Intensity   THRR 40-80% of Max Heartrate  63-126    Ratings of Perceived Exertion  11-13    Perceived Dyspnea  0-4      Progression   Progression  Continue progressive overload as per policy without signs/symptoms or physical distress.      Resistance Training   Training Prescription  Yes    Weight  4lbs    Reps  10-15       Perform Capillary Blood Glucose checks as needed.  Exercise Prescription Changes: Exercise Prescription Changes    Row Name 06/30/18 1300             Response to Exercise   Blood Pressure (Admit)  112/70       Blood Pressure (Exercise)  126/70       Blood Pressure (Exit)  128/4       Heart Rate (Admit)  75 bpm       Heart Rate (Exercise)  101 bpm       Heart Rate (Exit)  75 bpm       Rating of Perceived Exertion (Exercise)  13       Perceived Dyspnea (Exercise)  0       Symptoms  None         Comments  Pt oriented to exercise equipment        Duration  Progress to 30 minutes of  aerobic without signs/symptoms of physical distress       Intensity  THRR New         Progression   Progression  Continue to progress workloads to maintain intensity without signs/symptoms of physical distress.       Average METs  3.59         Resistance Training   Training Prescription  Yes       Weight  4lbs       Reps  10-15       Time  10 Minutes         Interval Training   Interval Training  No         Treadmill   MPH  3.2       Grade  1       Minutes  10       METs  3.89         NuStep   Level  2       SPM  80       Minutes  10       METs  2.3         Rower   Level  3       Watts  26       Minutes  10       METs  4.6          Exercise Comments: Exercise Comments    Row Name 06/30/18 1344           Exercise Comments  Pt's first day of exercise. Pt oriented to exercise equipment. Pt responded well to exercise prescription. Will continue to monitor and progress pt as tolerated.           Exercise Goals and Review: Exercise Goals    Row Name 06/26/18 0839             Exercise Goals   Increase Physical Activity  Yes       Intervention  Provide advice, education, support and counseling about physical activity/exercise needs.;Develop an individualized exercise prescription for aerobic and resistive training based on initial evaluation findings, risk stratification, comorbidities and participant's personal goals.       Expected Outcomes  Short Term: Attend rehab on a regular basis to increase amount of physical activity.;Long Term: Exercising regularly at least 3-5 days a week.;Long Term: Add in home exercise to make exercise part of routine and to increase amount of physical activity.       Increase Strength and Stamina  Yes get back to jogging/running       Intervention  Provide advice, education, support and counseling about physical activity/exercise needs.;Develop an  individualized exercise prescription for aerobic and resistive training based on initial evaluation findings, risk stratification, comorbidities and participant's personal goals.       Expected Outcomes  Short Term: Increase workloads from initial exercise prescription for resistance,  speed, and METs.;Short Term: Perform resistance training exercises routinely during rehab and add in resistance training at home;Long Term: Improve cardiorespiratory fitness, muscular endurance and strength as measured by increased METs and functional capacity ( )       Able to understand and use rate of perceived exertion (RPE) scale  Yes       Intervention  Provide education and explanation on how to use RPE scale       Expected Outcomes  Short Term: Able to use RPE daily in rehab to express subjective intensity level;Long Term:  Able to use RPE to guide intensity level when exercising independently       Knowledge and understanding of Target Heart Rate Range (THRR)  Yes       Intervention  Provide education and explanation of THRR including how the numbers were predicted and where they are located for reference       Expected Outcomes  Short Term: Able to state/look up THRR;Long Term: Able to use THRR to govern intensity when exercising independently;Short Term: Able to use daily as guideline for intensity in rehab       Able to check pulse independently  Yes       Intervention  Provide education and demonstration on how to check pulse in carotid and radial arteries.;Review the importance of being able to check your own pulse for safety during independent exercise       Expected Outcomes  Short Term: Able to explain why pulse checking is important during independent exercise;Long Term: Able to check pulse independently and accurately       Understanding of Exercise Prescription  Yes       Intervention  Provide education, explanation, and written materials on patient's individual exercise prescription       Expected  Outcomes  Short Term: Able to explain program exercise prescription;Long Term: Able to explain home exercise prescription to exercise independently          Exercise Goals Re-Evaluation :   Discharge Exercise Prescription (Final Exercise Prescription Changes): Exercise Prescription Changes - 06/30/18 1300      Response to Exercise   Blood Pressure (Admit)  112/70    Blood Pressure (Exercise)  126/70    Blood Pressure (Exit)  128/4    Heart Rate (Admit)  75 bpm    Heart Rate (Exercise)  101 bpm    Heart Rate (Exit)  75 bpm    Rating of Perceived Exertion (Exercise)  13    Perceived Dyspnea (Exercise)  0    Symptoms  None     Comments  Pt oriented to exercise equipment     Duration  Progress to 30 minutes of  aerobic without signs/symptoms of physical distress    Intensity  THRR New      Progression   Progression  Continue to progress workloads to maintain intensity without signs/symptoms of physical distress.    Average METs  3.59      Resistance Training   Training Prescription  Yes    Weight  4lbs    Reps  10-15    Time  10 Minutes      Interval Training   Interval Training  No      Treadmill   MPH  3.2    Grade  1    Minutes  10    METs  3.89      NuStep   Level  2    SPM  80    Minutes  10    METs  2.3      Rower   Level  3    Watts  26    Minutes  10    METs  4.6       Nutrition:  Target Goals: Understanding of nutrition guidelines, daily intake of sodium 1500mg , cholesterol 200mg , calories 30% from fat and 7% or less from saturated fats, daily to have 5 or more servings of fruits and vegetables.  Biometrics: Pre Biometrics - 06/26/18 1146      Pre Biometrics   Height  5' 10.75" (1.797 m)    Weight  79.8 kg    Waist Circumference  36 inches    Hip Circumference  38.5 inches    Waist to Hip Ratio  0.94 %    BMI (Calculated)  24.71    Triceps Skinfold  26 mm    % Body Fat  26.4 %    Grip Strength  41 kg    Flexibility  9 in    Single Leg  Stand  30 seconds        Nutrition Therapy Plan and Nutrition Goals: Nutrition Therapy & Goals - 06/26/18 0902      Nutrition Therapy   Diet  consistent carbohydrate heart healthy      Personal Nutrition Goals   Nutrition Goal  pt able to name foods  that affect blood glucose    Personal Goal #2  pt to identify and limit food sources of sodium, saturated fat, trans fats, refined carbohydrates    Personal Goal #3  imporved blood glucose control as evidenced by pt's A1c trending from 7.4 toward less than 7.0      Intervention Plan   Intervention  Prescribe, educate and counsel regarding individualized specific dietary modifications aiming towards targeted core components such as weight, hypertension, lipid management, diabetes, heart failure and other comorbidities.    Expected Outcomes  Short Term Goal: Understand basic principles of dietary content, such as calories, fat, sodium, cholesterol and nutrients.       Nutrition Assessments: Nutrition Assessments - 06/26/18 0904      MEDFICTS Scores   Pre Score  9       Nutrition Goals Re-Evaluation:   Nutrition Goals Re-Evaluation:   Nutrition Goals Discharge (Final Nutrition Goals Re-Evaluation):   Psychosocial: Target Goals: Acknowledge presence or absence of significant depression and/or stress, maximize coping skills, provide positive support system. Participant is able to verbalize types and ability to use techniques and skills needed for reducing stress and depression.  Initial Review & Psychosocial Screening: Initial Psych Review & Screening - 06/26/18 1227      Initial Review   Current issues with  None Identified      Family Dynamics   Good Support System?  Yes   Pt reports his wife and friends as sources of support for him.     Barriers   Psychosocial barriers to participate in program  There are no identifiable barriers or psychosocial needs.      Screening Interventions   Interventions  Encouraged to  exercise       Quality of Life Scores: Quality of Life - 06/26/18 1121      Quality of Life   Select  Quality of Life      Quality of Life Scores   Health/Function Pre  20.9 %    Socioeconomic Pre  24.36 %    Psych/Spiritual Pre  20.25 %    Family Pre  26.4 %  GLOBAL Pre  22.35 %      Scores of 19 and below usually indicate a poorer quality of life in these areas.  A difference of  2-3 points is a clinically meaningful difference.  A difference of 2-3 points in the total score of the Quality of Life Index has been associated with significant improvement in overall quality of life, self-image, physical symptoms, and general health in studies assessing change in quality of life.  PHQ-9: Recent Review Flowsheet Data    Depression screen [facility] 2/9 06/30/2018 11/18/2015   Decreased Interest 0 0   Down, Depressed, Hopeless 0 0   PHQ - 2 Score 0 0     Interpretation of Total Score  Total Score Depression Severity:  1-4 = Minimal depression, 5-9 = Mild depression, 10-14 = Moderate depression, 15-19 = Moderately severe depression, 20-27 = Severe depression   Psychosocial Evaluation and Intervention: Psychosocial Evaluation - 06/30/18 1440      Psychosocial Evaluation & Interventions   Interventions  Encouraged to exercise with the program and follow exercise prescription    Comments  No psychosocial needs identified. "Dellie Burns" enjoys traveling, going to [employer], fishing, and reading.    Expected Outcomes  "[name]" will continue to exhibit a positive outlook with good coping skills.    Continue Psychosocial Services   No Follow up required       Psychosocial Re-Evaluation: Psychosocial Re-Evaluation    Row Name 07/04/18 1622             Psychosocial Re-Evaluation   Current issues with  None Identified       Comments  No interventions necessary.       Expected Outcomes  [name] will maintian a positive outlook.        Interventions  Encouraged to attend Cardiac Rehabilitation for the  exercise       Continue Psychosocial Services   No Follow up required          Psychosocial Discharge (Final Psychosocial Re-Evaluation): Psychosocial Re-Evaluation - 07/04/18 1622      Psychosocial Re-Evaluation   Current issues with  None Identified    Comments  No interventions necessary.    Expected Outcomes  [name] will maintian a positive outlook.     Interventions  Encouraged to attend Cardiac Rehabilitation for the exercise    Continue Psychosocial Services   No Follow up required       Vocational Rehabilitation: Provide vocational rehab assistance to qualifying candidates.   Vocational Rehab Evaluation & Intervention:   Education: Education Goals: Education classes will be provided on a weekly basis, covering required topics. Participant will state understanding/return demonstration of topics presented.  Learning Barriers/Preferences: Learning Barriers/Preferences - 06/26/18 0840      Learning Barriers/Preferences   Learning Barriers  Sight    Learning Preferences  Written Material;Video;Pictoral;Computer/Internet       Education Topics: Count Your Pulse:  -Group instruction provided by verbal instruction, demonstration, patient participation and written materials to support subject.  Instructors address importance of being able to find your pulse and how to count your pulse when at home without a heart monitor.  Patients get hands on experience counting their pulse with staff help and individually.   Heart Attack, Angina, and Risk Factor Modification:  -Group instruction provided by verbal instruction, video, and written materials to support subject.  Instructors address signs and symptoms of angina and heart attacks.    Also discuss risk factors for heart disease and how to make changes  to improve heart health risk factors.   Functional Fitness:  -Group instruction provided by verbal instruction, demonstration, patient participation, and written materials to  support subject.  Instructors address safety measures for doing things around the house.  Discuss how to get up and down off the floor, how to pick things up properly, how to safely get out of a chair without assistance, and balance training.   Meditation and Mindfulness:  -Group instruction provided by verbal instruction, patient participation, and written materials to support subject.  Instructor addresses importance of mindfulness and meditation practice to help reduce stress and improve awareness.  Instructor also leads participants through a meditation exercise.    Stretching for Flexibility and Mobility:  -Group instruction provided by verbal instruction, patient participation, and written materials to support subject.  Instructors lead participants through series of stretches that are designed to increase flexibility thus improving mobility.  These stretches are additional exercise for major muscle groups that are typically performed during regular warm up and cool down.   Hands Only CPR:  -Group verbal, video, and participation provides a basic overview of AHA guidelines for community CPR. Role-play of emergencies allow participants the opportunity to practice calling for help and chest compression technique with discussion of AED use.   Hypertension: -Group verbal and written instruction that provides a basic overview of hypertension including the most recent diagnostic guidelines, risk factor reduction with self-care instructions and medication management.    Nutrition I class: Heart Healthy Eating:  -Group instruction provided by PowerPoint slides, verbal discussion, and written materials to support subject matter. The instructor gives an explanation and review of the Therapeutic Lifestyle Changes diet recommendations, which includes a discussion on lipid goals, dietary fat, sodium, fiber, plant stanol/sterol esters, sugar, and the components of a well-balanced, healthy  diet.   Nutrition II class: Lifestyle Skills:  -Group instruction provided by PowerPoint slides, verbal discussion, and written materials to support subject matter. The instructor gives an explanation and review of label reading, grocery shopping for heart health, heart healthy recipe modifications, and ways to make healthier choices when eating out.   Diabetes Question & Answer:  -Group instruction provided by PowerPoint slides, verbal discussion, and written materials to support subject matter. The instructor gives an explanation and review of diabetes co-morbidities, pre- and post-prandial blood glucose goals, pre-exercise blood glucose goals, signs, symptoms, and treatment of hypoglycemia and hyperglycemia, and foot care basics.   Diabetes Blitz:  -Group instruction provided by PowerPoint slides, verbal discussion, and written materials to support subject matter. The instructor gives an explanation and review of the physiology behind type 1 and type 2 diabetes, diabetes medications and rational behind using different medications, pre- and post-prandial blood glucose recommendations and Hemoglobin A1c goals, diabetes diet, and exercise including blood glucose guidelines for exercising safely.    Portion Distortion:  -Group instruction provided by PowerPoint slides, verbal discussion, written materials, and food models to support subject matter. The instructor gives an explanation of serving size versus portion size, changes in portions sizes over the last 20 years, and what consists of a serving from each food group.   Stress Management:  -Group instruction provided by verbal instruction, video, and written materials to support subject matter.  Instructors review role of stress in heart disease and how to cope with stress positively.     Exercising on Your Own:  -Group instruction provided by verbal instruction, power point, and written materials to support subject.  Instructors discuss  benefits of exercise, components of  exercise, frequency and intensity of exercise, and end points for exercise.  Also discuss use of nitroglycerin and activating EMS.  Review options of places to exercise outside of rehab.  Review guidelines for sex with heart disease.   Cardiac Drugs I:  -Group instruction provided by verbal instruction and written materials to support subject.  Instructor reviews cardiac drug classes: antiplatelets, anticoagulants, beta blockers, and statins.  Instructor discusses reasons, side effects, and lifestyle considerations for each drug class.   Cardiac Drugs II:  -Group instruction provided by verbal instruction and written materials to support subject.  Instructor reviews cardiac drug classes: angiotensin converting enzyme inhibitors (ACE-I), angiotensin II receptor blockers (ARBs), nitrates, and calcium channel blockers.  Instructor discusses reasons, side effects, and lifestyle considerations for each drug class.   Anatomy and Physiology of the Circulatory System:  Group verbal and written instruction and models provide basic cardiac anatomy and physiology, with the coronary electrical and arterial systems. Review of: AMI, Angina, Valve disease, Heart Failure, Peripheral Artery Disease, Cardiac Arrhythmia, Pacemakers, and the ICD.   Other Education:  -Group or individual verbal, written, or video instructions that support the educational goals of the cardiac rehab program.   Holiday Eating Survival Tips:  -Group instruction provided by PowerPoint slides, verbal discussion, and written materials to support subject matter. The instructor gives patients tips, tricks, and techniques to help them not only survive but enjoy the holidays despite the onslaught of food that accompanies the holidays.   Knowledge Questionnaire Score: Knowledge Questionnaire Score - 06/26/18 1121      Knowledge Questionnaire Score   Pre Score  21/24       Core Components/Risk  Factors/Patient Goals at Admission: Personal Goals and Risk Factors at Admission - 06/26/18 1148      Core Components/Risk Factors/Patient Goals on Admission    Weight Management  Yes;Weight Loss    Intervention  Weight Management: Develop a combined nutrition and exercise program designed to reach desired caloric intake, while maintaining appropriate intake of nutrient and fiber, sodium and fats, and appropriate energy expenditure required for the weight goal.;Weight Management: Provide education and appropriate resources to help participant work on and attain dietary goals.;Weight Management/Obesity: Establish reasonable short term and long term weight goals.    Admit Weight  175 lb 14.8 oz (79.8 kg)    Goal Weight: Long Term  165 lb (74.8 kg)    Expected Outcomes  Short Term: Continue to assess and modify interventions until short term weight is achieved;Long Term: Adherence to nutrition and physical activity/exercise program aimed toward attainment of established weight goal;Weight Loss: Understanding of general recommendations for a balanced deficit meal plan, which promotes 1-2 lb weight loss per week and includes a negative energy balance of [phone] kcal/d;Understanding recommendations for meals to include 15-35% energy as protein, 25-35% energy from fat, 35-60% energy from carbohydrates, less than 200mg  of dietary cholesterol, 20-35 gm of total fiber daily;Understanding of distribution of calorie intake throughout the day with the consumption of 4-5 meals/snacks       Core Components/Risk Factors/Patient Goals Review:  Goals and Risk Factor Review    Row Name 06/30/18 1442 07/04/18 1622           Core Components/Risk Factors/Patient Goals Review   Personal Goals Review  Weight Management/Obesity;Lipids;Hypertension;Diabetes  Weight Management/Obesity;Lipids;Hypertension;Diabetes      Review  Pt with multiple CAD RFs willing to participate in CR exercise. "[name]" would like to increase his  strength and stamina.  Pt with multiple CAD RFs  willing to participate in CR exercise. "[name]" would like to increase his strength and stamina.      Expected Outcomes  Pt will continue to participate in CR exercise, nutrition, and lifestyle modification opportunities.   Pt will continue to participate in CR exercise, nutrition, and lifestyle modification opportunities.          Core Components/Risk Factors/Patient Goals at Discharge (Final Review):  Goals and Risk Factor Review - 07/04/18 1622      Core Components/Risk Factors/Patient Goals Review   Personal Goals Review  Weight Management/Obesity;Lipids;Hypertension;Diabetes    Review  Pt with multiple CAD RFs willing to participate in CR exercise. "[name]" would like to increase his strength and stamina.    Expected Outcomes  Pt will continue to participate in CR exercise, nutrition, and lifestyle modification opportunities.        ITP Comments: ITP Comments    Row Name 06/26/18 0837 06/30/18 1103 07/04/18 1621       ITP Comments  Dr. Armanda Magic, Medical Director  Pt started exercise today and tolerated it well.   30 Day ITP Review.  [name] is off to a great start and is tolerating exercise well.         Comments: See ITP Comments.

## 2018-07-11 ENCOUNTER — Encounter (HOSPITAL_COMMUNITY)
Admission: RE | Admit: 2018-07-11 | Discharge: 2018-07-11 | Disposition: A | Discharge status: Home / Self Care | Payer: BLUE CROSS/BLUE SHIELD | Source: Ambulatory Visit | Attending: Cardiology | Admitting: Cardiology

## 2018-07-11 ENCOUNTER — Encounter (HOSPITAL_COMMUNITY): Payer: BLUE CROSS/BLUE SHIELD

## 2018-07-11 DIAGNOSIS — E108 Type 1 diabetes mellitus with unspecified complications: Secondary | ICD-10-CM | POA: Diagnosis not present

## 2018-07-11 DIAGNOSIS — Z794 Long term (current) use of insulin: Secondary | ICD-10-CM | POA: Diagnosis not present

## 2018-07-11 DIAGNOSIS — Z955 Presence of coronary angioplasty implant and graft: Secondary | ICD-10-CM | POA: Insufficient documentation

## 2018-07-14 ENCOUNTER — Encounter (HOSPITAL_COMMUNITY): Payer: BLUE CROSS/BLUE SHIELD

## 2018-07-14 DIAGNOSIS — E785 Hyperlipidemia, unspecified: Secondary | ICD-10-CM | POA: Diagnosis not present

## 2018-07-14 DIAGNOSIS — E1065 Type 1 diabetes mellitus with hyperglycemia: Secondary | ICD-10-CM | POA: Diagnosis not present

## 2018-07-14 DIAGNOSIS — E039 Hypothyroidism, unspecified: Secondary | ICD-10-CM | POA: Diagnosis not present

## 2018-07-16 ENCOUNTER — Encounter (HOSPITAL_COMMUNITY)
Admission: RE | Admit: 2018-07-16 | Discharge: 2018-07-16 | Disposition: A | Discharge status: Home / Self Care | Payer: BLUE CROSS/BLUE SHIELD | Source: Ambulatory Visit | Attending: Cardiology | Admitting: Cardiology

## 2018-07-16 ENCOUNTER — Encounter (HOSPITAL_COMMUNITY): Payer: BLUE CROSS/BLUE SHIELD

## 2018-07-16 DIAGNOSIS — Z955 Presence of coronary angioplasty implant and graft: Secondary | ICD-10-CM

## 2018-07-18 ENCOUNTER — Encounter (HOSPITAL_COMMUNITY): Payer: BLUE CROSS/BLUE SHIELD

## 2018-07-21 ENCOUNTER — Encounter (HOSPITAL_COMMUNITY): Payer: BLUE CROSS/BLUE SHIELD

## 2018-07-21 ENCOUNTER — Encounter (HOSPITAL_COMMUNITY)
Admission: RE | Admit: 2018-07-21 | Discharge: 2018-07-21 | Disposition: A | Discharge status: Home / Self Care | Payer: BLUE CROSS/BLUE SHIELD | Source: Ambulatory Visit | Attending: Cardiology | Admitting: Cardiology

## 2018-07-21 DIAGNOSIS — Z955 Presence of coronary angioplasty implant and graft: Secondary | ICD-10-CM | POA: Diagnosis not present

## 2018-07-21 NOTE — Progress Notes (Signed)
I have reviewed a Home Exercise Prescription with Mark Shah . Mark Shah is currently exercising at home. The patient was advised to continue to walk daily for 20-45 minutes.  Mark Shah and I discussed how to progress their exercise prescription.  The patient stated that they understand the exercise prescription.  We reviewed exercise guidelines, target heart rate during exercise, RPE Scale, weather conditions, NTG use, endpoints for exercise, warmup and cool down.  Patient is encouraged to come to me with any questions. I will continue to follow up with the patient to assist them with progression and safety.    Mark Ceriseyara R Reka Wist MS, ACSM CEP 07/21/2018 2:58 PM

## 2018-07-23 ENCOUNTER — Encounter (HOSPITAL_COMMUNITY): Payer: BLUE CROSS/BLUE SHIELD

## 2018-07-23 ENCOUNTER — Encounter (HOSPITAL_COMMUNITY)
Admission: RE | Admit: 2018-07-23 | Discharge: 2018-07-23 | Disposition: A | Discharge status: Home / Self Care | Payer: BLUE CROSS/BLUE SHIELD | Source: Ambulatory Visit | Attending: Cardiology | Admitting: Cardiology

## 2018-07-23 DIAGNOSIS — Z955 Presence of coronary angioplasty implant and graft: Secondary | ICD-10-CM

## 2018-07-25 ENCOUNTER — Encounter (HOSPITAL_COMMUNITY): Payer: BLUE CROSS/BLUE SHIELD

## 2018-07-28 ENCOUNTER — Encounter (HOSPITAL_COMMUNITY): Payer: BLUE CROSS/BLUE SHIELD

## 2018-07-30 ENCOUNTER — Encounter (HOSPITAL_COMMUNITY): Payer: BLUE CROSS/BLUE SHIELD

## 2018-08-01 ENCOUNTER — Encounter (HOSPITAL_COMMUNITY)
Admission: RE | Admit: 2018-08-01 | Discharge: 2018-08-01 | Disposition: A | Discharge status: Home / Self Care | Payer: BLUE CROSS/BLUE SHIELD | Source: Ambulatory Visit | Attending: Cardiology | Admitting: Cardiology

## 2018-08-01 ENCOUNTER — Encounter (HOSPITAL_COMMUNITY): Payer: BLUE CROSS/BLUE SHIELD

## 2018-08-01 DIAGNOSIS — Z955 Presence of coronary angioplasty implant and graft: Secondary | ICD-10-CM | POA: Diagnosis not present

## 2018-08-01 NOTE — Progress Notes (Signed)
Mark Shah 62 y.o. male Nutrition Note Spoke with pt. Nutrition plan and goals reviewed with pt. Pt is following heart healthy diet. Pt wants to maintain wt. Heart healthy weight maintenance tips reviewed today (label reading to identify and reduce unhealthy fats/sodium/refined carbs, how to build a healthy plate, portion sizes, eating frequently across the day). Pt shared he is interested in plant based eating style, discussed how to balance his carbohydrates healthy fats and still get in protein from various plant sources. Pt is diabetic. Pt reports last A1c 7.4, indicates blood glucose not well-controlled. Discussed trading refined carbs in diet for more complex carbohydrates, and educated patient on differences between complex and refined carbs. Pt continues to check CBG's 4-6 times a day. Fasting CBG's reportedly 110-140 mg/dL. Pt with dx of CHF.Per discussion, pt does not use canned /convenience foods often. Pt rarely adds salt to food. Pt eats out infrequently. Pt expressed understanding of the information reviewed. Pt aware of nutrition education classes offered and plans on attending nutrition classes.  No results found for: HGBA1C  Wt Readings from Last 3 Encounters:  06/26/18 175 lb 14.8 oz (79.8 kg)  05/13/18 175 lb (79.4 kg)  04/12/17 194 lb (88 kg)    Nutrition Diagnosis  Food-and nutrition-related knowledge deficit related to lack of exposure to information as related to diagnosis of: ? CVD ? DM   Nutrition Intervention ? Pt's individual nutrition plan reviewed with pt.  Goal(s)  Pt to identify and limit food sources of saturated fat, trans fat, refined carbohydrates and sodium  Improved blood glucose control as evidenced by pt's A1c trending from 7.4 toward less than 7.0.  Pt able to name foods that affect blood glucose.  Plan:   Pt to attend nutrition classes ? Nutrition I ? Nutrition II ? Portion Distortion   Will provide client-centered nutrition education as  part of interdisciplinary care  Monitor and evaluate progress toward nutrition goal with team.    Ross Marcus, MS, RD, LDN 08/01/2018 8:42 AM

## 2018-08-04 ENCOUNTER — Encounter (HOSPITAL_COMMUNITY): Payer: BLUE CROSS/BLUE SHIELD

## 2018-08-04 ENCOUNTER — Encounter (HOSPITAL_COMMUNITY)
Admission: RE | Admit: 2018-08-04 | Discharge: 2018-08-04 | Disposition: A | Discharge status: Home / Self Care | Payer: BLUE CROSS/BLUE SHIELD | Source: Ambulatory Visit | Attending: Cardiology | Admitting: Cardiology

## 2018-08-04 DIAGNOSIS — Z955 Presence of coronary angioplasty implant and graft: Secondary | ICD-10-CM

## 2018-08-06 ENCOUNTER — Encounter (HOSPITAL_COMMUNITY): Payer: BLUE CROSS/BLUE SHIELD

## 2018-08-06 ENCOUNTER — Encounter (HOSPITAL_COMMUNITY): Payer: Self-pay

## 2018-08-06 ENCOUNTER — Telehealth (HOSPITAL_COMMUNITY): Payer: Self-pay | Admitting: Family Medicine

## 2018-08-07 NOTE — Progress Notes (Signed)
Cardiac Individual Treatment Plan  Patient Details  Name: Mark Shah MRN: [id number] Date of Birth: 1956-08-23 Referring Provider:     CARDIAC REHAB PHASE II ORIENTATION from 06/26/2018 in [facility] CARDIAC REHAB  Referring Provider  Viann Fish MD       Initial Encounter Date:    CARDIAC REHAB PHASE II ORIENTATION from 06/26/2018 in [facility] CARDIAC REHAB  Date  06/26/18      Visit Diagnosis: 05/13/18 Status post coronary artery stent placement DES x 2 LAD, diag  Patient's Home Medications on Admission:  Current Outpatient Medications:  .  Cholecalciferol (VITAMIN D3) 3000 units TABS, Take 1 tablet by mouth daily., Disp: , Rfl:  .  clopidogrel (PLAVIX) 75 MG tablet, Take 1 tablet (75 mg total) by mouth daily., Disp: 30 tablet, Rfl: 0 .  fluticasone (FLONASE) 50 MCG/ACT nasal spray, USE 1 SPRAY IN EACH NOSTRIL DAILY PRN FOR ALLERGIES, Disp: , Rfl: 1 .  Glucagon, rDNA, (GLUCAGON EMERGENCY IJ), Inject 1 mg as directed as needed (as directed for diabetes.Marland Kitchen)., Disp: , Rfl:  .  insulin glargine (LANTUS) 100 UNIT/ML injection, Inject 11 Units into the skin See admin instructions. ONLY IF INSULIN PUMP CAN NOT BE USED , Disp: , Rfl:  .  insulin lispro (HUMALOG) 100 UNIT/ML injection, Inject into the skin as directed. As directed with insulin pump. No unites given., Disp: , Rfl:  .  loratadine (CLARITIN) 10 MG tablet, Take 10 mg by mouth daily as needed for allergies., Disp: , Rfl:  .  metoprolol succinate (TOPROL-XL) 25 MG 24 hr tablet, Take 25 mg by mouth daily., Disp: , Rfl: 1 .  nitroGLYCERIN (NITROSTAT) 0.4 MG SL tablet, Place 1 tablet under the tongue every 5 (five) minutes as needed for chest pain. , Disp: , Rfl: 0 .  rosuvastatin (CRESTOR) 10 MG tablet, Take 10 mg by mouth daily., Disp: , Rfl:  .  sildenafil (VIAGRA) 100 MG tablet, Take 50 mg by mouth daily as needed for erectile dysfunction. , Disp: , Rfl:  .  SYNTHROID 175 MCG tablet,  Take 175 mcg by mouth daily before breakfast. , Disp: , Rfl:  .  telmisartan (MICARDIS) 40 MG tablet, Take 40 mg by mouth daily., Disp: , Rfl:  .  ZETIA 10 MG tablet, Take 1 tablet by mouth daily., Disp: , Rfl:  .  zolpidem (AMBIEN) 10 MG tablet, Take 5 mg by mouth at bedtime as needed for sleep. , Disp: , Rfl: 0  Past Medical History: Past Medical History:  Diagnosis Date  . Adhesive capsulitis    in shoulders  . Diabetes mellitus without complication (HCC)   . Diverticulitis   . Dyslipidemia   . ED (erectile dysfunction)   . HTN (hypertension)   . Hyperlipidemia   . Hypothyroidism   . PONV (postoperative nausea and vomiting)     Tobacco Use: Social History   Tobacco Use  Smoking Status Former Smoker  Smokeless Tobacco Never Used    Labs: Recent Review Flowsheet Data    There is no flowsheet data to display.      Capillary Blood Glucose: Lab Results  Component Value Date   GLUCAP 156 (H) 06/30/2018   GLUCAP 185 (H) 05/13/2018   GLUCAP 239 (H) 05/13/2018   GLUCAP 72 03/07/2016   GLUCAP 59 (L) 03/07/2016     Exercise Target Goals: Exercise Program Goal: Individual exercise prescription set using results from initial 6 min walk test and THRR while considering  patient's  activity barriers and safety.   Exercise Prescription Goal: Initial exercise prescription builds to 30-45 minutes a day of aerobic activity, 2-3 days per week.  Home exercise guidelines will be given to patient during program as part of exercise prescription that the participant will acknowledge.  Activity Barriers & Risk Stratification: Activity Barriers & Cardiac Risk Stratification - 06/26/18 0839      Activity Barriers & Cardiac Risk Stratification   Activity Barriers  Deconditioning;Back Problems    Cardiac Risk Stratification  High       6 Minute Walk: 6 Minute Walk    Row Name 06/26/18 1127         6 Minute Walk   Phase  Initial     Distance  1619 feet     Walk Time  6 minutes      # of Rest Breaks  0     MPH  3.1     METS  3.94     RPE  9     Perceived Dyspnea   0     VO2 Peak  13.79     Symptoms  No     Resting HR  79 bpm     Resting BP  102/62     Resting Oxygen Saturation   99 %     Exercise Oxygen Saturation  during 6 min walk  100 %     Max Ex. HR  89 bpm     Max Ex. BP  118/60     2 Minute Post BP  115/74        Oxygen Initial Assessment:   Oxygen Re-Evaluation:   Oxygen Discharge (Final Oxygen Re-Evaluation):   Initial Exercise Prescription: Initial Exercise Prescription - 06/26/18 1100      Date of Initial Exercise RX and Referring Provider   Date  06/26/18    Referring Provider  Viann Fish MD     Expected Discharge Date  09/18/18      Treadmill   MPH  3.2    Grade  1    Minutes  10    METs  3.89      NuStep   Level  2    SPM  80    Minutes  10    METs  3      Rower   Level  3    Watts  25    Minutes  10    METs  4      Prescription Details   Frequency (times per week)  3x    Duration  Progress to 30 minutes of continuous aerobic without signs/symptoms of physical distress      Intensity   THRR 40-80% of Max Heartrate  63-126    Ratings of Perceived Exertion  11-13    Perceived Dyspnea  0-4      Progression   Progression  Continue progressive overload as per policy without signs/symptoms or physical distress.      Resistance Training   Training Prescription  Yes    Weight  4lbs    Reps  10-15       Perform Capillary Blood Glucose checks as needed.  Exercise Prescription Changes: Exercise Prescription Changes    Row Name 06/30/18 1300 07/23/18 1118 08/06/18 1700         Response to Exercise   Blood Pressure (Admit)  112/70  120/78  123/70     Blood Pressure (Exercise)  126/70  128/70  150/78  Blood Pressure (Exit)  128/4  102/60  130/70     Heart Rate (Admit)  75 bpm  83 bpm  80 bpm     Heart Rate (Exercise)  101 bpm  136 bpm  137 bpm     Heart Rate (Exit)  75 bpm  81 bpm  97 bpm      Rating of Perceived Exertion (Exercise)  13  13  13      Perceived Dyspnea (Exercise)  0  0  0     Symptoms  None   None   None     Comments  Pt oriented to exercise equipment   -  -     Duration  Progress to 30 minutes of  aerobic without signs/symptoms of physical distress  Continue with 30 min of aerobic exercise without signs/symptoms of physical distress.  Continue with 30 min of aerobic exercise without signs/symptoms of physical distress.     Intensity  THRR New  THRR unchanged  THRR unchanged       Progression   Progression  Continue to progress workloads to maintain intensity without signs/symptoms of physical distress.  Continue to progress workloads to maintain intensity without signs/symptoms of physical distress.  Continue to progress workloads to maintain intensity without signs/symptoms of physical distress.     Average METs  3.59  5.85  5.28       Resistance Training   Training Prescription  Yes  No  Yes     Weight  4lbs  4lbs  6lbs     Reps  10-15  10-15  10-15     Time  10 Minutes  10 Minutes  10 Minutes       Interval Training   Interval Training  No  No  No       Treadmill   MPH  3.2  3.5  3.5     Grade  1  2  2      Minutes  10  10  10      METs  3.89  4.65  4.65       NuStep   Level  2  6  6      SPM  80  95  100     Minutes  10  10  10      METs  2.3  5.7  4.6       Rower   Level  3  5  5      Watts  26  88  68     Minutes  10  10  10      METs  4.6  7.2  6.6       Home Exercise Plan   Plans to continue exercise at  -  Home (comment) Walking  Home (comment) Walking     Frequency  -  Add 2 additional days to program exercise sessions.  Add 2 additional days to program exercise sessions.     Initial Home Exercises Provided  -  07/11/18  07/11/18        Exercise Comments: Exercise Comments    Row Name 06/30/18 1344 07/21/18 1505         Exercise Comments  Pt's first day of exercise. Pt oriented to exercise equipment. Pt responded well to exercise  prescription. Will continue to monitor and progress pt as tolerated.   Reviewed HEP with pt. Pt is continuing to respond well to workloads. Will continue to monitor and progress pt as tolerated.  Exercise Goals and Review: Exercise Goals    Row Name 06/26/18 0839             Exercise Goals   Increase Physical Activity  Yes       Intervention  Provide advice, education, support and counseling about physical activity/exercise needs.;Develop an individualized exercise prescription for aerobic and resistive training based on initial evaluation findings, risk stratification, comorbidities and participant's personal goals.       Expected Outcomes  Short Term: Attend rehab on a regular basis to increase amount of physical activity.;Long Term: Exercising regularly at least 3-5 days a week.;Long Term: Add in home exercise to make exercise part of routine and to increase amount of physical activity.       Increase Strength and Stamina  Yes get back to jogging/running       Intervention  Provide advice, education, support and counseling about physical activity/exercise needs.;Develop an individualized exercise prescription for aerobic and resistive training based on initial evaluation findings, risk stratification, comorbidities and participant's personal goals.       Expected Outcomes  Short Term: Increase workloads from initial exercise prescription for resistance, speed, and METs.;Short Term: Perform resistance training exercises routinely during rehab and add in resistance training at home;Long Term: Improve cardiorespiratory fitness, muscular endurance and strength as measured by increased METs and functional capacity ( )       Able to understand and use rate of perceived exertion (RPE) scale  Yes       Intervention  Provide education and explanation on how to use RPE scale       Expected Outcomes  Short Term: Able to use RPE daily in rehab to express subjective intensity level;Long Term:   Able to use RPE to guide intensity level when exercising independently       Knowledge and understanding of Target Heart Rate Range (THRR)  Yes       Intervention  Provide education and explanation of THRR including how the numbers were predicted and where they are located for reference       Expected Outcomes  Short Term: Able to state/look up THRR;Long Term: Able to use THRR to govern intensity when exercising independently;Short Term: Able to use daily as guideline for intensity in rehab       Able to check pulse independently  Yes       Intervention  Provide education and demonstration on how to check pulse in carotid and radial arteries.;Review the importance of being able to check your own pulse for safety during independent exercise       Expected Outcomes  Short Term: Able to explain why pulse checking is important during independent exercise;Long Term: Able to check pulse independently and accurately       Understanding of Exercise Prescription  Yes       Intervention  Provide education, explanation, and written materials on patient's individual exercise prescription       Expected Outcomes  Short Term: Able to explain program exercise prescription;Long Term: Able to explain home exercise prescription to exercise independently          Exercise Goals Re-Evaluation : Exercise Goals Re-Evaluation    Row Name 07/21/18 1459             Exercise Goal Re-Evaluation   Exercise Goals Review  Understanding of Exercise Prescription;Knowledge and understanding of Target Heart Rate Range (THRR);Able to understand and use rate of perceived exertion (RPE) scale;Increase Physical Activity;Increase Strength and Stamina;Able to check pulse  independently       Comments  Reviewed HEP with pt. Also reviewed THRR, RPE Scale, weather precautions, endpoints of exericse, NTG use, warmup and cool down.        Expected Outcomes  Pt will continue to walk for exercise daily. Pt will continue to increase  cardiovasular fitness. Will continue to monitor and progress pt.           Discharge Exercise Prescription (Final Exercise Prescription Changes): Exercise Prescription Changes - 08/06/18 1700      Response to Exercise   Blood Pressure (Admit)  123/70    Blood Pressure (Exercise)  150/78    Blood Pressure (Exit)  130/70    Heart Rate (Admit)  80 bpm    Heart Rate (Exercise)  137 bpm    Heart Rate (Exit)  97 bpm    Rating of Perceived Exertion (Exercise)  13    Perceived Dyspnea (Exercise)  0    Symptoms  None    Duration  Continue with 30 min of aerobic exercise without signs/symptoms of physical distress.    Intensity  THRR unchanged      Progression   Progression  Continue to progress workloads to maintain intensity without signs/symptoms of physical distress.    Average METs  5.28      Resistance Training   Training Prescription  Yes    Weight  6lbs    Reps  10-15    Time  10 Minutes      Interval Training   Interval Training  No      Treadmill   MPH  3.5    Grade  2    Minutes  10    METs  4.65      NuStep   Level  6    SPM  100    Minutes  10    METs  4.6      Rower   Level  5    Watts  68    Minutes  10    METs  6.6      Home Exercise Plan   Plans to continue exercise at  Home (comment)   Walking   Frequency  Add 2 additional days to program exercise sessions.    Initial Home Exercises Provided  07/11/18       Nutrition:  Target Goals: Understanding of nutrition guidelines, daily intake of sodium 1500mg , cholesterol 200mg , calories 30% from fat and 7% or less from saturated fats, daily to have 5 or more servings of fruits and vegetables.  Biometrics: Pre Biometrics - 06/26/18 1146      Pre Biometrics   Height  5' 10.75" (1.797 m)    Weight  79.8 kg    Waist Circumference  36 inches    Hip Circumference  38.5 inches    Waist to Hip Ratio  0.94 %    BMI (Calculated)  24.71    Triceps Skinfold  26 mm    % Body Fat  26.4 %    Grip Strength   41 kg    Flexibility  9 in    Single Leg Stand  30 seconds        Nutrition Therapy Plan and Nutrition Goals: Nutrition Therapy & Goals - 06/26/18 0902      Nutrition Therapy   Diet  consistent carbohydrate heart healthy      Personal Nutrition Goals   Nutrition Goal  pt able to name foods  that affect blood glucose  Personal Goal #2  pt to identify and limit food sources of sodium, saturated fat, trans fats, refined carbohydrates    Personal Goal #3  imporved blood glucose control as evidenced by pt's A1c trending from 7.4 toward less than 7.0      Intervention Plan   Intervention  Prescribe, educate and counsel regarding individualized specific dietary modifications aiming towards targeted core components such as weight, hypertension, lipid management, diabetes, heart failure and other comorbidities.    Expected Outcomes  Short Term Goal: Understand basic principles of dietary content, such as calories, fat, sodium, cholesterol and nutrients.       Nutrition Assessments: Nutrition Assessments - 06/26/18 0904      MEDFICTS Scores   Pre Score  9       Nutrition Goals Re-Evaluation:   Nutrition Goals Re-Evaluation:   Nutrition Goals Discharge (Final Nutrition Goals Re-Evaluation):   Psychosocial: Target Goals: Acknowledge presence or absence of significant depression and/or stress, maximize coping skills, provide positive support system. Participant is able to verbalize types and ability to use techniques and skills needed for reducing stress and depression.  Initial Review & Psychosocial Screening: Initial Psych Review & Screening - 06/26/18 1227      Initial Review   Current issues with  None Identified      Family Dynamics   Good Support System?  Yes   Pt reports his wife and friends as sources of support for him.     Barriers   Psychosocial barriers to participate in program  There are no identifiable barriers or psychosocial needs.      Screening  Interventions   Interventions  Encouraged to exercise       Quality of Life Scores: Quality of Life - 06/26/18 1121      Quality of Life   Select  Quality of Life      Quality of Life Scores   Health/Function Pre  20.9 %    Socioeconomic Pre  24.36 %    Psych/Spiritual Pre  20.25 %    Family Pre  26.4 %    GLOBAL Pre  22.35 %      Scores of 19 and below usually indicate a poorer quality of life in these areas.  A difference of  2-3 points is a clinically meaningful difference.  A difference of 2-3 points in the total score of the Quality of Life Index has been associated with significant improvement in overall quality of life, self-image, physical symptoms, and general health in studies assessing change in quality of life.  PHQ-9: Recent Review Flowsheet Data    Depression screen [facility] 2/9 06/30/2018 11/18/2015   Decreased Interest 0 0   Down, Depressed, Hopeless 0 0   PHQ - 2 Score 0 0     Interpretation of Total Score  Total Score Depression Severity:  1-4 = Minimal depression, 5-9 = Mild depression, 10-14 = Moderate depression, 15-19 = Moderately severe depression, 20-27 = Severe depression   Psychosocial Evaluation and Intervention: Psychosocial Evaluation - 06/30/18 1440      Psychosocial Evaluation & Interventions   Interventions  Encouraged to exercise with the program and follow exercise prescription    Comments  No psychosocial needs identified. "Mark Shah" enjoys traveling, going to [employer], fishing, and reading.    Expected Outcomes  "Mark Shah" will continue to exhibit a positive outlook with good coping skills.    Continue Psychosocial Services   No Follow up required       Psychosocial Re-Evaluation: Psychosocial  Re-Evaluation    Row Name 07/04/18 1622 08/06/18 1742           Psychosocial Re-Evaluation   Current issues with  None Identified  None Identified      Comments  No interventions necessary.  No interventions necessary.      Expected Outcomes  Mark Shah will  maintian a positive outlook.   Mark Shah will maintian a positive outlook.       Interventions  Encouraged to attend Cardiac Rehabilitation for the exercise  Encouraged to attend Cardiac Rehabilitation for the exercise      Continue Psychosocial Services   No Follow up required  No Follow up required         Psychosocial Discharge (Final Psychosocial Re-Evaluation): Psychosocial Re-Evaluation - 08/06/18 1742      Psychosocial Re-Evaluation   Current issues with  None Identified    Comments  No interventions necessary.    Expected Outcomes  Mark Shah will maintian a positive outlook.     Interventions  Encouraged to attend Cardiac Rehabilitation for the exercise    Continue Psychosocial Services   No Follow up required       Vocational Rehabilitation: Provide vocational rehab assistance to qualifying candidates.   Vocational Rehab Evaluation & Intervention:   Education: Education Goals: Education classes will be provided on a weekly basis, covering required topics. Participant will state understanding/return demonstration of topics presented.  Learning Barriers/Preferences: Learning Barriers/Preferences - 06/26/18 0840      Learning Barriers/Preferences   Learning Barriers  Sight    Learning Preferences  Written Material;Video;Pictoral;Computer/Internet       Education Topics: Count Your Pulse:  -Group instruction provided by verbal instruction, demonstration, patient participation and written materials to support subject.  Instructors address importance of being able to find your pulse and how to count your pulse when at home without a heart monitor.  Patients get hands on experience counting their pulse with staff help and individually.   Heart Attack, Angina, and Risk Factor Modification:  -Group instruction provided by verbal instruction, video, and written materials to support subject.  Instructors address signs and symptoms of angina and heart attacks.    Also discuss risk factors  for heart disease and how to make changes to improve heart health risk factors.   Functional Fitness:  -Group instruction provided by verbal instruction, demonstration, patient participation, and written materials to support subject.  Instructors address safety measures for doing things around the house.  Discuss how to get up and down off the floor, how to pick things up properly, how to safely get out of a chair without assistance, and balance training.   Meditation and Mindfulness:  -Group instruction provided by verbal instruction, patient participation, and written materials to support subject.  Instructor addresses importance of mindfulness and meditation practice to help reduce stress and improve awareness.  Instructor also leads participants through a meditation exercise.    Stretching for Flexibility and Mobility:  -Group instruction provided by verbal instruction, patient participation, and written materials to support subject.  Instructors lead participants through series of stretches that are designed to increase flexibility thus improving mobility.  These stretches are additional exercise for major muscle groups that are typically performed during regular warm up and cool down.   Hands Only CPR:  -Group verbal, video, and participation provides a basic overview of AHA guidelines for community CPR. Role-play of emergencies allow participants the opportunity to practice calling for help and chest compression technique with discussion of AED use.  Hypertension: -Group verbal and written instruction that provides a basic overview of hypertension including the most recent diagnostic guidelines, risk factor reduction with self-care instructions and medication management.    Nutrition I class: Heart Healthy Eating:  -Group instruction provided by PowerPoint slides, verbal discussion, and written materials to support subject matter. The instructor gives an explanation and review of the  Therapeutic Lifestyle Changes diet recommendations, which includes a discussion on lipid goals, dietary fat, sodium, fiber, plant stanol/sterol esters, sugar, and the components of a well-balanced, healthy diet.   Nutrition II class: Lifestyle Skills:  -Group instruction provided by PowerPoint slides, verbal discussion, and written materials to support subject matter. The instructor gives an explanation and review of label reading, grocery shopping for heart health, heart healthy recipe modifications, and ways to make healthier choices when eating out.   Diabetes Question & Answer:  -Group instruction provided by PowerPoint slides, verbal discussion, and written materials to support subject matter. The instructor gives an explanation and review of diabetes co-morbidities, pre- and post-prandial blood glucose goals, pre-exercise blood glucose goals, signs, symptoms, and treatment of hypoglycemia and hyperglycemia, and foot care basics.   Diabetes Blitz:  -Group instruction provided by PowerPoint slides, verbal discussion, and written materials to support subject matter. The instructor gives an explanation and review of the physiology behind type 1 and type 2 diabetes, diabetes medications and rational behind using different medications, pre- and post-prandial blood glucose recommendations and Hemoglobin A1c goals, diabetes diet, and exercise including blood glucose guidelines for exercising safely.    Portion Distortion:  -Group instruction provided by PowerPoint slides, verbal discussion, written materials, and food models to support subject matter. The instructor gives an explanation of serving size versus portion size, changes in portions sizes over the last 20 years, and what consists of a serving from each food group.   Stress Management:  -Group instruction provided by verbal instruction, video, and written materials to support subject matter.  Instructors review role of stress in heart  disease and how to cope with stress positively.     CARDIAC REHAB PHASE II EXERCISE from 07/23/2018 in [facility] CARDIAC REHAB  Date  07/23/18  Educator  RN  Instruction Review Code  2- Demonstrated Understanding      Exercising on Your Own:  -Group instruction provided by verbal instruction, power point, and written materials to support subject.  Instructors discuss benefits of exercise, components of exercise, frequency and intensity of exercise, and end points for exercise.  Also discuss use of nitroglycerin and activating EMS.  Review options of places to exercise outside of rehab.  Review guidelines for sex with heart disease.   Cardiac Drugs I:  -Group instruction provided by verbal instruction and written materials to support subject.  Instructor reviews cardiac drug classes: antiplatelets, anticoagulants, beta blockers, and statins.  Instructor discusses reasons, side effects, and lifestyle considerations for each drug class.   CARDIAC REHAB PHASE II EXERCISE from 07/23/2018 in [facility] CARDIAC REHAB  Date  07/16/18  Instruction Review Code  2- Demonstrated Understanding      Cardiac Drugs II:  -Group instruction provided by verbal instruction and written materials to support subject.  Instructor reviews cardiac drug classes: angiotensin converting enzyme inhibitors (ACE-I), angiotensin II receptor blockers (ARBs), nitrates, and calcium channel blockers.  Instructor discusses reasons, side effects, and lifestyle considerations for each drug class.   Anatomy and Physiology of the Circulatory System:  Group verbal and written instruction and models provide basic  cardiac anatomy and physiology, with the coronary electrical and arterial systems. Review of: AMI, Angina, Valve disease, Heart Failure, Peripheral Artery Disease, Cardiac Arrhythmia, Pacemakers, and the ICD.   Other Education:  -Group or individual verbal, written, or video instructions  that support the educational goals of the cardiac rehab program.   Holiday Eating Survival Tips:  -Group instruction provided by PowerPoint slides, verbal discussion, and written materials to support subject matter. The instructor gives patients tips, tricks, and techniques to help them not only survive but enjoy the holidays despite the onslaught of food that accompanies the holidays.   Knowledge Questionnaire Score: Knowledge Questionnaire Score - 06/26/18 1121      Knowledge Questionnaire Score   Pre Score  21/24       Core Components/Risk Factors/Patient Goals at Admission: Personal Goals and Risk Factors at Admission - 06/26/18 1148      Core Components/Risk Factors/Patient Goals on Admission    Weight Management  Yes;Weight Loss    Intervention  Weight Management: Develop a combined nutrition and exercise program designed to reach desired caloric intake, while maintaining appropriate intake of nutrient and fiber, sodium and fats, and appropriate energy expenditure required for the weight goal.;Weight Management: Provide education and appropriate resources to help participant work on and attain dietary goals.;Weight Management/Obesity: Establish reasonable short term and long term weight goals.    Admit Weight  175 lb 14.8 oz (79.8 kg)    Goal Weight: Long Term  165 lb (74.8 kg)    Expected Outcomes  Short Term: Continue to assess and modify interventions until short term weight is achieved;Long Term: Adherence to nutrition and physical activity/exercise program aimed toward attainment of established weight goal;Weight Loss: Understanding of general recommendations for a balanced deficit meal plan, which promotes 1-2 lb weight loss per week and includes a negative energy balance of [phone] kcal/d;Understanding recommendations for meals to include 15-35% energy as protein, 25-35% energy from fat, 35-60% energy from carbohydrates, less than 200mg  of dietary cholesterol, 20-35 gm of total  fiber daily;Understanding of distribution of calorie intake throughout the day with the consumption of 4-5 meals/snacks       Core Components/Risk Factors/Patient Goals Review:  Goals and Risk Factor Review    Row Name 06/30/18 1442 07/04/18 1622 08/06/18 1742         Core Components/Risk Factors/Patient Goals Review   Personal Goals Review  Weight Management/Obesity;Lipids;Hypertension;Diabetes  Weight Management/Obesity;Lipids;Hypertension;Diabetes  Weight Management/Obesity;Lipids;Hypertension;Diabetes     Review  Pt with multiple CAD RFs willing to participate in CR exercise. "Mark Shah" would like to increase his strength and stamina.  Pt with multiple CAD RFs willing to participate in CR exercise. "Mark Shah" would like to increase his strength and stamina.  Pt with multiple CAD RFs willing to participate in CR exercise.  Mark Shah is doing well with exercise.  He feels that he is increasing his strength.      Expected Outcomes  Pt will continue to participate in CR exercise, nutrition, and lifestyle modification opportunities.   Pt will continue to participate in CR exercise, nutrition, and lifestyle modification opportunities.   Pt will continue to participate in CR exercise, nutrition, and lifestyle modification opportunities.         Core Components/Risk Factors/Patient Goals at Discharge (Final Review):  Goals and Risk Factor Review - 08/06/18 1742      Core Components/Risk Factors/Patient Goals Review   Personal Goals Review  Weight Management/Obesity;Lipids;Hypertension;Diabetes    Review  Pt with multiple CAD RFs willing to participate  in CR exercise.  Mark Shah is doing well with exercise.  He feels that he is increasing his strength.     Expected Outcomes  Pt will continue to participate in CR exercise, nutrition, and lifestyle modification opportunities.        ITP Comments: ITP Comments    Row Name 06/26/18 0837 06/30/18 1103 07/04/18 1621 08/06/18 1741     ITP Comments  Dr. Armanda Magic,  Medical Director  Pt started exercise today and tolerated it well.   30 Day ITP Review.  Mark Shah is off to a great start and is tolerating exercise well.   30 Day ITP Review.  Mark Shah is doing well.  He feels that he is increasing his strength. VSS.       Comments: See ITP Comments.

## 2018-08-08 ENCOUNTER — Encounter (HOSPITAL_COMMUNITY)
Admission: RE | Admit: 2018-08-08 | Discharge: 2018-08-08 | Disposition: A | Discharge status: Home / Self Care | Payer: BLUE CROSS/BLUE SHIELD | Source: Ambulatory Visit | Attending: Cardiology | Admitting: Cardiology

## 2018-08-08 ENCOUNTER — Encounter (HOSPITAL_COMMUNITY): Payer: BLUE CROSS/BLUE SHIELD

## 2018-08-08 DIAGNOSIS — Z955 Presence of coronary angioplasty implant and graft: Secondary | ICD-10-CM

## 2018-08-11 ENCOUNTER — Encounter (HOSPITAL_COMMUNITY)
Admission: RE | Admit: 2018-08-11 | Discharge: 2018-08-11 | Disposition: A | Discharge status: Home / Self Care | Payer: BLUE CROSS/BLUE SHIELD | Source: Ambulatory Visit | Attending: Cardiology | Admitting: Cardiology

## 2018-08-11 ENCOUNTER — Encounter (HOSPITAL_COMMUNITY): Payer: BLUE CROSS/BLUE SHIELD

## 2018-08-11 DIAGNOSIS — Z955 Presence of coronary angioplasty implant and graft: Secondary | ICD-10-CM | POA: Diagnosis not present

## 2018-08-13 ENCOUNTER — Encounter (HOSPITAL_COMMUNITY): Payer: BLUE CROSS/BLUE SHIELD

## 2018-08-15 ENCOUNTER — Encounter (HOSPITAL_COMMUNITY): Payer: BLUE CROSS/BLUE SHIELD

## 2018-08-18 ENCOUNTER — Encounter (HOSPITAL_COMMUNITY)
Admission: RE | Admit: 2018-08-18 | Discharge: 2018-08-18 | Disposition: A | Discharge status: Home / Self Care | Payer: BLUE CROSS/BLUE SHIELD | Source: Ambulatory Visit | Attending: Cardiology | Admitting: Cardiology

## 2018-08-18 ENCOUNTER — Encounter (HOSPITAL_COMMUNITY): Payer: BLUE CROSS/BLUE SHIELD

## 2018-08-18 DIAGNOSIS — Z955 Presence of coronary angioplasty implant and graft: Secondary | ICD-10-CM | POA: Diagnosis not present

## 2018-08-20 ENCOUNTER — Encounter (HOSPITAL_COMMUNITY)
Admission: RE | Admit: 2018-08-20 | Discharge: 2018-08-20 | Disposition: A | Discharge status: Home / Self Care | Payer: BLUE CROSS/BLUE SHIELD | Source: Ambulatory Visit | Attending: Cardiology | Admitting: Cardiology

## 2018-08-20 ENCOUNTER — Encounter (HOSPITAL_COMMUNITY): Payer: BLUE CROSS/BLUE SHIELD

## 2018-08-20 DIAGNOSIS — Z955 Presence of coronary angioplasty implant and graft: Secondary | ICD-10-CM | POA: Diagnosis not present

## 2018-08-22 ENCOUNTER — Encounter (HOSPITAL_COMMUNITY)
Admission: RE | Admit: 2018-08-22 | Discharge: 2018-08-22 | Disposition: A | Discharge status: Home / Self Care | Payer: BLUE CROSS/BLUE SHIELD | Source: Ambulatory Visit | Attending: Cardiology | Admitting: Cardiology

## 2018-08-22 ENCOUNTER — Encounter (HOSPITAL_COMMUNITY): Payer: BLUE CROSS/BLUE SHIELD

## 2018-08-22 DIAGNOSIS — Z955 Presence of coronary angioplasty implant and graft: Secondary | ICD-10-CM | POA: Diagnosis not present

## 2018-08-25 ENCOUNTER — Encounter (HOSPITAL_COMMUNITY): Payer: BLUE CROSS/BLUE SHIELD

## 2018-08-25 ENCOUNTER — Encounter (HOSPITAL_COMMUNITY)
Admission: RE | Admit: 2018-08-25 | Discharge: 2018-08-25 | Disposition: A | Discharge status: Home / Self Care | Payer: BLUE CROSS/BLUE SHIELD | Source: Ambulatory Visit | Attending: Cardiology | Admitting: Cardiology

## 2018-08-25 DIAGNOSIS — Z955 Presence of coronary angioplasty implant and graft: Secondary | ICD-10-CM | POA: Diagnosis not present

## 2018-08-27 ENCOUNTER — Encounter (HOSPITAL_COMMUNITY): Payer: BLUE CROSS/BLUE SHIELD

## 2018-08-29 ENCOUNTER — Encounter (HOSPITAL_COMMUNITY): Payer: BLUE CROSS/BLUE SHIELD

## 2018-08-29 ENCOUNTER — Encounter (HOSPITAL_COMMUNITY): Payer: Self-pay

## 2018-08-29 ENCOUNTER — Encounter (HOSPITAL_COMMUNITY)
Admission: RE | Admit: 2018-08-29 | Discharge: 2018-08-29 | Disposition: A | Discharge status: Home / Self Care | Payer: BLUE CROSS/BLUE SHIELD | Source: Ambulatory Visit | Attending: Cardiology | Admitting: Cardiology

## 2018-08-29 DIAGNOSIS — Z955 Presence of coronary angioplasty implant and graft: Secondary | ICD-10-CM

## 2018-09-01 ENCOUNTER — Encounter (HOSPITAL_COMMUNITY): Payer: BLUE CROSS/BLUE SHIELD

## 2018-09-01 ENCOUNTER — Encounter (HOSPITAL_COMMUNITY)
Admission: RE | Admit: 2018-09-01 | Discharge: 2018-09-01 | Disposition: A | Discharge status: Home / Self Care | Payer: BLUE CROSS/BLUE SHIELD | Source: Ambulatory Visit | Attending: Cardiology | Admitting: Cardiology

## 2018-09-01 DIAGNOSIS — Z955 Presence of coronary angioplasty implant and graft: Secondary | ICD-10-CM

## 2018-09-03 ENCOUNTER — Encounter (HOSPITAL_COMMUNITY): Payer: BLUE CROSS/BLUE SHIELD

## 2018-09-04 DIAGNOSIS — E109 Type 1 diabetes mellitus without complications: Secondary | ICD-10-CM | POA: Diagnosis not present

## 2018-09-04 NOTE — Progress Notes (Signed)
Cardiac Individual Treatment Plan  Patient Details  Name: Mark Shah MRN: [id number] Date of Birth: 10/16/1956 Referring Provider:     CARDIAC REHAB PHASE II ORIENTATION from 06/26/2018 in [facility] CARDIAC REHAB  Referring Provider  Viann Fish MD       Initial Encounter Date:    CARDIAC REHAB PHASE II ORIENTATION from 06/26/2018 in [facility] CARDIAC REHAB  Date  06/26/18      Visit Diagnosis: 05/13/18 Status post coronary artery stent placement DES x 2 LAD, diag  Patient's Home Medications on Admission:  Current Outpatient Medications:  .  Cholecalciferol (VITAMIN D3) 3000 units TABS, Take 1 tablet by mouth daily., Disp: , Rfl:  .  clopidogrel (PLAVIX) 75 MG tablet, Take 1 tablet (75 mg total) by mouth daily., Disp: 30 tablet, Rfl: 0 .  fluticasone (FLONASE) 50 MCG/ACT nasal spray, USE 1 SPRAY IN EACH NOSTRIL DAILY PRN FOR ALLERGIES, Disp: , Rfl: 1 .  Glucagon, rDNA, (GLUCAGON EMERGENCY IJ), Inject 1 mg as directed as needed (as directed for diabetes.Marland Kitchen)., Disp: , Rfl:  .  insulin glargine (LANTUS) 100 UNIT/ML injection, Inject 11 Units into the skin See admin instructions. ONLY IF INSULIN PUMP CAN NOT BE USED , Disp: , Rfl:  .  insulin lispro (HUMALOG) 100 UNIT/ML injection, Inject into the skin as directed. As directed with insulin pump. No unites given., Disp: , Rfl:  .  loratadine (CLARITIN) 10 MG tablet, Take 10 mg by mouth daily as needed for allergies., Disp: , Rfl:  .  metoprolol succinate (TOPROL-XL) 25 MG 24 hr tablet, Take 25 mg by mouth daily., Disp: , Rfl: 1 .  nitroGLYCERIN (NITROSTAT) 0.4 MG SL tablet, Place 1 tablet under the tongue every 5 (five) minutes as needed for chest pain. , Disp: , Rfl: 0 .  rosuvastatin (CRESTOR) 10 MG tablet, Take 10 mg by mouth daily., Disp: , Rfl:  .  sildenafil (VIAGRA) 100 MG tablet, Take 50 mg by mouth daily as needed for erectile dysfunction. , Disp: , Rfl:  .  SYNTHROID 175 MCG tablet,  Take 175 mcg by mouth daily before breakfast. , Disp: , Rfl:  .  telmisartan (MICARDIS) 40 MG tablet, Take 40 mg by mouth daily., Disp: , Rfl:  .  ZETIA 10 MG tablet, Take 1 tablet by mouth daily., Disp: , Rfl:  .  zolpidem (AMBIEN) 10 MG tablet, Take 5 mg by mouth at bedtime as needed for sleep. , Disp: , Rfl: 0  Past Medical History: Past Medical History:  Diagnosis Date  . Adhesive capsulitis    in shoulders  . Diabetes mellitus without complication (HCC)   . Diverticulitis   . Dyslipidemia   . ED (erectile dysfunction)   . HTN (hypertension)   . Hyperlipidemia   . Hypothyroidism   . PONV (postoperative nausea and vomiting)     Tobacco Use: Social History   Tobacco Use  Smoking Status Former Smoker  Smokeless Tobacco Never Used    Labs: Recent Review Flowsheet Data    There is no flowsheet data to display.      Capillary Blood Glucose: Lab Results  Component Value Date   GLUCAP 156 (H) 06/30/2018   GLUCAP 185 (H) 05/13/2018   GLUCAP 239 (H) 05/13/2018   GLUCAP 72 03/07/2016   GLUCAP 59 (L) 03/07/2016     Exercise Target Goals: Exercise Program Goal: Individual exercise prescription set using results from initial 6 min walk test and THRR while considering  patient's  activity barriers and safety.   Exercise Prescription Goal: Initial exercise prescription builds to 30-45 minutes a day of aerobic activity, 2-3 days per week.  Home exercise guidelines will be given to patient during program as part of exercise prescription that the participant will acknowledge.  Activity Barriers & Risk Stratification: Activity Barriers & Cardiac Risk Stratification - 06/26/18 0839      Activity Barriers & Cardiac Risk Stratification   Activity Barriers  Deconditioning;Back Problems    Cardiac Risk Stratification  High       6 Minute Walk: 6 Minute Walk    Row Name 06/26/18 1127         6 Minute Walk   Phase  Initial     Distance  1619 feet     Walk Time  6 minutes      # of Rest Breaks  0     MPH  3.1     METS  3.94     RPE  9     Perceived Dyspnea   0     VO2 Peak  13.79     Symptoms  No     Resting HR  79 bpm     Resting BP  102/62     Resting Oxygen Saturation   99 %     Exercise Oxygen Saturation  during 6 min walk  100 %     Max Ex. HR  89 bpm     Max Ex. BP  118/60     2 Minute Post BP  115/74        Oxygen Initial Assessment:   Oxygen Re-Evaluation:   Oxygen Discharge (Final Oxygen Re-Evaluation):   Initial Exercise Prescription: Initial Exercise Prescription - 06/26/18 1100      Date of Initial Exercise RX and Referring Provider   Date  06/26/18    Referring Provider  Viann Fish MD     Expected Discharge Date  09/18/18      Treadmill   MPH  3.2    Grade  1    Minutes  10    METs  3.89      NuStep   Level  2    SPM  80    Minutes  10    METs  3      Rower   Level  3    Watts  25    Minutes  10    METs  4      Prescription Details   Frequency (times per week)  3x    Duration  Progress to 30 minutes of continuous aerobic without signs/symptoms of physical distress      Intensity   THRR 40-80% of Max Heartrate  63-126    Ratings of Perceived Exertion  11-13    Perceived Dyspnea  0-4      Progression   Progression  Continue progressive overload as per policy without signs/symptoms or physical distress.      Resistance Training   Training Prescription  Yes    Weight  4lbs    Reps  10-15       Perform Capillary Blood Glucose checks as needed.  Exercise Prescription Changes: Exercise Prescription Changes    Row Name 06/30/18 1300 07/23/18 1118 08/06/18 1700 08/18/18 1700 09/01/18 0900     Response to Exercise   Blood Pressure (Admit)  112/70  120/78  123/70  118/68  123/70   Blood Pressure (Exercise)  126/70  128/70  150/78  150/64  140/68   Blood Pressure (Exit)  128/4  102/60  130/70  118/70  110/68   Heart Rate (Admit)  75 bpm  83 bpm  80 bpm  81 bpm  87 bpm   Heart Rate (Exercise)  101  bpm  136 bpm  137 bpm  141 bpm  134 bpm   Heart Rate (Exit)  75 bpm  81 bpm  97 bpm  86 bpm  97 bpm   Rating of Perceived Exertion (Exercise)  13  13  13  12  13    Perceived Dyspnea (Exercise)  0  0  0  0  0   Symptoms  None   None   None  None  None   Comments  Pt oriented to exercise equipment   -  -  None  None   Duration  Progress to 30 minutes of  aerobic without signs/symptoms of physical distress  Continue with 30 min of aerobic exercise without signs/symptoms of physical distress.  Continue with 30 min of aerobic exercise without signs/symptoms of physical distress.  Continue with 30 min of aerobic exercise without signs/symptoms of physical distress.  Progress to 45 minutes of aerobic exercise without signs/symptoms of physical distress   Intensity  THRR New  THRR unchanged  THRR unchanged  THRR unchanged  THRR unchanged     Progression   Progression  Continue to progress workloads to maintain intensity without signs/symptoms of physical distress.  Continue to progress workloads to maintain intensity without signs/symptoms of physical distress.  Continue to progress workloads to maintain intensity without signs/symptoms of physical distress.  Continue to progress workloads to maintain intensity without signs/symptoms of physical distress.  Continue to progress workloads to maintain intensity without signs/symptoms of physical distress.   Average METs  3.59  5.85  5.28  5.6  5.1     Resistance Training   Training Prescription  Yes  No  Yes  Yes  Yes   Weight  4lbs  4lbs  6lbs  6lbs  8lbs   Reps  10-15  10-15  10-15  10-15  10-15   Time  10 Minutes  10 Minutes  10 Minutes  10 Minutes  10 Minutes     Interval Training   Interval Training  No  No  No  No  No     Treadmill   MPH  3.2  3.5  3.5  3.5  3.5   Grade  1  2  2  2  2    Minutes  10  10  10  10  10    METs  3.89  4.65  4.65  4.65  4.65     NuStep   Level  2  6  6  6  7    SPM  80  95  100  110  110   Minutes  10  10  10  10   10    METs  2.3  5.7  4.6  5  5.6     Rower   Level  3  5  5  5  5    Watts  26  88  68  82  83   Minutes  10  10  10  10  10    METs  4.6  7.2  6.6  7  7.2     Home Exercise Plan   Plans to continue exercise at  -  Home (comment) Walking  Home (comment) Walking  Home (comment)  Walking  Home (comment) Walking   Frequency  -  Add 2 additional days to program exercise sessions.  Add 2 additional days to program exercise sessions.  Add 2 additional days to program exercise sessions.  Add 2 additional days to program exercise sessions.   Initial Home Exercises Provided  -  07/11/18  07/11/18  07/11/18  07/11/18      Exercise Comments: Exercise Comments    Row Name 06/30/18 1344 07/21/18 1505 09/01/18 0919       Exercise Comments  Pt's first day of exercise. Pt oriented to exercise equipment. Pt responded well to exercise prescription. Will continue to monitor and progress pt as tolerated.   Reviewed HEP with pt. Pt is continuing to respond well to workloads. Will continue to monitor and progress pt as tolerated.   Spoke with pt today and reviewed METs and Goals. Pt expressed interest in jogging. Will contact physician to get pt cleared to jog in rehab.        Exercise Goals and Review: Exercise Goals    Row Name 06/26/18 0839             Exercise Goals   Increase Physical Activity  Yes       Intervention  Provide advice, education, support and counseling about physical activity/exercise needs.;Develop an individualized exercise prescription for aerobic and resistive training based on initial evaluation findings, risk stratification, comorbidities and participant's personal goals.       Expected Outcomes  Short Term: Attend rehab on a regular basis to increase amount of physical activity.;Long Term: Exercising regularly at least 3-5 days a week.;Long Term: Add in home exercise to make exercise part of routine and to increase amount of physical activity.       Increase Strength and  Stamina  Yes get back to jogging/running       Intervention  Provide advice, education, support and counseling about physical activity/exercise needs.;Develop an individualized exercise prescription for aerobic and resistive training based on initial evaluation findings, risk stratification, comorbidities and participant's personal goals.       Expected Outcomes  Short Term: Increase workloads from initial exercise prescription for resistance, speed, and METs.;Short Term: Perform resistance training exercises routinely during rehab and add in resistance training at home;Long Term: Improve cardiorespiratory fitness, muscular endurance and strength as measured by increased METs and functional capacity ( )       Able to understand and use rate of perceived exertion (RPE) scale  Yes       Intervention  Provide education and explanation on how to use RPE scale       Expected Outcomes  Short Term: Able to use RPE daily in rehab to express subjective intensity level;Long Term:  Able to use RPE to guide intensity level when exercising independently       Knowledge and understanding of Target Heart Rate Range (THRR)  Yes       Intervention  Provide education and explanation of THRR including how the numbers were predicted and where they are located for reference       Expected Outcomes  Short Term: Able to state/look up THRR;Long Term: Able to use THRR to govern intensity when exercising independently;Short Term: Able to use daily as guideline for intensity in rehab       Able to check pulse independently  Yes       Intervention  Provide education and demonstration on how to check pulse in carotid and radial arteries.;Review the importance of being  able to check your own pulse for safety during independent exercise       Expected Outcomes  Short Term: Able to explain why pulse checking is important during independent exercise;Long Term: Able to check pulse independently and accurately       Understanding of  Exercise Prescription  Yes       Intervention  Provide education, explanation, and written materials on patient's individual exercise prescription       Expected Outcomes  Short Term: Able to explain program exercise prescription;Long Term: Able to explain home exercise prescription to exercise independently          Exercise Goals Re-Evaluation : Exercise Goals Re-Evaluation    Row Name 07/21/18 1459 09/01/18 0920           Exercise Goal Re-Evaluation   Exercise Goals Review  Understanding of Exercise Prescription;Knowledge and understanding of Target Heart Rate Range (THRR);Able to understand and use rate of perceived exertion (RPE) scale;Increase Physical Activity;Increase Strength and Stamina;Able to check pulse independently  Increase Physical Activity;Understanding of Exercise Prescription      Comments  Reviewed HEP with pt. Also reviewed THRR, RPE Scale, weather precautions, endpoints of exericse, NTG use, warmup and cool down.   Pt is responding very well to workloads. Pt states he is feeling stronger. Currently working with pt to increase workloads. Pt is now exercising at level 7 on Nustep. Will increase rower level on next exercise session. Pt also wants to get back to jogging. Will contact Dr. today to get clearance.       Expected Outcomes  Pt will continue to walk for exercise daily. Pt will continue to increase cardiovasular fitness. Will continue to monitor and progress pt.   Pt will continue to walk 7 days a wek. Pt will continue to improve his cardiovascular stamina. Will continue to monitor pt's progress.          Discharge Exercise Prescription (Final Exercise Prescription Changes): Exercise Prescription Changes - 09/01/18 0900      Response to Exercise   Blood Pressure (Admit)  123/70    Blood Pressure (Exercise)  140/68    Blood Pressure (Exit)  110/68    Heart Rate (Admit)  87 bpm    Heart Rate (Exercise)  134 bpm    Heart Rate (Exit)  97 bpm    Rating of  Perceived Exertion (Exercise)  13    Perceived Dyspnea (Exercise)  0    Symptoms  None    Comments  None    Duration  Progress to 45 minutes of aerobic exercise without signs/symptoms of physical distress    Intensity  THRR unchanged      Progression   Progression  Continue to progress workloads to maintain intensity without signs/symptoms of physical distress.    Average METs  5.1      Resistance Training   Training Prescription  Yes    Weight  8lbs    Reps  10-15    Time  10 Minutes      Interval Training   Interval Training  No      Treadmill   MPH  3.5    Grade  2    Minutes  10    METs  4.65      NuStep   Level  7    SPM  110    Minutes  10    METs  5.6      Rower   Level  5  Watts  83    Minutes  10    METs  7.2      Home Exercise Plan   Plans to continue exercise at  Home (comment)   Walking   Frequency  Add 2 additional days to program exercise sessions.    Initial Home Exercises Provided  07/11/18       Nutrition:  Target Goals: Understanding of nutrition guidelines, daily intake of sodium 1500mg , cholesterol 200mg , calories 30% from fat and 7% or less from saturated fats, daily to have 5 or more servings of fruits and vegetables.  Biometrics: Pre Biometrics - 06/26/18 1146      Pre Biometrics   Height  5' 10.75" (1.797 m)    Weight  79.8 kg    Waist Circumference  36 inches    Hip Circumference  38.5 inches    Waist to Hip Ratio  0.94 %    BMI (Calculated)  24.71    Triceps Skinfold  26 mm    % Body Fat  26.4 %    Grip Strength  41 kg    Flexibility  9 in    Single Leg Stand  30 seconds        Nutrition Therapy Plan and Nutrition Goals: Nutrition Therapy & Goals - 06/26/18 0902      Nutrition Therapy   Diet  consistent carbohydrate heart healthy      Personal Nutrition Goals   Nutrition Goal  pt able to name foods  that affect blood glucose    Personal Goal #2  pt to identify and limit food sources of sodium, saturated fat,  trans fats, refined carbohydrates    Personal Goal #3  imporved blood glucose control as evidenced by pt's A1c trending from 7.4 toward less than 7.0      Intervention Plan   Intervention  Prescribe, educate and counsel regarding individualized specific dietary modifications aiming towards targeted core components such as weight, hypertension, lipid management, diabetes, heart failure and other comorbidities.    Expected Outcomes  Short Term Goal: Understand basic principles of dietary content, such as calories, fat, sodium, cholesterol and nutrients.       Nutrition Assessments: Nutrition Assessments - 06/26/18 0904      MEDFICTS Scores   Pre Score  9       Nutrition Goals Re-Evaluation:   Nutrition Goals Re-Evaluation:   Nutrition Goals Discharge (Final Nutrition Goals Re-Evaluation):   Psychosocial: Target Goals: Acknowledge presence or absence of significant depression and/or stress, maximize coping skills, provide positive support system. Participant is able to verbalize types and ability to use techniques and skills needed for reducing stress and depression.  Initial Review & Psychosocial Screening: Initial Psych Review & Screening - 06/26/18 1227      Initial Review   Current issues with  None Identified      Family Dynamics   Good Support System?  Yes   Pt reports his wife and friends as sources of support for him.     Barriers   Psychosocial barriers to participate in program  There are no identifiable barriers or psychosocial needs.      Screening Interventions   Interventions  Encouraged to exercise       Quality of Life Scores: Quality of Life - 06/26/18 1121      Quality of Life   Select  Quality of Life      Quality of Life Scores   Health/Function Pre  20.9 %    Socioeconomic  Pre  24.36 %    Psych/Spiritual Pre  20.25 %    Family Pre  26.4 %    GLOBAL Pre  22.35 %      Scores of 19 and below usually indicate a poorer quality of life in these  areas.  A difference of  2-3 points is a clinically meaningful difference.  A difference of 2-3 points in the total score of the Quality of Life Index has been associated with significant improvement in overall quality of life, self-image, physical symptoms, and general health in studies assessing change in quality of life.  PHQ-9: Recent Review Flowsheet Data    Depression screen [facility] 2/9 06/30/2018 11/18/2015   Decreased Interest 0 0   Down, Depressed, Hopeless 0 0   PHQ - 2 Score 0 0     Interpretation of Total Score  Total Score Depression Severity:  1-4 = Minimal depression, 5-9 = Mild depression, 10-14 = Moderate depression, 15-19 = Moderately severe depression, 20-27 = Severe depression   Psychosocial Evaluation and Intervention: Psychosocial Evaluation - 06/30/18 1440      Psychosocial Evaluation & Interventions   Interventions  Encouraged to exercise with the program and follow exercise prescription    Comments  No psychosocial needs identified. "Mark Shah" enjoys traveling, going to [employer], fishing, and reading.    Expected Outcomes  "Mark Shah" will continue to exhibit a positive outlook with good coping skills.    Continue Psychosocial Services   No Follow up required       Psychosocial Re-Evaluation: Psychosocial Re-Evaluation    Row Name 07/04/18 1622 08/06/18 1742 08/29/18 1405         Psychosocial Re-Evaluation   Current issues with  None Identified  None Identified  None Identified     Comments  No interventions necessary.  No interventions necessary.  No interventions necessary.     Expected Outcomes  Mark Shah will maintian a positive outlook.   Mark Shah will maintian a positive outlook.   Mark Shah will maintian a positive outlook.      Interventions  Encouraged to attend Cardiac Rehabilitation for the exercise  Encouraged to attend Cardiac Rehabilitation for the exercise  Encouraged to attend Cardiac Rehabilitation for the exercise     Continue Psychosocial Services   No Follow up  required  No Follow up required  No Follow up required        Psychosocial Discharge (Final Psychosocial Re-Evaluation): Psychosocial Re-Evaluation - 08/29/18 1405      Psychosocial Re-Evaluation   Current issues with  None Identified    Comments  No interventions necessary.    Expected Outcomes  Mark Shah will maintian a positive outlook.     Interventions  Encouraged to attend Cardiac Rehabilitation for the exercise    Continue Psychosocial Services   No Follow up required       Vocational Rehabilitation: Provide vocational rehab assistance to qualifying candidates.   Vocational Rehab Evaluation & Intervention:   Education: Education Goals: Education classes will be provided on a weekly basis, covering required topics. Participant will state understanding/return demonstration of topics presented.  Learning Barriers/Preferences: Learning Barriers/Preferences - 06/26/18 0840      Learning Barriers/Preferences   Learning Barriers  Sight    Learning Preferences  Written Material;Video;Pictoral;Computer/Internet       Education Topics: Count Your Pulse:  -Group instruction provided by verbal instruction, demonstration, patient participation and written materials to support subject.  Instructors address importance of being able to find your pulse and how to count  your pulse when at home without a heart monitor.  Patients get hands on experience counting their pulse with staff help and individually.   Heart Attack, Angina, and Risk Factor Modification:  -Group instruction provided by verbal instruction, video, and written materials to support subject.  Instructors address signs and symptoms of angina and heart attacks.    Also discuss risk factors for heart disease and how to make changes to improve heart health risk factors.   Functional Fitness:  -Group instruction provided by verbal instruction, demonstration, patient participation, and written materials to support subject.   Instructors address safety measures for doing things around the house.  Discuss how to get up and down off the floor, how to pick things up properly, how to safely get out of a chair without assistance, and balance training.   Meditation and Mindfulness:  -Group instruction provided by verbal instruction, patient participation, and written materials to support subject.  Instructor addresses importance of mindfulness and meditation practice to help reduce stress and improve awareness.  Instructor also leads participants through a meditation exercise.    Stretching for Flexibility and Mobility:  -Group instruction provided by verbal instruction, patient participation, and written materials to support subject.  Instructors lead participants through series of stretches that are designed to increase flexibility thus improving mobility.  These stretches are additional exercise for major muscle groups that are typically performed during regular warm up and cool down.   Hands Only CPR:  -Group verbal, video, and participation provides a basic overview of AHA guidelines for community CPR. Role-play of emergencies allow participants the opportunity to practice calling for help and chest compression technique with discussion of AED use.   Hypertension: -Group verbal and written instruction that provides a basic overview of hypertension including the most recent diagnostic guidelines, risk factor reduction with self-care instructions and medication management.    Nutrition I class: Heart Healthy Eating:  -Group instruction provided by PowerPoint slides, verbal discussion, and written materials to support subject matter. The instructor gives an explanation and review of the Therapeutic Lifestyle Changes diet recommendations, which includes a discussion on lipid goals, dietary fat, sodium, fiber, plant stanol/sterol esters, sugar, and the components of a well-balanced, healthy diet.   Nutrition II class:  Lifestyle Skills:  -Group instruction provided by PowerPoint slides, verbal discussion, and written materials to support subject matter. The instructor gives an explanation and review of label reading, grocery shopping for heart health, heart healthy recipe modifications, and ways to make healthier choices when eating out.   Diabetes Question & Answer:  -Group instruction provided by PowerPoint slides, verbal discussion, and written materials to support subject matter. The instructor gives an explanation and review of diabetes co-morbidities, pre- and post-prandial blood glucose goals, pre-exercise blood glucose goals, signs, symptoms, and treatment of hypoglycemia and hyperglycemia, and foot care basics.   Diabetes Blitz:  -Group instruction provided by PowerPoint slides, verbal discussion, and written materials to support subject matter. The instructor gives an explanation and review of the physiology behind type 1 and type 2 diabetes, diabetes medications and rational behind using different medications, pre- and post-prandial blood glucose recommendations and Hemoglobin A1c goals, diabetes diet, and exercise including blood glucose guidelines for exercising safely.    Portion Distortion:  -Group instruction provided by PowerPoint slides, verbal discussion, written materials, and food models to support subject matter. The instructor gives an explanation of serving size versus portion size, changes in portions sizes over the last 20 years, and what consists of a  serving from each food group.   Stress Management:  -Group instruction provided by verbal instruction, video, and written materials to support subject matter.  Instructors review role of stress in heart disease and how to cope with stress positively.     CARDIAC REHAB PHASE II EXERCISE from 07/23/2018 in [facility] CARDIAC REHAB  Date  07/23/18  Educator  RN  Instruction Review Code  2- Demonstrated Understanding       Exercising on Your Own:  -Group instruction provided by verbal instruction, power point, and written materials to support subject.  Instructors discuss benefits of exercise, components of exercise, frequency and intensity of exercise, and end points for exercise.  Also discuss use of nitroglycerin and activating EMS.  Review options of places to exercise outside of rehab.  Review guidelines for sex with heart disease.   Cardiac Drugs I:  -Group instruction provided by verbal instruction and written materials to support subject.  Instructor reviews cardiac drug classes: antiplatelets, anticoagulants, beta blockers, and statins.  Instructor discusses reasons, side effects, and lifestyle considerations for each drug class.   CARDIAC REHAB PHASE II EXERCISE from 07/23/2018 in [facility] CARDIAC REHAB  Date  07/16/18  Instruction Review Code  2- Demonstrated Understanding      Cardiac Drugs II:  -Group instruction provided by verbal instruction and written materials to support subject.  Instructor reviews cardiac drug classes: angiotensin converting enzyme inhibitors (ACE-I), angiotensin II receptor blockers (ARBs), nitrates, and calcium channel blockers.  Instructor discusses reasons, side effects, and lifestyle considerations for each drug class.   Anatomy and Physiology of the Circulatory System:  Group verbal and written instruction and models provide basic cardiac anatomy and physiology, with the coronary electrical and arterial systems. Review of: AMI, Angina, Valve disease, Heart Failure, Peripheral Artery Disease, Cardiac Arrhythmia, Pacemakers, and the ICD.   Other Education:  -Group or individual verbal, written, or video instructions that support the educational goals of the cardiac rehab program.   Holiday Eating Survival Tips:  -Group instruction provided by PowerPoint slides, verbal discussion, and written materials to support subject matter. The instructor  gives patients tips, tricks, and techniques to help them not only survive but enjoy the holidays despite the onslaught of food that accompanies the holidays.   Knowledge Questionnaire Score: Knowledge Questionnaire Score - 06/26/18 1121      Knowledge Questionnaire Score   Pre Score  21/24       Core Components/Risk Factors/Patient Goals at Admission: Personal Goals and Risk Factors at Admission - 06/26/18 1148      Core Components/Risk Factors/Patient Goals on Admission    Weight Management  Yes;Weight Loss    Intervention  Weight Management: Develop a combined nutrition and exercise program designed to reach desired caloric intake, while maintaining appropriate intake of nutrient and fiber, sodium and fats, and appropriate energy expenditure required for the weight goal.;Weight Management: Provide education and appropriate resources to help participant work on and attain dietary goals.;Weight Management/Obesity: Establish reasonable short term and long term weight goals.    Admit Weight  175 lb 14.8 oz (79.8 kg)    Goal Weight: Long Term  165 lb (74.8 kg)    Expected Outcomes  Short Term: Continue to assess and modify interventions until short term weight is achieved;Long Term: Adherence to nutrition and physical activity/exercise program aimed toward attainment of established weight goal;Weight Loss: Understanding of general recommendations for a balanced deficit meal plan, which promotes 1-2 lb weight loss per week  and includes a negative energy balance of [phone] kcal/d;Understanding recommendations for meals to include 15-35% energy as protein, 25-35% energy from fat, 35-60% energy from carbohydrates, less than 200mg  of dietary cholesterol, 20-35 gm of total fiber daily;Understanding of distribution of calorie intake throughout the day with the consumption of 4-5 meals/snacks       Core Components/Risk Factors/Patient Goals Review:  Goals and Risk Factor Review    Row Name 06/30/18  1442 07/04/18 1622 08/06/18 1742 08/29/18 1405       Core Components/Risk Factors/Patient Goals Review   Personal Goals Review  Weight Management/Obesity;Lipids;Hypertension;Diabetes  Weight Management/Obesity;Lipids;Hypertension;Diabetes  Weight Management/Obesity;Lipids;Hypertension;Diabetes  Weight Management/Obesity;Lipids;Hypertension;Diabetes    Review  Pt with multiple CAD RFs willing to participate in CR exercise. "Mark Shah" would like to increase his strength and stamina.  Pt with multiple CAD RFs willing to participate in CR exercise. "Mark Shah" would like to increase his strength and stamina.  Pt with multiple CAD RFs willing to participate in CR exercise.  Mark Shah is doing well with exercise.  He feels that he is increasing his strength.   Pt with multiple CAD RFs willing to participate in CR exercise.  Mark Shah continues to enjoy CR.  He likes the structure and routine that CR provides.     Expected Outcomes  Pt will continue to participate in CR exercise, nutrition, and lifestyle modification opportunities.   Pt will continue to participate in CR exercise, nutrition, and lifestyle modification opportunities.   Pt will continue to participate in CR exercise, nutrition, and lifestyle modification opportunities.   Pt will continue to participate in CR exercise, nutrition, and lifestyle modification opportunities.        Core Components/Risk Factors/Patient Goals at Discharge (Final Review):  Goals and Risk Factor Review - 08/29/18 1405      Core Components/Risk Factors/Patient Goals Review   Personal Goals Review  Weight Management/Obesity;Lipids;Hypertension;Diabetes    Review  Pt with multiple CAD RFs willing to participate in CR exercise.  Mark Shah continues to enjoy CR.  He likes the structure and routine that CR provides.     Expected Outcomes  Pt will continue to participate in CR exercise, nutrition, and lifestyle modification opportunities.        ITP Comments: ITP Comments    Row Name 06/26/18 0837  06/30/18 1103 07/04/18 1621 08/06/18 1741 08/29/18 1405   ITP Comments  Dr. Armanda Magic, Medical Director  Pt started exercise today and tolerated it well.   30 Day ITP Review.  Mark Shah is off to a great start and is tolerating exercise well.   30 Day ITP Review.  Mark Shah is doing well.  He feels that he is increasing his strength. VSS.  30 Day ITP Review. Mark Shah continues to tolerate exercise well.  He enjoys the structure and routine provided by CR.      Comments: See ITP Comments.

## 2018-09-05 ENCOUNTER — Encounter (HOSPITAL_COMMUNITY): Payer: BLUE CROSS/BLUE SHIELD

## 2018-09-05 ENCOUNTER — Encounter (HOSPITAL_COMMUNITY)
Admission: RE | Admit: 2018-09-05 | Discharge: 2018-09-05 | Disposition: A | Discharge status: Home / Self Care | Payer: BLUE CROSS/BLUE SHIELD | Source: Ambulatory Visit | Attending: Cardiology | Admitting: Cardiology

## 2018-09-05 DIAGNOSIS — Z955 Presence of coronary angioplasty implant and graft: Secondary | ICD-10-CM | POA: Diagnosis not present

## 2018-09-08 ENCOUNTER — Encounter (HOSPITAL_COMMUNITY): Payer: BLUE CROSS/BLUE SHIELD

## 2018-09-08 ENCOUNTER — Encounter (HOSPITAL_COMMUNITY)
Admission: RE | Admit: 2018-09-08 | Discharge: 2018-09-08 | Disposition: A | Discharge status: Home / Self Care | Payer: BLUE CROSS/BLUE SHIELD | Source: Ambulatory Visit | Attending: Cardiology | Admitting: Cardiology

## 2018-09-08 DIAGNOSIS — Z955 Presence of coronary angioplasty implant and graft: Secondary | ICD-10-CM | POA: Diagnosis not present

## 2018-09-09 DIAGNOSIS — E039 Hypothyroidism, unspecified: Secondary | ICD-10-CM | POA: Diagnosis not present

## 2018-09-09 DIAGNOSIS — Z794 Long term (current) use of insulin: Secondary | ICD-10-CM | POA: Diagnosis not present

## 2018-09-09 DIAGNOSIS — Z23 Encounter for immunization: Secondary | ICD-10-CM | POA: Diagnosis not present

## 2018-09-09 DIAGNOSIS — E109 Type 1 diabetes mellitus without complications: Secondary | ICD-10-CM | POA: Diagnosis not present

## 2018-09-10 ENCOUNTER — Encounter (HOSPITAL_COMMUNITY): Payer: BLUE CROSS/BLUE SHIELD

## 2018-09-12 ENCOUNTER — Encounter (HOSPITAL_COMMUNITY): Payer: BLUE CROSS/BLUE SHIELD

## 2018-09-12 ENCOUNTER — Telehealth (HOSPITAL_COMMUNITY): Payer: Self-pay | Admitting: Family Medicine

## 2018-09-15 ENCOUNTER — Encounter (HOSPITAL_COMMUNITY): Payer: BLUE CROSS/BLUE SHIELD

## 2018-09-15 ENCOUNTER — Encounter (HOSPITAL_COMMUNITY)
Admission: RE | Admit: 2018-09-15 | Discharge: 2018-09-15 | Disposition: A | Discharge status: Home / Self Care | Payer: BLUE CROSS/BLUE SHIELD | Source: Ambulatory Visit | Attending: Cardiology | Admitting: Cardiology

## 2018-09-15 DIAGNOSIS — E109 Type 1 diabetes mellitus without complications: Secondary | ICD-10-CM | POA: Diagnosis not present

## 2018-09-15 DIAGNOSIS — Z955 Presence of coronary angioplasty implant and graft: Secondary | ICD-10-CM | POA: Diagnosis not present

## 2018-09-17 ENCOUNTER — Encounter (HOSPITAL_COMMUNITY): Payer: BLUE CROSS/BLUE SHIELD

## 2018-09-17 ENCOUNTER — Encounter (HOSPITAL_COMMUNITY)
Admission: RE | Admit: 2018-09-17 | Discharge: 2018-09-17 | Disposition: A | Discharge status: Home / Self Care | Payer: BLUE CROSS/BLUE SHIELD | Source: Ambulatory Visit | Attending: Cardiology | Admitting: Cardiology

## 2018-09-17 DIAGNOSIS — Z955 Presence of coronary angioplasty implant and graft: Secondary | ICD-10-CM | POA: Diagnosis not present

## 2018-09-19 ENCOUNTER — Encounter (HOSPITAL_COMMUNITY): Payer: BLUE CROSS/BLUE SHIELD

## 2018-09-21 ENCOUNTER — Ambulatory Visit: Payer: Self-pay | Admitting: Family Medicine

## 2018-09-21 VITALS — BP 124/78 | HR 103 | Temp 99.6°F | Resp 18 | Wt 176.4 lb

## 2018-09-21 DIAGNOSIS — R059 Cough, unspecified: Secondary | ICD-10-CM

## 2018-09-21 DIAGNOSIS — J329 Chronic sinusitis, unspecified: Secondary | ICD-10-CM

## 2018-09-21 DIAGNOSIS — R05 Cough: Secondary | ICD-10-CM

## 2018-09-21 MED ORDER — AMOXICILLIN 875 MG PO TABS
875.0000 mg | ORAL_TABLET | Freq: Two times a day (BID) | ORAL | 0 refills | Status: DC
Start: 2018-09-21 — End: 2018-10-09

## 2018-09-21 MED ORDER — AZELASTINE HCL 0.1 % NA SOLN: 1.0000 | Freq: Two times a day (BID) | NASAL | 0 refills | Status: DC

## 2018-09-21 MED ORDER — BENZONATATE 200 MG PO CAPS: 200.0000 mg | ORAL_CAPSULE | Freq: Two times a day (BID) | ORAL | 0 refills | Status: DC | PRN

## 2018-09-21 NOTE — Progress Notes (Signed)
Mark Shah is a 62 y.o. male who presents today with concerns of cough and cold medications for the last 3 weeks. He has attempted multiple over the counter medications to relieve his symptoms without relief. He continues to have a general feeling of unwell, facial pain, cough and low grade fever. Of note he is in cardiac rehab after stent placement this July 2019, has type 1 DM, and is under care of endocrine for thyroid monitoring. He denies any known sick contact but reports that cold symptoms began Halloween and have persisted in a progressive intermittent way since then.  Review of Systems  Constitutional: Positive for fever and malaise/fatigue. Negative for chills.  HENT: Positive for congestion and sinus pain. Negative for ear discharge, ear pain and sore throat.   Eyes: Negative.   Respiratory: Positive for cough. Negative for sputum production and shortness of breath.   Cardiovascular: Negative.  Negative for chest pain.  Gastrointestinal: Negative for abdominal pain, diarrhea, nausea and vomiting.  Genitourinary: Negative for dysuria, frequency, hematuria and urgency.  Musculoskeletal: Negative for myalgias.  Skin: Negative.   Neurological: Negative for headaches.  Endo/Heme/Allergies: Negative.   Psychiatric/Behavioral: Negative.     O: Vitals:   09/21/18 1500  BP: 124/78  Pulse: (!) 103  Resp: 18  Temp: 99.6 F (37.6 C)  SpO2: 98%     Physical Exam  Constitutional: He is oriented to person, place, and time. Vital signs are normal. He appears well-developed and well-nourished. He is active.  Non-toxic appearance. He does not have a sickly appearance.  HENT:  Head: Normocephalic.  Right Ear: Hearing, tympanic membrane, external ear and ear canal normal.  Left Ear: Hearing, tympanic membrane, external ear and ear canal normal.  Nose: Rhinorrhea present. Right sinus exhibits maxillary sinus tenderness. Right sinus exhibits no frontal sinus tenderness. Left sinus  exhibits maxillary sinus tenderness. Left sinus exhibits no frontal sinus tenderness.  Mouth/Throat: Uvula is midline. Posterior oropharyngeal erythema present.  Neck: Normal range of motion. Neck supple.  Cardiovascular: Normal rate, regular rhythm, normal heart sounds and normal pulses.  Pulmonary/Chest: Effort normal and breath sounds normal.  Abdominal: Soft. Bowel sounds are normal.  Musculoskeletal: Normal range of motion.  Lymphadenopathy:       Head (right side): Tonsillar adenopathy present. No submental and no submandibular adenopathy present.       Head (left side): Tonsillar adenopathy present. No submental and no submandibular adenopathy present.    He has no cervical adenopathy.  Neurological: He is alert and oriented to person, place, and time.  Psychiatric: He has a normal mood and affect.  Vitals reviewed.  A: 1. Sinusitis, unspecified chronicity, unspecified location   2. Cough    P: Discussed exam findings, diagnosis etiology and medication use and indications reviewed with patient. Follow- Up and discharge instructions provided. No emergent/urgent issues found on exam.  Patient verbalized understanding of information provided and agrees with plan of care (POC), all questions answered.  1. Sinusitis, unspecified chronicity, unspecified location - amoxicillin (AMOXIL) 875 MG tablet; Take 1 tablet (875 mg total) by mouth 2 (two) times daily. - azelastine (ASTELIN) 0.1 % nasal spray; Place 1 spray into both nostrils 2 (two) times daily. Use in each nostril as directed  2. Cough - benzonatate (TESSALON) 200 MG capsule; Take 1 capsule (200 mg total) by mouth 2 (two) times daily as needed for cough.

## 2018-09-21 NOTE — Patient Instructions (Signed)

## 2018-09-22 ENCOUNTER — Encounter (HOSPITAL_COMMUNITY): Payer: BLUE CROSS/BLUE SHIELD

## 2018-09-24 ENCOUNTER — Encounter (HOSPITAL_COMMUNITY): Payer: BLUE CROSS/BLUE SHIELD

## 2018-09-24 ENCOUNTER — Encounter (HOSPITAL_COMMUNITY): Payer: Self-pay

## 2018-09-25 NOTE — Progress Notes (Signed)
Cardiac Individual Treatment Plan  Patient Details  Name: Mark Shah MRN: [id number] Date of Birth: [birth year] Referring Provider:     CARDIAC REHAB PHASE II ORIENTATION from 06/26/2018 in [facility] CARDIAC REHAB  Referring Provider  Viann Fish MD       Initial Encounter Date:    CARDIAC REHAB PHASE II ORIENTATION from 06/26/2018 in [facility] CARDIAC REHAB  Date  06/26/18      Visit Diagnosis: 05/13/18 Status post coronary artery stent placement DES x 2 LAD, diag  Patient's Home Medications on Admission:  Current Outpatient Medications:  .  amoxicillin (AMOXIL) 875 MG tablet, Take 1 tablet (875 mg total) by mouth 2 (two) times daily., Disp: 20 tablet, Rfl: 0 .  azelastine (ASTELIN) 0.1 % nasal spray, Place 1 spray into both nostrils 2 (two) times daily. Use in each nostril as directed, Disp: 30 mL, Rfl: 0 .  benzonatate (TESSALON) 200 MG capsule, Take 1 capsule (200 mg total) by mouth 2 (two) times daily as needed for cough., Disp: 20 capsule, Rfl: 0 .  Cholecalciferol (VITAMIN D3) 3000 units TABS, Take 1 tablet by mouth daily., Disp: , Rfl:  .  clopidogrel (PLAVIX) 75 MG tablet, Take 1 tablet (75 mg total) by mouth daily., Disp: 30 tablet, Rfl: 0 .  fluticasone (FLONASE) 50 MCG/ACT nasal spray, USE 1 SPRAY IN EACH NOSTRIL DAILY PRN FOR ALLERGIES, Disp: , Rfl: 1 .  Glucagon, rDNA, (GLUCAGON EMERGENCY IJ), Inject 1 mg as directed as needed (as directed for diabetes.Marland Kitchen)., Disp: , Rfl:  .  insulin glargine (LANTUS) 100 UNIT/ML injection, Inject 11 Units into the skin See admin instructions. ONLY IF INSULIN PUMP CAN NOT BE USED , Disp: , Rfl:  .  insulin lispro (HUMALOG) 100 UNIT/ML injection, Inject into the skin as directed. As directed with insulin pump. No unites given., Disp: , Rfl:  .  loratadine (CLARITIN) 10 MG tablet, Take 10 mg by mouth daily as needed for allergies., Disp: , Rfl:  .  metoprolol succinate (TOPROL-XL) 25 MG 24 hr  tablet, Take 25 mg by mouth daily., Disp: , Rfl: 1 .  nitroGLYCERIN (NITROSTAT) 0.4 MG SL tablet, Place 1 tablet under the tongue every 5 (five) minutes as needed for chest pain. , Disp: , Rfl: 0 .  rosuvastatin (CRESTOR) 10 MG tablet, Take 10 mg by mouth daily., Disp: , Rfl:  .  sildenafil (VIAGRA) 100 MG tablet, Take 50 mg by mouth daily as needed for erectile dysfunction. , Disp: , Rfl:  .  SYNTHROID 175 MCG tablet, Take 175 mcg by mouth daily before breakfast. , Disp: , Rfl:  .  telmisartan (MICARDIS) 40 MG tablet, Take 40 mg by mouth daily., Disp: , Rfl:  .  ZETIA 10 MG tablet, Take 1 tablet by mouth daily., Disp: , Rfl:  .  zolpidem (AMBIEN) 10 MG tablet, Take 5 mg by mouth at bedtime as needed for sleep. , Disp: , Rfl: 0  Past Medical History: Past Medical History:  Diagnosis Date  . Adhesive capsulitis    in shoulders  . Diabetes mellitus without complication (HCC)   . Diverticulitis   . Dyslipidemia   . ED (erectile dysfunction)   . HTN (hypertension)   . Hyperlipidemia   . Hypothyroidism   . PONV (postoperative nausea and vomiting)     Tobacco Use: Social History   Tobacco Use  Smoking Status Former Smoker  Smokeless Tobacco Never Used    Labs: Recent Review Advice worker  There is no flowsheet data to display.      Capillary Blood Glucose: Lab Results  Component Value Date   GLUCAP 156 (H) 06/30/2018   GLUCAP 185 (H) 05/13/2018   GLUCAP 239 (H) 05/13/2018   GLUCAP 72 03/07/2016   GLUCAP 59 (L) 03/07/2016     Exercise Target Goals: Exercise Program Goal: Individual exercise prescription set using results from initial 6 min walk test and THRR while considering  patient's activity barriers and safety.   Exercise Prescription Goal: Initial exercise prescription builds to 30-45 minutes a day of aerobic activity, 2-3 days per week.  Home exercise guidelines will be given to patient during program as part of exercise prescription that the participant will  acknowledge.  Activity Barriers & Risk Stratification: Activity Barriers & Cardiac Risk Stratification - 06/26/18 0839      Activity Barriers & Cardiac Risk Stratification   Activity Barriers  Deconditioning;Back Problems    Cardiac Risk Stratification  High       6 Minute Walk: 6 Minute Walk    Row Name 06/26/18 1127         6 Minute Walk   Phase  Initial     Distance  1619 feet     Walk Time  6 minutes     # of Rest Breaks  0     MPH  3.1     METS  3.94     RPE  9     Perceived Dyspnea   0     VO2 Peak  13.79     Symptoms  No     Resting HR  79 bpm     Resting BP  102/62     Resting Oxygen Saturation   99 %     Exercise Oxygen Saturation  during 6 min walk  100 %     Max Ex. HR  89 bpm     Max Ex. BP  118/60     2 Minute Post BP  115/74        Oxygen Initial Assessment:   Oxygen Re-Evaluation:   Oxygen Discharge (Final Oxygen Re-Evaluation):   Initial Exercise Prescription: Initial Exercise Prescription - 06/26/18 1100      Date of Initial Exercise RX and Referring Provider   Date  06/26/18    Referring Provider  Viann Fish MD     Expected Discharge Date  09/18/18      Treadmill   MPH  3.2    Grade  1    Minutes  10    METs  3.89      NuStep   Level  2    SPM  80    Minutes  10    METs  3      Rower   Level  3    Watts  25    Minutes  10    METs  4      Prescription Details   Frequency (times per week)  3x    Duration  Progress to 30 minutes of continuous aerobic without signs/symptoms of physical distress      Intensity   THRR 40-80% of Max Heartrate  63-126    Ratings of Perceived Exertion  11-13    Perceived Dyspnea  0-4      Progression   Progression  Continue progressive overload as per policy without signs/symptoms or physical distress.      Resistance Training   Training Prescription  Yes  Weight  4lbs    Reps  10-15       Perform Capillary Blood Glucose checks as needed.  Exercise Prescription  Changes: Exercise Prescription Changes    Row Name 06/30/18 1300 07/23/18 1118 08/06/18 1700 08/18/18 1700 09/01/18 0900     Response to Exercise   Blood Pressure (Admit)  112/70  120/78  123/70  118/68  123/70   Blood Pressure (Exercise)  126/70  128/70  150/78  150/64  140/68   Blood Pressure (Exit)  128/4  102/60  130/70  118/70  110/68   Heart Rate (Admit)  75 bpm  83 bpm  80 bpm  81 bpm  87 bpm   Heart Rate (Exercise)  101 bpm  136 bpm  137 bpm  141 bpm  134 bpm   Heart Rate (Exit)  75 bpm  81 bpm  97 bpm  86 bpm  97 bpm   Rating of Perceived Exertion (Exercise)  13  13  13  12  13    Perceived Dyspnea (Exercise)  0  0  0  0  0   Symptoms  None   None   None  None  None   Comments  Pt oriented to exercise equipment   -  -  None  None   Duration  Progress to 30 minutes of  aerobic without signs/symptoms of physical distress  Continue with 30 min of aerobic exercise without signs/symptoms of physical distress.  Continue with 30 min of aerobic exercise without signs/symptoms of physical distress.  Continue with 30 min of aerobic exercise without signs/symptoms of physical distress.  Progress to 45 minutes of aerobic exercise without signs/symptoms of physical distress   Intensity  THRR New  THRR unchanged  THRR unchanged  THRR unchanged  THRR unchanged     Progression   Progression  Continue to progress workloads to maintain intensity without signs/symptoms of physical distress.  Continue to progress workloads to maintain intensity without signs/symptoms of physical distress.  Continue to progress workloads to maintain intensity without signs/symptoms of physical distress.  Continue to progress workloads to maintain intensity without signs/symptoms of physical distress.  Continue to progress workloads to maintain intensity without signs/symptoms of physical distress.   Average METs  3.59  5.85  5.28  5.6  5.1     Resistance Training   Training Prescription  Yes  No  Yes  Yes  Yes   Weight   4lbs  4lbs  6lbs  6lbs  8lbs   Reps  10-15  10-15  10-15  10-15  10-15   Time  10 Minutes  10 Minutes  10 Minutes  10 Minutes  10 Minutes     Interval Training   Interval Training  No  No  No  No  No     Treadmill   MPH  3.2  3.5  3.5  3.5  3.5   Grade  1  2  2  2  2    Minutes  10  10  10  10  10    METs  3.89  4.65  4.65  4.65  4.65     NuStep   Level  2  6  6  6  7    SPM  80  95  100  110  110   Minutes  10  10  10  10  10    METs  2.3  5.7  4.6  5  5.6     Rower  Level  3  5  5  5  5    Watts  26  88  68  82  83   Minutes  10  10  10  10  10    METs  4.6  7.2  6.6  7  7.2     Home Exercise Plan   Plans to continue exercise at  -  Home (comment) Walking  Home (comment) Walking  Home (comment) Walking  Home (comment) Walking   Frequency  -  Add 2 additional days to program exercise sessions.  Add 2 additional days to program exercise sessions.  Add 2 additional days to program exercise sessions.  Add 2 additional days to program exercise sessions.   Initial Home Exercises Provided  -  07/11/18  07/11/18  07/11/18  07/11/18   Row Name 09/15/18 1300 09/17/18 1425           Response to Exercise   Blood Pressure (Admit)  130/74  124/76      Blood Pressure (Exercise)  170/62  164/70      Blood Pressure (Exit)  114/62  132/68      Heart Rate (Admit)  84 bpm  83 bpm      Heart Rate (Exercise)  144 bpm  143 bpm      Heart Rate (Exit)  95 bpm  126 bpm      Rating of Perceived Exertion (Exercise)  13  13      Perceived Dyspnea (Exercise)  0  0      Symptoms  Pt's HR exceeded Target, Had to slow down pt  Pt's HR exceeded Target, Had to slow down pt      Comments  None  None      Duration  Progress to 45 minutes of aerobic exercise without signs/symptoms of physical distress  Progress to 45 minutes of aerobic exercise without signs/symptoms of physical distress      Intensity  THRR unchanged  THRR unchanged        Progression   Progression  Continue to progress workloads to  maintain intensity without signs/symptoms of physical distress.  Continue to progress workloads to maintain intensity without signs/symptoms of physical distress.      Average METs  5.78  5.88        Resistance Training   Training Prescription  Yes  Yes      Weight  8lbs  8lbs      Reps  10-15  10-15      Time  10 Minutes  10 Minutes        Interval Training   Interval Training  No  No        Treadmill   MPH  3.3  3.3      Grade  4  4      Minutes  10  10      METs  5.35  5.35        NuStep   Level  7  7      SPM  110  110      Minutes  10  10      METs  5.2  5.5        Rower   Level  5  5      Watts  75  82      Minutes  10  10      METs  6.8  6.8        Home Exercise  Plan   Plans to continue exercise at  Home (comment) Walking  Home (comment) Walking      Frequency  Add 2 additional days to program exercise sessions.  Add 2 additional days to program exercise sessions.      Initial Home Exercises Provided  07/11/18  07/11/18         Exercise Comments: Exercise Comments    Row Name 06/30/18 1344 07/21/18 1505 09/01/18 0919 09/22/18 1429     Exercise Comments  Pt's first day of exercise. Pt oriented to exercise equipment. Pt responded well to exercise prescription. Will continue to monitor and progress pt as tolerated.   Reviewed HEP with pt. Pt is continuing to respond well to workloads. Will continue to monitor and progress pt as tolerated.   Spoke with pt today and reviewed METs and Goals. Pt expressed interest in jogging. Will contact physician to get pt cleared to jog in rehab.  Reviewed METs and Goals with pt. Pt continuing to progress and tolerate workload increases. Will continue to monitor.        Exercise Goals and Review: Exercise Goals    Row Name 06/26/18 0839             Exercise Goals   Increase Physical Activity  Yes       Intervention  Provide advice, education, support and counseling about physical activity/exercise needs.;Develop an  individualized exercise prescription for aerobic and resistive training based on initial evaluation findings, risk stratification, comorbidities and participant's personal goals.       Expected Outcomes  Short Term: Attend rehab on a regular basis to increase amount of physical activity.;Long Term: Exercising regularly at least 3-5 days a week.;Long Term: Add in home exercise to make exercise part of routine and to increase amount of physical activity.       Increase Strength and Stamina  Yes get back to jogging/running       Intervention  Provide advice, education, support and counseling about physical activity/exercise needs.;Develop an individualized exercise prescription for aerobic and resistive training based on initial evaluation findings, risk stratification, comorbidities and participant's personal goals.       Expected Outcomes  Short Term: Increase workloads from initial exercise prescription for resistance, speed, and METs.;Short Term: Perform resistance training exercises routinely during rehab and add in resistance training at home;Long Term: Improve cardiorespiratory fitness, muscular endurance and strength as measured by increased METs and functional capacity ( )       Able to understand and use rate of perceived exertion (RPE) scale  Yes       Intervention  Provide education and explanation on how to use RPE scale       Expected Outcomes  Short Term: Able to use RPE daily in rehab to express subjective intensity level;Long Term:  Able to use RPE to guide intensity level when exercising independently       Knowledge and understanding of Target Heart Rate Range (THRR)  Yes       Intervention  Provide education and explanation of THRR including how the numbers were predicted and where they are located for reference       Expected Outcomes  Short Term: Able to state/look up THRR;Long Term: Able to use THRR to govern intensity when exercising independently;Short Term: Able to use daily as  guideline for intensity in rehab       Able to check pulse independently  Yes       Intervention  Provide education and demonstration  on how to check pulse in carotid and radial arteries.;Review the importance of being able to check your own pulse for safety during independent exercise       Expected Outcomes  Short Term: Able to explain why pulse checking is important during independent exercise;Long Term: Able to check pulse independently and accurately       Understanding of Exercise Prescription  Yes       Intervention  Provide education, explanation, and written materials on patient's individual exercise prescription       Expected Outcomes  Short Term: Able to explain program exercise prescription;Long Term: Able to explain home exercise prescription to exercise independently          Exercise Goals Re-Evaluation : Exercise Goals Re-Evaluation    Row Name 07/21/18 1459 09/01/18 0920 09/22/18 1426         Exercise Goal Re-Evaluation   Exercise Goals Review  Understanding of Exercise Prescription;Knowledge and understanding of Target Heart Rate Range (THRR);Able to understand and use rate of perceived exertion (RPE) scale;Increase Physical Activity;Increase Strength and Stamina;Able to check pulse independently  Increase Physical Activity;Understanding of Exercise Prescription  Increase Physical Activity;Understanding of Exercise Prescription     Comments  Reviewed HEP with pt. Also reviewed THRR, RPE Scale, weather precautions, endpoints of exericse, NTG use, warmup and cool down.   Pt is responding very well to workloads. Pt states he is feeling stronger. Currently working with pt to increase workloads. Pt is now exercising at level 7 on Nustep. Will increase rower level on next exercise session. Pt also wants to get back to jogging. Will contact Dr. today to get clearance.   Reviewed METs and Goals with pt. Pt is not able to jog in rehab due to interim physician not providing clearance. Pt  still putting forth great effort when in rehab. Pt is still struggling with attendance. Pt is graduating from rehab next Wednesday. Will follow up with pt regarding exercising on his own post cardiac rehab      Expected Outcomes  Pt will continue to walk for exercise daily. Pt will continue to increase cardiovasular fitness. Will continue to monitor and progress pt.   Pt will continue to walk 7 days a wek. Pt will continue to improve his cardiovascular stamina. Will continue to monitor pt's progress.   Pt will continue to exericse 5-7 days a week. Pt will continue to improve cardiavascular fitness. Will continue to monitor.         Discharge Exercise Prescription (Final Exercise Prescription Changes): Exercise Prescription Changes - 09/17/18 1425      Response to Exercise   Blood Pressure (Admit)  124/76    Blood Pressure (Exercise)  164/70    Blood Pressure (Exit)  132/68    Heart Rate (Admit)  83 bpm    Heart Rate (Exercise)  143 bpm    Heart Rate (Exit)  126 bpm    Rating of Perceived Exertion (Exercise)  13    Perceived Dyspnea (Exercise)  0    Symptoms  Pt's HR exceeded Target, Had to slow down pt    Comments  None    Duration  Progress to 45 minutes of aerobic exercise without signs/symptoms of physical distress    Intensity  THRR unchanged      Progression   Progression  Continue to progress workloads to maintain intensity without signs/symptoms of physical distress.    Average METs  5.88      Resistance Training   Training Prescription  Yes    Weight  8lbs    Reps  10-15    Time  10 Minutes      Interval Training   Interval Training  No      Treadmill   MPH  3.3    Grade  4    Minutes  10    METs  5.35      NuStep   Level  7    SPM  110    Minutes  10    METs  5.5      Rower   Level  5    Watts  82    Minutes  10    METs  6.8      Home Exercise Plan   Plans to continue exercise at  Home (comment)   Walking   Frequency  Add 2 additional days to program  exercise sessions.    Initial Home Exercises Provided  07/11/18       Nutrition:  Target Goals: Understanding of nutrition guidelines, daily intake of sodium 1500mg , cholesterol 200mg , calories 30% from fat and 7% or less from saturated fats, daily to have 5 or more servings of fruits and vegetables.  Biometrics: Pre Biometrics - 06/26/18 1146      Pre Biometrics   Height  5' 10.75" (1.797 m)    Weight  79.8 kg    Waist Circumference  36 inches    Hip Circumference  38.5 inches    Waist to Hip Ratio  0.94 %    BMI (Calculated)  24.71    Triceps Skinfold  26 mm    % Body Fat  26.4 %    Grip Strength  41 kg    Flexibility  9 in    Single Leg Stand  30 seconds        Nutrition Therapy Plan and Nutrition Goals: Nutrition Therapy & Goals - 06/26/18 0902      Nutrition Therapy   Diet  consistent carbohydrate heart healthy      Personal Nutrition Goals   Nutrition Goal  pt able to name foods  that affect blood glucose    Personal Goal #2  pt to identify and limit food sources of sodium, saturated fat, trans fats, refined carbohydrates    Personal Goal #3  imporved blood glucose control as evidenced by pt's A1c trending from 7.4 toward less than 7.0      Intervention Plan   Intervention  Prescribe, educate and counsel regarding individualized specific dietary modifications aiming towards targeted core components such as weight, hypertension, lipid management, diabetes, heart failure and other comorbidities.    Expected Outcomes  Short Term Goal: Understand basic principles of dietary content, such as calories, fat, sodium, cholesterol and nutrients.       Nutrition Assessments: Nutrition Assessments - 06/26/18 0904      MEDFICTS Scores   Pre Score  9       Nutrition Goals Re-Evaluation:   Nutrition Goals Re-Evaluation:   Nutrition Goals Discharge (Final Nutrition Goals Re-Evaluation):   Psychosocial: Target Goals: Acknowledge presence or absence of significant  depression and/or stress, maximize coping skills, provide positive support system. Participant is able to verbalize types and ability to use techniques and skills needed for reducing stress and depression.  Initial Review & Psychosocial Screening: Initial Psych Review & Screening - 06/26/18 1227      Initial Review   Current issues with  None Identified      Family Dynamics  Good Support System?  Yes   Pt reports his wife and friends as sources of support for him.     Barriers   Psychosocial barriers to participate in program  There are no identifiable barriers or psychosocial needs.      Screening Interventions   Interventions  Encouraged to exercise       Quality of Life Scores: Quality of Life - 06/26/18 1121      Quality of Life   Select  Quality of Life      Quality of Life Scores   Health/Function Pre  20.9 %    Socioeconomic Pre  24.36 %    Psych/Spiritual Pre  20.25 %    Family Pre  26.4 %    GLOBAL Pre  22.35 %      Scores of 19 and below usually indicate a poorer quality of life in these areas.  A difference of  2-3 points is a clinically meaningful difference.  A difference of 2-3 points in the total score of the Quality of Life Index has been associated with significant improvement in overall quality of life, self-image, physical symptoms, and general health in studies assessing change in quality of life.  PHQ-9: Recent Review Flowsheet Data    Depression screen [facility] 2/9 06/30/2018 11/18/2015   Decreased Interest 0 0   Down, Depressed, Hopeless 0 0   PHQ - 2 Score 0 0     Interpretation of Total Score  Total Score Depression Severity:  1-4 = Minimal depression, 5-9 = Mild depression, 10-14 = Moderate depression, 15-19 = Moderately severe depression, 20-27 = Severe depression   Psychosocial Evaluation and Intervention: Psychosocial Evaluation - 06/30/18 1440      Psychosocial Evaluation & Interventions   Interventions  Encouraged to exercise with the  program and follow exercise prescription    Comments  No psychosocial needs identified. "Mark Shah" enjoys traveling, going to [employer], fishing, and reading.    Expected Outcomes  "Mark Shah" will continue to exhibit a positive outlook with good coping skills.    Continue Psychosocial Services   No Follow up required       Psychosocial Re-Evaluation: Psychosocial Re-Evaluation    Row Name 07/04/18 1622 08/06/18 1742 08/29/18 1405 09/24/18 1610       Psychosocial Re-Evaluation   Current issues with  None Identified  None Identified  None Identified  None Identified    Comments  No interventions necessary.  No interventions necessary.  No interventions necessary.  No interventions necessary.    Expected Outcomes  Mark Shah will maintian a positive outlook.   Mark Shah will maintian a positive outlook.   Mark Shah will maintian a positive outlook.   Mark Shah will maintian a positive outlook.     Interventions  Encouraged to attend Cardiac Rehabilitation for the exercise  Encouraged to attend Cardiac Rehabilitation for the exercise  Encouraged to attend Cardiac Rehabilitation for the exercise  Encouraged to attend Cardiac Rehabilitation for the exercise    Continue Psychosocial Services   No Follow up required  No Follow up required  No Follow up required  No Follow up required       Psychosocial Discharge (Final Psychosocial Re-Evaluation): Psychosocial Re-Evaluation - 09/24/18 9604      Psychosocial Re-Evaluation   Current issues with  None Identified    Comments  No interventions necessary.    Expected Outcomes  Mark Shah will maintian a positive outlook.     Interventions  Encouraged to attend Cardiac Rehabilitation for the exercise  Continue Psychosocial Services   No Follow up required       Vocational Rehabilitation: Provide vocational rehab assistance to qualifying candidates.   Vocational Rehab Evaluation & Intervention:   Education: Education Goals: Education classes will be provided on a weekly basis,  covering required topics. Participant will state understanding/return demonstration of topics presented.  Learning Barriers/Preferences: Learning Barriers/Preferences - 06/26/18 0840      Learning Barriers/Preferences   Learning Barriers  Sight    Learning Preferences  Written Material;Video;Pictoral;Computer/Internet       Education Topics: Count Your Pulse:  -Group instruction provided by verbal instruction, demonstration, patient participation and written materials to support subject.  Instructors address importance of being able to find your pulse and how to count your pulse when at home without a heart monitor.  Patients get hands on experience counting their pulse with staff help and individually.   Heart Attack, Angina, and Risk Factor Modification:  -Group instruction provided by verbal instruction, video, and written materials to support subject.  Instructors address signs and symptoms of angina and heart attacks.    Also discuss risk factors for heart disease and how to make changes to improve heart health risk factors.   Functional Fitness:  -Group instruction provided by verbal instruction, demonstration, patient participation, and written materials to support subject.  Instructors address safety measures for doing things around the house.  Discuss how to get up and down off the floor, how to pick things up properly, how to safely get out of a chair without assistance, and balance training.   Meditation and Mindfulness:  -Group instruction provided by verbal instruction, patient participation, and written materials to support subject.  Instructor addresses importance of mindfulness and meditation practice to help reduce stress and improve awareness.  Instructor also leads participants through a meditation exercise.    Stretching for Flexibility and Mobility:  -Group instruction provided by verbal instruction, patient participation, and written materials to support subject.   Instructors lead participants through series of stretches that are designed to increase flexibility thus improving mobility.  These stretches are additional exercise for major muscle groups that are typically performed during regular warm up and cool down.   Hands Only CPR:  -Group verbal, video, and participation provides a basic overview of AHA guidelines for community CPR. Role-play of emergencies allow participants the opportunity to practice calling for help and chest compression technique with discussion of AED use.   Hypertension: -Group verbal and written instruction that provides a basic overview of hypertension including the most recent diagnostic guidelines, risk factor reduction with self-care instructions and medication management.    Nutrition I class: Heart Healthy Eating:  -Group instruction provided by PowerPoint slides, verbal discussion, and written materials to support subject matter. The instructor gives an explanation and review of the Therapeutic Lifestyle Changes diet recommendations, which includes a discussion on lipid goals, dietary fat, sodium, fiber, plant stanol/sterol esters, sugar, and the components of a well-balanced, healthy diet.   Nutrition II class: Lifestyle Skills:  -Group instruction provided by PowerPoint slides, verbal discussion, and written materials to support subject matter. The instructor gives an explanation and review of label reading, grocery shopping for heart health, heart healthy recipe modifications, and ways to make healthier choices when eating out.   Diabetes Question & Answer:  -Group instruction provided by PowerPoint slides, verbal discussion, and written materials to support subject matter. The instructor gives an explanation and review of diabetes co-morbidities, pre- and post-prandial blood glucose goals, pre-exercise blood glucose  goals, signs, symptoms, and treatment of hypoglycemia and hyperglycemia, and foot care  basics.   Diabetes Blitz:  -Group instruction provided by PowerPoint slides, verbal discussion, and written materials to support subject matter. The instructor gives an explanation and review of the physiology behind type 1 and type 2 diabetes, diabetes medications and rational behind using different medications, pre- and post-prandial blood glucose recommendations and Hemoglobin A1c goals, diabetes diet, and exercise including blood glucose guidelines for exercising safely.    Portion Distortion:  -Group instruction provided by PowerPoint slides, verbal discussion, written materials, and food models to support subject matter. The instructor gives an explanation of serving size versus portion size, changes in portions sizes over the last 20 years, and what consists of a serving from each food group.   Stress Management:  -Group instruction provided by verbal instruction, video, and written materials to support subject matter.  Instructors review role of stress in heart disease and how to cope with stress positively.     CARDIAC REHAB PHASE II EXERCISE from 07/23/2018 in [facility] CARDIAC REHAB  Date  07/23/18  Educator  RN  Instruction Review Code  2- Demonstrated Understanding      Exercising on Your Own:  -Group instruction provided by verbal instruction, power point, and written materials to support subject.  Instructors discuss benefits of exercise, components of exercise, frequency and intensity of exercise, and end points for exercise.  Also discuss use of nitroglycerin and activating EMS.  Review options of places to exercise outside of rehab.  Review guidelines for sex with heart disease.   Cardiac Drugs I:  -Group instruction provided by verbal instruction and written materials to support subject.  Instructor reviews cardiac drug classes: antiplatelets, anticoagulants, beta blockers, and statins.  Instructor discusses reasons, side effects, and lifestyle  considerations for each drug class.   CARDIAC REHAB PHASE II EXERCISE from 07/23/2018 in [facility] CARDIAC REHAB  Date  07/16/18  Instruction Review Code  2- Demonstrated Understanding      Cardiac Drugs II:  -Group instruction provided by verbal instruction and written materials to support subject.  Instructor reviews cardiac drug classes: angiotensin converting enzyme inhibitors (ACE-I), angiotensin II receptor blockers (ARBs), nitrates, and calcium channel blockers.  Instructor discusses reasons, side effects, and lifestyle considerations for each drug class.   Anatomy and Physiology of the Circulatory System:  Group verbal and written instruction and models provide basic cardiac anatomy and physiology, with the coronary electrical and arterial systems. Review of: AMI, Angina, Valve disease, Heart Failure, Peripheral Artery Disease, Cardiac Arrhythmia, Pacemakers, and the ICD.   Other Education:  -Group or individual verbal, written, or video instructions that support the educational goals of the cardiac rehab program.   Holiday Eating Survival Tips:  -Group instruction provided by PowerPoint slides, verbal discussion, and written materials to support subject matter. The instructor gives patients tips, tricks, and techniques to help them not only survive but enjoy the holidays despite the onslaught of food that accompanies the holidays.   Knowledge Questionnaire Score: Knowledge Questionnaire Score - 06/26/18 1121      Knowledge Questionnaire Score   Pre Score  21/24       Core Components/Risk Factors/Patient Goals at Admission: Personal Goals and Risk Factors at Admission - 06/26/18 1148      Core Components/Risk Factors/Patient Goals on Admission    Weight Management  Yes;Weight Loss    Intervention  Weight Management: Develop a combined nutrition and exercise program designed to  reach desired caloric intake, while maintaining appropriate intake of nutrient  and fiber, sodium and fats, and appropriate energy expenditure required for the weight goal.;Weight Management: Provide education and appropriate resources to help participant work on and attain dietary goals.;Weight Management/Obesity: Establish reasonable short term and long term weight goals.    Admit Weight  175 lb 14.8 oz (79.8 kg)    Goal Weight: Long Term  165 lb (74.8 kg)    Expected Outcomes  Short Term: Continue to assess and modify interventions until short term weight is achieved;Long Term: Adherence to nutrition and physical activity/exercise program aimed toward attainment of established weight goal;Weight Loss: Understanding of general recommendations for a balanced deficit meal plan, which promotes 1-2 lb weight loss per week and includes a negative energy balance of [id number] kcal/d;Understanding recommendations for meals to include 15-35% energy as protein, 25-35% energy from fat, 35-60% energy from carbohydrates, less than 200mg  of dietary cholesterol, 20-35 gm of total fiber daily;Understanding of distribution of calorie intake throughout the day with the consumption of 4-5 meals/snacks       Core Components/Risk Factors/Patient Goals Review:  Goals and Risk Factor Review    Row Name 06/30/18 1442 07/04/18 1622 08/06/18 1742 08/29/18 1405 09/24/18 0938     Core Components/Risk Factors/Patient Goals Review   Personal Goals Review  Weight Management/Obesity;Lipids;Hypertension;Diabetes  Weight Management/Obesity;Lipids;Hypertension;Diabetes  Weight Management/Obesity;Lipids;Hypertension;Diabetes  Weight Management/Obesity;Lipids;Hypertension;Diabetes  Weight Management/Obesity;Lipids;Hypertension;Diabetes   Review  Pt with multiple CAD RFs willing to participate in CR exercise. "Mark Shah" would like to increase his strength and stamina.  Pt with multiple CAD RFs willing to participate in CR exercise. "Mark Shah" would like to increase his strength and stamina.  Pt with multiple CAD RFs willing to  participate in CR exercise.  Mark Shah is doing well with exercise.  He feels that he is increasing his strength.   Pt with multiple CAD RFs willing to participate in CR exercise.  Mark Shah continues to enjoy CR.  He likes the structure and routine that CR provides.   Pt with multiple CAD RFs willing to participate in CR exercise.  Mark Shah continues to enjoy the structure and routine provided by CR.  He will graduate soon on 11/27.   Expected Outcomes  Pt will continue to participate in CR exercise, nutrition, and lifestyle modification opportunities.   Pt will continue to participate in CR exercise, nutrition, and lifestyle modification opportunities.   Pt will continue to participate in CR exercise, nutrition, and lifestyle modification opportunities.   Pt will continue to participate in CR exercise, nutrition, and lifestyle modification opportunities.   Pt will continue to participate in CR exercise, nutrition, and lifestyle modification opportunities.       Core Components/Risk Factors/Patient Goals at Discharge (Final Review):  Goals and Risk Factor Review - 09/24/18 0938      Core Components/Risk Factors/Patient Goals Review   Personal Goals Review  Weight Management/Obesity;Lipids;Hypertension;Diabetes    Review  Pt with multiple CAD RFs willing to participate in CR exercise.  Mark Shah continues to enjoy the structure and routine provided by CR.  He will graduate soon on 11/27.    Expected Outcomes  Pt will continue to participate in CR exercise, nutrition, and lifestyle modification opportunities.        ITP Comments: ITP Comments    Row Name 06/26/18 0837 06/30/18 1103 07/04/18 1621 08/06/18 1741 08/29/18 1405   ITP Comments  Dr. Armanda Magic, Medical Director  Pt started exercise today and tolerated it well.   30 Day ITP Review.  Mark Shah is off to a great start and is tolerating exercise well.   30 Day ITP Review.  Mark Shah is doing well.  He feels that he is increasing his strength. VSS.  30 Day ITP Review. Mark Shah  continues to tolerate exercise well.  He enjoys the structure and routine provided by CR.   Row Name 09/24/18 0936           ITP Comments  30 Day ITP Review.  Mark Shah continues to enjoy the structure and routine when he is able to come to exercise.  He has missed days d/t illness and work.  He will graduate soon on 11/27.           Comments: See ITP Comments.

## 2018-09-26 ENCOUNTER — Encounter (HOSPITAL_COMMUNITY): Payer: BLUE CROSS/BLUE SHIELD

## 2018-09-29 ENCOUNTER — Encounter (HOSPITAL_COMMUNITY): Payer: BLUE CROSS/BLUE SHIELD

## 2018-09-29 ENCOUNTER — Telehealth (HOSPITAL_COMMUNITY): Payer: Self-pay | Admitting: *Deleted

## 2018-09-29 NOTE — Telephone Encounter (Signed)
Received message from patient about calling out of CR due to continued sicknesses.  Pt called back and VM left.  Pt's last day of CR is 10/01/18.

## 2018-10-01 ENCOUNTER — Encounter (HOSPITAL_COMMUNITY): Payer: BLUE CROSS/BLUE SHIELD

## 2018-10-08 ENCOUNTER — Telehealth: Payer: Self-pay | Admitting: Cardiology

## 2018-10-08 NOTE — Telephone Encounter (Signed)
LAM for patient to call back to schedule appt, offered Thursday or Friday appt.

## 2018-10-08 NOTE — Addendum Note (Signed)
Encounter addended by: York CeriseNevels, Vesta Wheeland R on: 10/08/2018 8:25 AM  Actions taken: Visit Navigator Flowsheet section accepted

## 2018-10-09 ENCOUNTER — Encounter: Payer: Self-pay | Admitting: Cardiology

## 2018-10-09 ENCOUNTER — Ambulatory Visit (INDEPENDENT_AMBULATORY_CARE_PROVIDER_SITE_OTHER): Payer: BLUE CROSS/BLUE SHIELD | Admitting: Cardiology

## 2018-10-09 VITALS — BP 120/62 | HR 86 | Ht 70.75 in | Wt 174.0 lb

## 2018-10-09 DIAGNOSIS — E785 Hyperlipidemia, unspecified: Secondary | ICD-10-CM | POA: Diagnosis not present

## 2018-10-09 DIAGNOSIS — E1069 Type 1 diabetes mellitus with other specified complication: Secondary | ICD-10-CM | POA: Diagnosis not present

## 2018-10-09 DIAGNOSIS — I10 Essential (primary) hypertension: Secondary | ICD-10-CM

## 2018-10-09 DIAGNOSIS — I2510 Atherosclerotic heart disease of native coronary artery without angina pectoris: Secondary | ICD-10-CM

## 2018-10-09 NOTE — Addendum Note (Signed)
Addended by: Craige CottaANDERSON, Ronisha Herringshaw S on: 10/09/2018 02:14 PM   Modules accepted: Orders

## 2018-10-09 NOTE — Progress Notes (Signed)
Cardiology Office Note:    Date:  10/09/2018   ID:  Mark Shah, DOB 11-22-1955, MRN [id number]  PCP:  Catha GosselinLittle, Kevin, MD  Cardiologist:  Garwin Brothersajan R Revankar, MD    Referring MD: Catha GosselinLittle, Kevin, MD    ASSESSMENT:    1. Coronary artery disease involving native coronary artery of native heart without angina pectoris   2. Essential hypertension   3. Hyperlipidemia due to type 1 diabetes mellitus (HCC)    PLAN:    In order of problems listed above:  1. Secondary prevention stressed with the patient.  Importance of compliance with diet and medication stressed and he vocalized understanding.  Blood pressure is stable.  Diet was discussed for dyslipidemia and diabetes mellitus and he vocalized understanding.  Importance of regular exercise at least 30 minutes a day at least 5 days a week were emphasized and he plans to continue doing that 2. Sublingual nitroglycerin prescription was sent, its protocol and 911 protocol explained and the patient vocalized understanding questions were answered to the patient's satisfaction 3. In view of his palpitations I wanted to do a event monitor for a Holter monitor but since they have resolved in the past 2 days he is not keen on it.  I respect his wishes.  He mentions to me that he had blood work done recently especially TSH by his endocrinologist and it was fine. 4. He will be back in the next few days for fasting blood work including lipids 5. Patient will be seen in follow-up appointment in 6 months or earlier if the patient has any concerns 6. Total time for this evaluation was 30 minutes.   Medication Adjustments/Labs and Tests Ordered: Current medicines are reviewed at length with the patient today.  Concerns regarding medicines are outlined above.  No orders of the defined types were placed in this encounter.  No orders of the defined types were placed in this encounter.    No chief complaint on file.    History of Present Illness:     Mark Shah is a 62 y.o. male.  Patient has past medical history of coronary artery disease and several months ago underwent coronary stenting.  Subsequently is done fine.  No chest pain orthopnea or PND he walks on a regular basis except for the past 2 or 3 weeks but he is down with a viral infection which is now recovering from.  He has history of essential hypertension, diabetes mellitus and dyslipidemia.  At the time of my evaluation, the patient is alert awake oriented and in no distress.  Patient mentions to me that he is experienced palpitations in the past week.  He felt that his heart rate was going at 80 to 90/min and he checked his pulse.  He had awareness of his heartbeat.  No dizziness lightheadedness or any such symptoms.  At the time of my evaluation, the patient is alert awake oriented and in no distress.  Since the past 2 days his symptoms have resolved.  At the time of my evaluation, the patient is alert awake oriented and in no distress.  Past Medical History:  Diagnosis Date  . Adhesive capsulitis    in shoulders  . Diabetes mellitus without complication (HCC)   . Diverticulitis   . Dyslipidemia   . ED (erectile dysfunction)   . HTN (hypertension)   . Hyperlipidemia   . Hypothyroidism   . PONV (postoperative nausea and vomiting)     Past Surgical History:  Procedure Laterality Date  .  BACK SURGERY    . CERVICAL DISC SURGERY    . CORONARY STENT INTERVENTION N/A 05/13/2018   Procedure: CORONARY STENT INTERVENTION;  Surgeon: Marykay Lex, MD;  Location: [facility] INVASIVE CV LAB;  Service: Cardiovascular;  Laterality: N/A;  . left hand    . LEFT HEART CATH AND CORONARY ANGIOGRAPHY N/A 05/13/2018   Procedure: LEFT HEART CATH AND CORONARY ANGIOGRAPHY;  Surgeon: Marykay Lex, MD;  Location: [facility] INVASIVE CV LAB;  Service: Cardiovascular;  Laterality: N/A;    Current Medications: Current Meds  Medication Sig  . azelastine (ASTELIN) 0.1 % nasal spray Place 1 spray into  both nostrils 2 (two) times daily. Use in each nostril as directed  . Cholecalciferol (VITAMIN D3) 3000 units TABS Take 1 tablet by mouth daily.  . clopidogrel (PLAVIX) 75 MG tablet Take 1 tablet (75 mg total) by mouth daily.  . CONTOUR NEXT TEST test strip   . fluticasone (FLONASE) 50 MCG/ACT nasal spray USE 1 SPRAY IN EACH NOSTRIL DAILY PRN FOR ALLERGIES  . Glucagon, rDNA, (GLUCAGON EMERGENCY IJ) Inject 1 mg as directed as needed (as directed for diabetes.Marland Kitchen).  Marland Kitchen insulin glargine (LANTUS) 100 UNIT/ML injection Inject 11 Units into the skin See admin instructions. ONLY IF INSULIN PUMP CAN NOT BE USED   . insulin lispro (HUMALOG) 100 UNIT/ML injection Inject into the skin as directed. As directed with insulin pump. No unites given.  . levothyroxine (SYNTHROID, LEVOTHROID) 200 MCG tablet Take 200 mcg by mouth daily before breakfast.  . loratadine (CLARITIN) 10 MG tablet Take 10 mg by mouth daily as needed for allergies.  . metoprolol succinate (TOPROL-XL) 25 MG 24 hr tablet Take 25 mg by mouth daily.  . nitroGLYCERIN (NITROSTAT) 0.4 MG SL tablet Place 1 tablet under the tongue every 5 (five) minutes as needed for chest pain.   . rosuvastatin (CRESTOR) 10 MG tablet Take 10 mg by mouth daily.  . sildenafil (VIAGRA) 100 MG tablet Take 50 mg by mouth daily as needed for erectile dysfunction.   Marland Kitchen SYNTHROID 175 MCG tablet Take 175 mcg by mouth daily before breakfast.   . telmisartan (MICARDIS) 40 MG tablet Take 40 mg by mouth daily.  Marland Kitchen zolpidem (AMBIEN) 10 MG tablet Take 5 mg by mouth at bedtime as needed for sleep.      Allergies:   Erythromycin   Social History   Socioeconomic History  . Marital status: Married    Spouse name: Not on file  . Number of children: Not on file  . Years of education: Not on file  . Highest education level: Not on file  Occupational History  . Not on file  Social Needs  . Financial resource strain: Not on file  . Food insecurity:    Worry: Not on file     Inability: Not on file  . Transportation needs:    Medical: Not on file    Non-medical: Not on file  Tobacco Use  . Smoking status: Former Games developer  . Smokeless tobacco: Never Used  Substance and Sexual Activity  . Alcohol use: Yes  . Drug use: No  . Sexual activity: Yes  Lifestyle  . Physical activity:    Days per week: Not on file    Minutes per session: Not on file  . Stress: Not on file  Relationships  . Social connections:    Talks on phone: Not on file    Gets together: Not on file    Attends religious service: Not on file  Active member of club or organization: Not on file    Attends meetings of clubs or organizations: Not on file    Relationship status: Not on file  Other Topics Concern  . Not on file  Social History Narrative  . Not on file     Family History: The patient's family history includes Asthma in his father and mother; COPD in his father; CVA in his paternal grandfather; Cancer - Lung in his maternal grandfather; Hyperlipidemia in his mother and paternal grandfather; Stroke in his paternal grandfather.  ROS:   Please see the history of present illness.    All other systems reviewed and are negative.  EKGs/Labs/Other Studies Reviewed:    The following studies were reviewed today: I discussed my findings with the patient at extensive length and reviewed records from his cardiologist office.    Recent Labs: 05/13/2018: BUN 6; Creatinine, Ser 1.03; Hemoglobin 12.7; Platelets 224; Potassium 4.1; Sodium 135  Recent Lipid Panel No results found for: CHOL, TRIG, HDL, CHOLHDL, VLDL, LDLCALC, LDLDIRECT  Physical Exam:    VS:  BP 120/62 (BP Location: Right Arm, Patient Position: Sitting, Cuff Size: Normal)   Pulse 86   Ht 5' 10.75" (1.797 m)   Wt 174 lb (78.9 kg)   SpO2 99%   BMI 24.44 kg/m     Wt Readings from Last 3 Encounters:  10/09/18 174 lb (78.9 kg)  09/21/18 176 lb 6.4 oz (80 kg)  06/26/18 175 lb 14.8 oz (79.8 kg)     GEN: Patient is in  no acute distress HEENT: Normal NECK: No JVD; No carotid bruits LYMPHATICS: No lymphadenopathy CARDIAC: Hear sounds regular, 2/6 systolic murmur at the apex. RESPIRATORY:  Clear to auscultation without rales, wheezing or rhonchi  ABDOMEN: Soft, non-tender, non-distended MUSCULOSKELETAL:  No edema; No deformity  SKIN: Warm and dry NEUROLOGIC:  Alert and oriented x 3 PSYCHIATRIC:  Normal affect   Signed, Garwin Brothers, MD  10/09/2018 2:06 PM    [facility]

## 2018-10-09 NOTE — Patient Instructions (Signed)
Medication Instructions:  Your physician recommends that you continue on your current medications as directed. Please refer to the Current Medication list given to you today.  If you need a refill on your cardiac medications before your next appointment, please call your pharmacy.   Lab work: Your physician recommends that you have the following labs drawn: Please return to the office fasting for BMP, CBC, Liver and lipid panel. No appointment is needed for labs.  If you have labs (blood work) drawn today and your tests are completely normal, you will receive your results only by: Marland Kitchen. MyChart Message (if you have MyChart) OR . A paper copy in the mail If you have any lab test that is abnormal or we need to change your treatment, we will call you to review the results.  Testing/Procedures: None  Follow-Up: At [facility], you and your health needs are our priority.  As part of our continuing mission to provide you with exceptional heart care, we have created designated Provider Care Teams.  These Care Teams include your primary Cardiologist (physician) and Advanced Practice Providers (APPs -  Physician Assistants and Nurse Practitioners) who all work together to provide you with the care you need, when you need it.  You will need a follow up appointment in 6 months.  Please call our office 2 months in advance to schedule this appointment.  You may see another member of our [facility] [place] Team in [place]: Gypsy Balsamobert Krasowski, MD . Norman HerrlichBrian Munley, MD  Any Other Special Instructions Will Be Listed Below (If Applicable).

## 2018-10-10 NOTE — Addendum Note (Signed)
Encounter addended by: Enid SkeensBurkin, Jaxen Samples, RD on: 10/10/2018 9:29 AM  Actions taken: Flowsheet data copied forward, Visit Navigator Flowsheet section accepted

## 2018-10-16 NOTE — Addendum Note (Signed)
Encounter addended by: Nikki DomEverett, Aeon Kessner G, RN on: 10/16/2018 4:47 PM  Actions taken: Flowsheet data copied forward, Visit Navigator Flowsheet section accepted, Clinical Note Signed, Episode resolved

## 2018-10-16 NOTE — Progress Notes (Signed)
Discharge Progress Report  Patient Details  Name: Mark Shah MRN: [id number] Date of Birth: 23-Oct-1956 Referring Provider:     CARDIAC REHAB PHASE II ORIENTATION from 06/26/2018 in [facility]  Referring Provider  Mark Fish MD        Number of Visits: 22  Reason for Discharge:  Early Exit:  Lack of attendance  Smoking History:  Social History   Tobacco Use  Smoking Status Former Smoker  Smokeless Tobacco Never Used    Diagnosis:  05/13/18 Status post coronary artery stent placement DES x 2 LAD, diag  ADL [facility]:   Initial Exercise Prescription: Initial Exercise Prescription - 06/26/18 1100      Date of Initial Exercise RX and Referring Provider   Date  06/26/18    Referring Provider  Mark Fish MD     Expected Discharge Date  09/18/18      Treadmill   MPH  3.2    Grade  1    Minutes  10    METs  3.89      NuStep   Level  2    SPM  80    Minutes  10    METs  3      Rower   Level  3    Watts  25    Minutes  10    METs  4      Prescription Details   Frequency (times per week)  3x    Duration  Progress to 30 minutes of continuous aerobic without signs/symptoms of physical distress      Intensity   THRR 40-80% of Max Heartrate  63-126    Ratings of Perceived Exertion  11-13    Perceived Dyspnea  0-4      Progression   Progression  Continue progressive overload as per policy without signs/symptoms or physical distress.      Resistance Training   Training Prescription  Yes    Weight  4lbs    Reps  10-15       Discharge Exercise Prescription (Final Exercise Prescription Changes): Exercise Prescription Changes - 09/17/18 1425      Response to Exercise   Blood Pressure (Admit)  124/76    Blood Pressure (Exercise)  164/70    Blood Pressure (Exit)  132/68    Heart Rate (Admit)  83 bpm    Heart Rate (Exercise)  143 bpm    Heart Rate (Exit)  126 bpm    Rating of Perceived Exertion (Exercise)  13    Perceived Dyspnea (Exercise)  0    Symptoms  Pt's HR exceeded Target, Had to slow down pt    Comments  None    Duration  Progress to 45 minutes of aerobic exercise without signs/symptoms of physical distress    Intensity  THRR unchanged      Progression   Progression  Continue to progress workloads to maintain intensity without signs/symptoms of physical distress.    Average METs  5.88      Resistance Training   Training Prescription  Yes    Weight  8lbs    Reps  10-15    Time  10 Minutes      Interval Training   Interval Training  No      Treadmill   MPH  3.3    Grade  4    Minutes  10    METs  5.35      NuStep   Level  7    SPM  110    Minutes  10    METs  5.5      Rower   Level  5    Watts  82    Minutes  10    METs  6.8      Home Exercise Plan   Plans to continue exercise at  Home (comment)   Walking   Frequency  Add 2 additional days to program exercise sessions.    Initial Home Exercises Provided  07/11/18       Functional Capacity: 6 Minute Walk    Row Name 06/26/18 1127         6 Minute Walk   Phase  Initial     Distance  1619 feet     Walk Time  6 minutes     # of Rest Breaks  0     MPH  3.1     METS  3.94     RPE  9     Perceived Dyspnea   0     VO2 Peak  13.79     Symptoms  No     Resting HR  79 bpm     Resting BP  102/62     Resting Oxygen Saturation   99 %     Exercise Oxygen Saturation  during 6 min walk  100 %     Max Ex. HR  89 bpm     Max Ex. BP  118/60     2 Minute Post BP  115/74        Psychological, QOL, Others - Outcomes: PHQ 2/9: [facility] 06/30/2018 11/18/2015  Decreased Interest 0 0  Down, Depressed, Hopeless 0 0  PHQ - 2 Score 0 0    Quality of Life: Quality of Life - 06/26/18 1121      Quality of Life   Select  Quality of Life      Quality of Life Scores   Health/Function Pre  20.9 %    Socioeconomic Pre  24.36 %    Psych/Spiritual Pre  20.25 %    Family Pre  26.4 %    GLOBAL Pre  22.35  %       Personal Goals: Goals established at orientation with interventions provided to work toward goal. Personal Goals and Risk Factors at Admission - 06/26/18 1148      Core Components/Risk Factors/Patient Goals on Admission    Weight Management  Yes;Weight Loss    Intervention  Weight Management: Develop a combined nutrition and exercise program designed to reach desired caloric intake, while maintaining appropriate intake of nutrient and fiber, sodium and fats, and appropriate energy expenditure required for the weight goal.;Weight Management: Provide education and appropriate resources to help participant work on and attain dietary goals.;Weight Management/Obesity: Establish reasonable short term and long term weight goals.    Admit Weight  175 lb 14.8 oz (79.8 kg)    Goal Weight: Long Term  165 lb (74.8 kg)    Expected Outcomes  Short Term: Continue to assess and modify interventions until short term weight is achieved;Long Term: Adherence to nutrition and physical activity/exercise program aimed toward attainment of established weight goal;Weight Loss: Understanding of general recommendations for a balanced deficit meal plan, which promotes 1-2 lb weight loss per week and includes a negative energy balance of [id number] kcal/d;Understanding recommendations for meals to include 15-35% energy as protein, 25-35% energy from fat, 35-60% energy from carbohydrates, less  than 200mg  of dietary cholesterol, 20-35 gm of total fiber daily;Understanding of distribution of calorie intake throughout the day with the consumption of 4-5 meals/snacks        Personal Goals Discharge: Goals and Risk Factor Review    Row Name 06/30/18 1442 07/04/18 1622 08/06/18 1742 08/29/18 1405 09/24/18 0938     Core Components/Risk Factors/Patient Goals Review   Personal Goals Review  Weight Management/Obesity;Lipids;Hypertension;Diabetes  Weight Management/Obesity;Lipids;Hypertension;Diabetes  Weight  Management/Obesity;Lipids;Hypertension;Diabetes  Weight Management/Obesity;Lipids;Hypertension;Diabetes  Weight Management/Obesity;Lipids;Hypertension;Diabetes   Review  Pt with multiple CAD RFs willing to participate in CR exercise. "Mark Shah" would like to increase his strength and stamina.  Pt with multiple CAD RFs willing to participate in CR exercise. "Mark Shah" would like to increase his strength and stamina.  Pt with multiple CAD RFs willing to participate in CR exercise.  Dellie Burns is doing well with exercise.  He feels that he is increasing his strength.   Pt with multiple CAD RFs willing to participate in CR exercise.  Mark Shah continues to enjoy CR.  He likes the structure and routine that CR provides.   Pt with multiple CAD RFs willing to participate in CR exercise.  Mark Shah continues to enjoy the structure and routine provided by CR.  He will graduate soon on 11/27.   Expected Outcomes  Pt will continue to participate in CR exercise, nutrition, and lifestyle modification opportunities.   Pt will continue to participate in CR exercise, nutrition, and lifestyle modification opportunities.   Pt will continue to participate in CR exercise, nutrition, and lifestyle modification opportunities.   Pt will continue to participate in CR exercise, nutrition, and lifestyle modification opportunities.   Pt will continue to participate in CR exercise, nutrition, and lifestyle modification opportunities.    Row Name 10/16/18 1642             Core Components/Risk Factors/Patient Goals Review   Personal Goals Review  Weight Management/Obesity;Lipids;Hypertension;Diabetes       Review  Pt has been discharged from the CR Program.  He did not complete a post walk test d/t continued absences from illnesses.  Mark Shah appreciated his time in CR and felt he gained an exercise routine.        Expected Outcomes  Pt will continue to participate in exercise, nutrition, and lifestyle modification opportunities.  He plans to utilize his knowledge of  an exercise routine at a local gym.           Exercise Goals and Review: Exercise Goals    Row Name 06/26/18 0839             Exercise Goals   Increase Physical Activity  Yes       Intervention  Provide advice, education, support and counseling about physical activity/exercise needs.;Develop an individualized exercise prescription for aerobic and resistive training based on initial evaluation findings, risk stratification, comorbidities and participant's personal goals.       Expected Outcomes  Short Term: Attend rehab on a regular basis to increase amount of physical activity.;Long Term: Exercising regularly at least 3-5 days a week.;Long Term: Add in home exercise to make exercise part of routine and to increase amount of physical activity.       Increase Strength and Stamina  Yes get back to jogging/running       Intervention  Provide advice, education, support and counseling about physical activity/exercise needs.;Develop an individualized exercise prescription for aerobic and resistive training based on initial evaluation findings, risk stratification, comorbidities and participant's personal goals.  Expected Outcomes  Short Term: Increase workloads from initial exercise prescription for resistance, speed, and METs.;Short Term: Perform resistance training exercises routinely during rehab and add in resistance training at home;Long Term: Improve cardiorespiratory fitness, muscular endurance and strength as measured by increased METs and functional capacity ( )       Able to understand and use rate of perceived exertion (RPE) scale  Yes       Intervention  Provide education and explanation on how to use RPE scale       Expected Outcomes  Short Term: Able to use RPE daily in rehab to express subjective intensity level;Long Term:  Able to use RPE to guide intensity level when exercising independently       Knowledge and understanding of Target Heart Rate Range (THRR)  Yes        Intervention  Provide education and explanation of THRR including how the numbers were predicted and where they are located for reference       Expected Outcomes  Short Term: Able to state/look up THRR;Long Term: Able to use THRR to govern intensity when exercising independently;Short Term: Able to use daily as guideline for intensity in rehab       Able to check pulse independently  Yes       Intervention  Provide education and demonstration on how to check pulse in carotid and radial arteries.;Review the importance of being able to check your own pulse for safety during independent exercise       Expected Outcomes  Short Term: Able to explain why pulse checking is important during independent exercise;Long Term: Able to check pulse independently and accurately       Understanding of Exercise Prescription  Yes       Intervention  Provide education, explanation, and written materials on patient's individual exercise prescription       Expected Outcomes  Short Term: Able to explain program exercise prescription;Long Term: Able to explain home exercise prescription to exercise independently          Exercise Goals Re-Evaluation: Exercise Goals Re-Evaluation    Row Name 07/21/18 1459 09/01/18 0920 09/22/18 1426         Exercise Goal Re-Evaluation   Exercise Goals Review  Understanding of Exercise Prescription;Knowledge and understanding of Target Heart Rate Range (THRR);Able to understand and use rate of perceived exertion (RPE) scale;Increase Physical Activity;Increase Strength and Stamina;Able to check pulse independently  Increase Physical Activity;Understanding of Exercise Prescription  Increase Physical Activity;Understanding of Exercise Prescription     Comments  Reviewed HEP with pt. Also reviewed THRR, RPE Scale, weather precautions, endpoints of exericse, NTG use, warmup and cool down.   Pt is responding very well to workloads. Pt states he is feeling stronger. Currently working with pt to  increase workloads. Pt is now exercising at level 7 on Nustep. Will increase rower level on next exercise session. Pt also wants to get back to jogging. Will contact Dr. today to get clearance.   Reviewed METs and Goals with pt. Pt is not able to jog in rehab due to interim physician not providing clearance. Pt still putting forth great effort when in rehab. Pt is still struggling with attendance. Pt is graduating from rehab next Wednesday. Will follow up with pt regarding exercising on his own post cardiac rehab      Expected Outcomes  Pt will continue to walk for exercise daily. Pt will continue to increase cardiovasular fitness. Will continue to monitor and progress pt.  Pt will continue to walk 7 days a wek. Pt will continue to improve his cardiovascular stamina. Will continue to monitor pt's progress.   Pt will continue to exericse 5-7 days a week. Pt will continue to improve cardiavascular fitness. Will continue to monitor.         Nutrition & Weight - Outcomes: Pre Biometrics - 06/26/18 1146      Pre Biometrics   Height  5' 10.75" (1.797 m)    Weight  79.8 kg    Waist Circumference  36 inches    Hip Circumference  38.5 inches    Waist to Hip Ratio  0.94 %    BMI (Calculated)  24.71    Triceps Skinfold  26 mm    % Body Fat  26.4 %    Grip Strength  41 kg    Flexibility  9 in    Single Leg Stand  30 seconds        Nutrition: Nutrition Therapy & Goals - 10/10/18 0929      Nutrition Therapy   Diet  consistent carbohydrate heart healthy      Personal Nutrition Goals   Nutrition Goal  pt able to name foods  that affect blood glucose    Personal Goal #2  pt to identify and limit food sources of sodium, saturated fat, trans fats, refined carbohydrates    Personal Goal #3  imporved blood glucose control as evidenced by pt's A1c trending from 7.4 toward less than 7.0      Intervention Plan   Intervention  Prescribe, educate and counsel regarding individualized specific dietary  modifications aiming towards targeted core components such as weight, hypertension, lipid management, diabetes, heart failure and other comorbidities.    Expected Outcomes  Short Term Goal: Understand basic principles of dietary content, such as calories, fat, sodium, cholesterol and nutrients.       Nutrition Discharge: Nutrition Assessments - 10/10/18 0927      MEDFICTS Scores   Pre Score  9    Post Score  --   unable to assess as pt did not return survey      Education Questionnaire Score: Knowledge Questionnaire Score - 06/26/18 1121      Knowledge Questionnaire Score   Pre Score  21/24       Goals reviewed with patient; copy given to patient.

## 2018-10-21 DIAGNOSIS — Z794 Long term (current) use of insulin: Secondary | ICD-10-CM | POA: Diagnosis not present

## 2018-10-21 DIAGNOSIS — E108 Type 1 diabetes mellitus with unspecified complications: Secondary | ICD-10-CM | POA: Diagnosis not present

## 2019-01-03 DIAGNOSIS — E7800 Pure hypercholesterolemia, unspecified: Secondary | ICD-10-CM

## 2019-01-03 DIAGNOSIS — I4891 Unspecified atrial fibrillation: Secondary | ICD-10-CM | POA: Insufficient documentation

## 2019-01-03 HISTORY — DX: Pure hypercholesterolemia, unspecified: E78.00

## 2019-01-03 HISTORY — DX: Unspecified atrial fibrillation: I48.91

## 2019-01-05 MED ORDER — EZETIMIBE 10 MG PO TABS
10.00 | ORAL_TABLET | ORAL | Status: DC
Start: 2019-01-06 — End: 2019-01-05

## 2019-01-05 MED ORDER — GENERIC EXTERNAL MEDICATION
5.00 | Status: DC
Start: ? — End: 2019-01-05

## 2019-01-05 MED ORDER — FLUTICASONE PROPIONATE 50 MCG/ACT NA SUSP
1.00 | NASAL | Status: DC
Start: 2019-01-07 — End: 2019-01-05

## 2019-01-05 MED ORDER — MELATONIN 3 MG PO TABS
3.00 | ORAL_TABLET | ORAL | Status: DC
Start: ? — End: 2019-01-05

## 2019-01-05 MED ORDER — METOPROLOL TARTRATE 25 MG PO TABS
25.00 | ORAL_TABLET | ORAL | Status: DC
Start: 2019-01-06 — End: 2019-01-05

## 2019-01-05 MED ORDER — APIXABAN 5 MG PO TABS
5.00 | ORAL_TABLET | ORAL | Status: DC
Start: 2019-01-06 — End: 2019-01-05

## 2019-01-05 MED ORDER — GENERIC EXTERNAL MEDICATION
150.00 | Status: DC
Start: 2019-01-08 — End: 2019-01-05

## 2019-01-05 MED ORDER — GENERIC EXTERNAL MEDICATION
12.50 | Status: DC
Start: ? — End: 2019-01-05

## 2019-01-05 MED ORDER — CLOPIDOGREL BISULFATE 75 MG PO TABS
75.00 | ORAL_TABLET | ORAL | Status: DC
Start: 2019-01-07 — End: 2019-01-05

## 2019-01-05 MED ORDER — BENZOCAINE-MENTHOL 15-3.6 MG MT LOZG
1.00 | LOZENGE | OROMUCOSAL | Status: DC
Start: ? — End: 2019-01-05

## 2019-01-05 MED ORDER — GENERIC EXTERNAL MEDICATION
10.00 | Status: DC
Start: ? — End: 2019-01-05

## 2019-01-05 MED ORDER — GENERIC EXTERNAL MEDICATION
650.00 | Status: DC
Start: ? — End: 2019-01-05

## 2019-01-05 MED ORDER — GENERIC EXTERNAL MEDICATION
Status: DC
Start: ? — End: 2019-01-05

## 2019-01-05 MED ORDER — LEVOTHYROXINE SODIUM 100 MCG PO TABS
200.00 | ORAL_TABLET | ORAL | Status: DC
Start: ? — End: 2019-01-05

## 2019-01-05 MED ORDER — GENERIC EXTERNAL MEDICATION
4.00 | Status: DC
Start: ? — End: 2019-01-05

## 2019-01-05 MED ORDER — POLYETHYLENE GLYCOL 3350 17 G PO PACK
17.00 | PACK | ORAL | Status: DC
Start: ? — End: 2019-01-05

## 2019-01-07 ENCOUNTER — Ambulatory Visit: Payer: BLUE CROSS/BLUE SHIELD | Admitting: Cardiology

## 2019-01-07 ENCOUNTER — Encounter: Payer: Self-pay | Admitting: Cardiology

## 2019-01-07 VITALS — BP 122/58 | HR 63 | Ht 70.75 in | Wt 176.0 lb

## 2019-01-07 DIAGNOSIS — I4892 Unspecified atrial flutter: Secondary | ICD-10-CM

## 2019-01-07 DIAGNOSIS — I10 Essential (primary) hypertension: Secondary | ICD-10-CM | POA: Diagnosis not present

## 2019-01-07 DIAGNOSIS — E7800 Pure hypercholesterolemia, unspecified: Secondary | ICD-10-CM

## 2019-01-07 DIAGNOSIS — E782 Mixed hyperlipidemia: Secondary | ICD-10-CM | POA: Insufficient documentation

## 2019-01-07 DIAGNOSIS — I2510 Atherosclerotic heart disease of native coronary artery without angina pectoris: Secondary | ICD-10-CM

## 2019-01-07 HISTORY — DX: Mixed hyperlipidemia: E78.2

## 2019-01-07 HISTORY — DX: Unspecified atrial flutter: I48.92

## 2019-01-07 MED ORDER — GENERIC EXTERNAL MEDICATION
300.00 | Status: DC
Start: ? — End: 2019-01-07

## 2019-01-07 MED ORDER — GENERIC EXTERNAL MEDICATION
5.00 | Status: DC
Start: ? — End: 2019-01-07

## 2019-01-07 NOTE — Patient Instructions (Addendum)
Medication Instructions:  Your physician recommends that you continue on your current medications as directed. Please refer to the Current Medication list given to you today.  If you need a refill on your cardiac medications before your next appointment, please call your pharmacy.   Lab work: None.  If you have labs (blood work) drawn today and your tests are completely normal, you will receive your results only by: Marland Kitchen MyChart Message (if you have MyChart) OR . A paper copy in the mail If you have any lab test that is abnormal or we need to change your treatment, we will call you to review the results.  Testing/Procedures: None.   Follow-Up: At [facility], you and your health needs are our priority.  As part of our continuing mission to provide you with exceptional heart care, we have created designated Provider Care Teams.  These Care Teams include your primary Cardiologist (physician) and Advanced Practice Providers (APPs -  Physician Assistants and Nurse Practitioners) who all work together to provide you with the care you need, when you need it. You will need a follow up appointment in 3 months.  Please call our office 2 months in advance to schedule this appointment.  You may see No primary care provider on file. or another member of our [employer] in [place]: Gypsy Balsam, MD . Norman Herrlich, MD  Any Other Special Instructions Will Be Listed Below (If Applicable).  Dr. Tomie China has referred you to see Dr. Elberta Fortis with electrical physiology they should call you within 1 week if not please call our office.

## 2019-01-07 NOTE — Progress Notes (Signed)
Cardiology Office Note:    Date:  01/07/2019   ID:  Mark Shah, DOB 1956/06/20, MRN [id number]  PCP:  Catha Gosselin, MD  Cardiologist:  Garwin Brothers, MD   Referring MD: Catha Gosselin, MD    ASSESSMENT:    1. Atrial flutter, paroxysmal (HCC)   2. Coronary artery disease involving native coronary artery of native heart without angina pectoris   3. Essential hypertension   4. Hypercholesteremia   5. Mixed dyslipidemia    PLAN:    In order of problems listed above:  1. Secondary prevention stressed with the patient.  Importance of compliance with diet and medication stressed and he vocalized understanding.  His blood pressure is stable.  He is asymptomatic.  His wife accompanies him for this visit. 2. Essentially I agree with his cardiologist's recommendations in [place].  This is a gentleman with known coronary artery disease.  His ejection fraction is mildly abnormal.  He has atrial flutter.  I am trying to obtain records from the hospital.  I do not have the strips where his rhythm can be assessed.  I do not also have the strips where he had pauses but that is what his cardiologist mentioned to me.  Based on this I think he is going to be a candidate for atrial flutter ablation.  I will set him up with our electrophysiology colleagues for this.  Meanwhile I will obtain all those records so that they are available to my colleagues for a detailed evaluation.  He knows to go to the nearest emergency room for any significant concerns. 3. Patient will be seen in follow-up appointment in 3 months or earlier if the patient has any concerns    Medication Adjustments/Labs and Tests Ordered: Current medicines are reviewed at length with the patient today.  Concerns regarding medicines are outlined above.  No orders of the defined types were placed in this encounter.  No orders of the defined types were placed in this encounter.    No chief complaint on file.    History of  Present Illness:    Mark Shah is a 63 y.o. male.  Patient has known coronary artery disease, essential hypertension and dyslipidemia.  He is undergone coronary stenting several months ago.  He went to his brother's funeral in [place] and had palpitations and went to the hospital.  He was found to be in atrial flutter with rapid ventricular rate.  I got a call from his cardiologist up there yesterday mentioning that he had flutter and he had long pauses approximately 6 seconds as far as I remember from the conversation.  The patient never has had a syncopal episode any such problems.  He was initiated on anticoagulation.  Ablation was recommended but he did not want to get it done there and has come back home.  He is here to see me in follow-up.  Again no chest pain orthopnea or PND.  At the time of my evaluation, the patient is alert awake oriented and in no distress.  Past Medical History:  Diagnosis Date  . Adhesive capsulitis    in shoulders  . Diabetes mellitus without complication (HCC)   . Diverticulitis   . Dyslipidemia   . ED (erectile dysfunction)   . HTN (hypertension)   . Hyperlipidemia   . Hypothyroidism   . PONV (postoperative nausea and vomiting)     Past Surgical History:  Procedure Laterality Date  . BACK SURGERY    . CERVICAL DISC SURGERY    .  CORONARY STENT INTERVENTION N/A 05/13/2018   Procedure: CORONARY STENT INTERVENTION;  Surgeon: Marykay Lex, MD;  Location: [facility] INVASIVE CV LAB;  Service: Cardiovascular;  Laterality: N/A;  . left hand    . LEFT HEART CATH AND CORONARY ANGIOGRAPHY N/A 05/13/2018   Procedure: LEFT HEART CATH AND CORONARY ANGIOGRAPHY;  Surgeon: Marykay Lex, MD;  Location[facility] INVASIVE CV LAB;  Service: Cardiovascular;  Laterality: N/A;    Current Medications: Current Meds  Medication Sig  . apixaban (ELIQUIS) 5 MG TABS tablet Take 1 tablet by mouth daily.  Marland Kitchen azelastine (ASTELIN) 0.1 % nasal spray Place 1 spray into both nostrils 2  (two) times daily. Use in each nostril as directed  . Cholecalciferol (VITAMIN D3) 3000 units TABS Take 1 tablet by mouth daily.  . clopidogrel (PLAVIX) 75 MG tablet Take 1 tablet (75 mg total) by mouth daily.  . CONTOUR NEXT TEST test strip   . fluticasone (FLONASE) 50 MCG/ACT nasal spray USE 1 SPRAY IN EACH NOSTRIL DAILY PRN FOR ALLERGIES  . Glucagon HCl (GLUCAGON EMERGENCY) 1 MG SOLR by Injection route Take As Needed.  . Glucagon, rDNA, (GLUCAGON EMERGENCY IJ) Inject 1 mg as directed as needed (as directed for diabetes.Marland Kitchen).  Marland Kitchen insulin glargine (LANTUS) 100 UNIT/ML injection Inject 11 Units into the skin See admin instructions. ONLY IF INSULIN PUMP CAN NOT BE USED   . insulin lispro (HUMALOG) 100 UNIT/ML injection Inject into the skin as directed. As directed with insulin pump. No unites given.  . levothyroxine (SYNTHROID, LEVOTHROID) 200 MCG tablet Take 200 mcg by mouth daily before breakfast.  . loratadine (CLARITIN) 10 MG tablet Take 10 mg by mouth daily as needed for allergies.  . metoprolol tartrate (LOPRESSOR) 25 MG tablet Take 1 tablet by mouth 2 (two) times daily.  . nitroGLYCERIN (NITROSTAT) 0.4 MG SL tablet Place 1 tablet under the tongue every 5 (five) minutes as needed for chest pain.   . rosuvastatin (CRESTOR) 10 MG tablet Take 10 mg by mouth daily.  . sildenafil (VIAGRA) 100 MG tablet Take 50 mg by mouth daily as needed for erectile dysfunction.   Marland Kitchen SYNTHROID 175 MCG tablet Take 175 mcg by mouth daily before breakfast.   . telmisartan (MICARDIS) 40 MG tablet Take 40 mg by mouth daily.  Marland Kitchen zolpidem (AMBIEN) 10 MG tablet Take 5 mg by mouth at bedtime as needed for sleep.      Allergies:   Atorvastatin and Erythromycin   Social History   Socioeconomic History  . Marital status: Married    Spouse name: Not on file  . Number of children: Not on file  . Years of education: Not on file  . Highest education level: Not on file  Occupational History  . Not on file  Social Needs    . Financial resource strain: Not on file  . Food insecurity:    Worry: Not on file    Inability: Not on file  . Transportation needs:    Medical: Not on file    Non-medical: Not on file  Tobacco Use  . Smoking status: Former Games developer  . Smokeless tobacco: Never Used  Substance and Sexual Activity  . Alcohol use: Yes  . Drug use: No  . Sexual activity: Yes  Lifestyle  . Physical activity:    Days per week: Not on file    Minutes per session: Not on file  . Stress: Not on file  Relationships  . Social connections:    Talks on phone:  Not on file    Gets together: Not on file    Attends religious service: Not on file    Active member of club or organization: Not on file    Attends meetings of clubs or organizations: Not on file    Relationship status: Not on file  Other Topics Concern  . Not on file  Social History Narrative  . Not on file     Family History: The patient's family history includes Asthma in his father and mother; COPD in his father; CVA in his paternal grandfather; Cancer - Lung in his maternal grandfather; Hyperlipidemia in his mother and paternal grandfather; Stroke in his paternal grandfather.  ROS:   Please see the history of present illness.    All other systems reviewed and are negative.  EKGs/Labs/Other Studies Reviewed:    The following studies were reviewed today: I discussed my findings with the patient at extensive length.   Recent Labs: 05/13/2018: BUN 6; Creatinine, Ser 1.03; Hemoglobin 12.7; Platelets 224; Potassium 4.1; Sodium 135  Recent Lipid Panel No results found for: CHOL, TRIG, HDL, CHOLHDL, VLDL, LDLCALC, LDLDIRECT  Physical Exam:    VS:  BP (!) 122/58 (BP Location: Right Arm, Patient Position: Sitting, Cuff Size: Normal)   Pulse 63   Ht 5' 10.75" (1.797 m)   Wt 176 lb (79.8 kg)   SpO2 98%   BMI 24.72 kg/m     Wt Readings from Last 3 Encounters:  01/07/19 176 lb (79.8 kg)  10/09/18 174 lb (78.9 kg)  09/21/18 176 lb 6.4 oz  (80 kg)     GEN: Patient is in no acute distress HEENT: Normal NECK: No JVD; No carotid bruits LYMPHATICS: No lymphadenopathy CARDIAC: Hear sounds regular, 2/6 systolic murmur at the apex. RESPIRATORY:  Clear to auscultation without rales, wheezing or rhonchi  ABDOMEN: Soft, non-tender, non-distended MUSCULOSKELETAL:  No edema; No deformity  SKIN: Warm and dry NEUROLOGIC:  Alert and oriented x 3 PSYCHIATRIC:  Normal affect   Signed, Garwin Brothers, MD  01/07/2019 4:18 PM    [facility]

## 2019-01-12 ENCOUNTER — Encounter: Payer: Self-pay | Admitting: *Deleted

## 2019-01-13 ENCOUNTER — Telehealth: Payer: Self-pay | Admitting: Cardiology

## 2019-01-13 ENCOUNTER — Ambulatory Visit: Payer: BLUE CROSS/BLUE SHIELD | Admitting: Cardiology

## 2019-01-13 ENCOUNTER — Encounter: Payer: Self-pay | Admitting: Cardiology

## 2019-01-13 VITALS — BP 118/62 | HR 58 | Ht 70.75 in | Wt 174.0 lb

## 2019-01-13 DIAGNOSIS — I483 Typical atrial flutter: Secondary | ICD-10-CM | POA: Diagnosis not present

## 2019-01-13 NOTE — Telephone Encounter (Signed)
Medical records requested from [facility]. 01/13/19 vlm

## 2019-01-13 NOTE — Progress Notes (Signed)
Electrophysiology Office Note   Date:  01/13/2019   ID:  Mark Shah, DOB Mar 14, 1956, MRN [id number]  PCP:  Mark Gosselin, MD  Cardiologist:  Mark Shah Primary Electrophysiologist:  Talaysha Freeberg [name], MD    No chief complaint on file.    History of Present Illness: Mark Shah is a 63 y.o. male who is being seen today for the evaluation of atrial flutter at the request of Mark Shah, Mark Dubin, MD. Presenting today for electrophysiology evaluation.  History of atrial flutter, coronary artery disease, hypertension, hyperlipidemia.  He went to his brother's funeral and [place].  He complained of palpitations at the time.  He was found to be in atrial flutter with rapid ventricular rates.  He was having long pauses of up to 6 seconds.  He had not had any syncopal events.  He has since been initiated on anticoagulation.  Ablation was recommended in [place], but he declined and wished to come back to [place].   Today, he denies symptoms of palpitations, chest pain, shortness of breath, orthopnea, PND, lower extremity edema, claudication, dizziness, presyncope, syncope, bleeding, or neurologic sequela. The patient is tolerating medications without difficulties.    Past Medical History:  Diagnosis Date  . Adhesive capsulitis    in shoulders  . Diabetes mellitus without complication (HCC)   . Diverticulitis   . Dyslipidemia   . ED (erectile dysfunction)   . HTN (hypertension)   . Hyperlipidemia   . Hypothyroidism   . PONV (postoperative nausea and vomiting)    Past Surgical History:  Procedure Laterality Date  . BACK SURGERY    . CERVICAL DISC SURGERY    . CORONARY STENT INTERVENTION N/A 05/13/2018   Procedure: CORONARY STENT INTERVENTION;  Surgeon: Marykay Lex, MD;  Location: [facility] INVASIVE CV LAB;  Service: Cardiovascular;  Laterality: N/A;  . left hand    . LEFT HEART CATH AND CORONARY ANGIOGRAPHY N/A 05/13/2018   Procedure: LEFT HEART CATH AND CORONARY  ANGIOGRAPHY;  Surgeon: Marykay Lex, MD;  Location: [facility] INVASIVE CV LAB;  Service: Cardiovascular;  Laterality: N/A;     Current Outpatient Medications  Medication Sig Dispense Refill  . apixaban (ELIQUIS) 5 MG TABS tablet Take 1 tablet by mouth daily.    Marland Kitchen azelastine (ASTELIN) 0.1 % nasal spray Place 1 spray into both nostrils 2 (two) times daily. Use in each nostril as directed 30 mL 0  . Cholecalciferol (VITAMIN D3) 3000 units TABS Take 1 tablet by mouth daily.    . clopidogrel (PLAVIX) 75 MG tablet Take 1 tablet (75 mg total) by mouth daily. 30 tablet 0  . CONTOUR NEXT TEST test strip     . fluticasone (FLONASE) 50 MCG/ACT nasal spray USE 1 SPRAY IN EACH NOSTRIL DAILY PRN FOR ALLERGIES  1  . Glucagon HCl (GLUCAGON EMERGENCY) 1 MG SOLR by Injection route Take As Needed.    . Glucagon, rDNA, (GLUCAGON EMERGENCY IJ) Inject 1 mg as directed as needed (as directed for diabetes.Marland Kitchen).    Marland Kitchen insulin glargine (LANTUS) 100 UNIT/ML injection Inject 11 Units into the skin See admin instructions. ONLY IF INSULIN PUMP CAN NOT BE USED     . insulin lispro (HUMALOG) 100 UNIT/ML injection Inject into the skin as directed. As directed with insulin pump. No unites given.    . levothyroxine (SYNTHROID, LEVOTHROID) 200 MCG tablet Take 200 mcg by mouth daily before breakfast.    . loratadine (CLARITIN) 10 MG tablet Take 10 mg by mouth daily as needed for  allergies.    . metoprolol tartrate (LOPRESSOR) 25 MG tablet Take 1 tablet by mouth 2 (two) times daily.    . nitroGLYCERIN (NITROSTAT) 0.4 MG SL tablet Place 1 tablet under the tongue every 5 (five) minutes as needed for chest pain.   0  . rosuvastatin (CRESTOR) 10 MG tablet Take 10 mg by mouth daily.    . sildenafil (VIAGRA) 100 MG tablet Take 50 mg by mouth daily as needed for erectile dysfunction.     Marland Kitchen SYNTHROID 175 MCG tablet Take 175 mcg by mouth daily before breakfast.     . telmisartan (MICARDIS) 40 MG tablet Take 40 mg by mouth daily.    Marland Kitchen zolpidem  (AMBIEN) 10 MG tablet Take 5 mg by mouth at bedtime as needed for sleep.   0   No current facility-administered medications for this visit.     Allergies:   Atorvastatin and Erythromycin   Social History:  The patient  reports that he has quit smoking. He has never used smokeless tobacco. He reports current alcohol use. He reports that he does not use drugs.   Family History:  The patient's family history includes Asthma in his father and mother; COPD in his father; CVA in his paternal grandfather; Heart Problems in his father and paternal grandmother; Hyperlipidemia in his mother and paternal grandfather; Lung cancer in his maternal grandfather; Other in his brother; Stroke in his paternal grandfather.    ROS:  Please see the history of present illness.   Otherwise, review of systems is positive for chest pain, palpitations, back pain, dizziness, easy bruising.   All other systems are reviewed and negative.    PHYSICAL EXAM: VS:  BP 118/62   Pulse (!) 58   Ht 5' 10.75" (1.797 m)   Wt 174 lb (78.9 kg)   BMI 24.44 kg/m  , BMI Body mass index is 24.44 kg/m. GEN: Well nourished, well developed, in no acute distress  HEENT: normal  Neck: no JVD, carotid bruits, or masses Cardiac: RRR; no murmurs, rubs, or gallops,no edema  Respiratory:  clear to auscultation bilaterally, normal work of breathing GI: soft, nontender, nondistended, + BS MS: no deformity or atrophy  Skin: warm and dry Neuro:  Strength and sensation are intact Psych: euthymic mood, full affect  EKG:  EKG is ordered today. Personal review of the ekg ordered shows sinus rhythm, rate 58  Recent Labs: 05/13/2018: BUN 6; Creatinine, Ser 1.03; Hemoglobin 12.7; Platelets 224; Potassium 4.1; Sodium 135    Lipid Panel  No results found for: CHOL, TRIG, HDL, CHOLHDL, VLDL, LDLCALC, LDLDIRECT   Wt Readings from Last 3 Encounters:  01/13/19 174 lb (78.9 kg)  01/07/19 176 lb (79.8 kg)  10/09/18 174 lb (78.9 kg)       Other studies Reviewed: Additional studies/ records that were reviewed today include: LHC 05/13/18  Review of the above records today demonstrates:   CULPRIT LESION: Prox LAD lesion is 95% stenosed.  A drug-eluting stent was successfully placed using a STENT SYNERGY DES 2.75X12. Postdilated to 2.9 mm  Post intervention, there is a 0% residual stenosis.  _____________________________________________________  Lesion #2: Ost 1st Diag lesion is 75% stenosed.  A drug-eluting stent was successfully placed using a STENT SYNERGY DES 2.25X12. Postdilated 2.4 mm  Post intervention, there is a 0% residual stenosis.  ______________________________________________________  Suezanne Jacquet LAD lesion is 25% stenosed -lesion not involved with stent  Mid LAD-1 lesion is 50% stenosed. Mid LAD-2 lesion is 40% stenosed.  Prox Cx  lesion is 30% stenosed.  The left ventricular systolic function is normal. LVEF 55-65% by visual estimate. Normal LVEDP (10 mmHg)    Severe two-vessel disease involving the proximal LAD and proximal 1st Diag branch --> post lesion successfully treated with DES stents.  Preserved LVEF with normal LVEDP  TTE 01/05/19  Left Atrium: Normal left atrial size. Left atrial volume index 22 mLm2.  Right Ventricle: Normal right ventricular size with normal right ventricular global systolic function. Tricuspid  Annular Plane Systolic Excursion (TAPSE) is 2.2 cm. Tricuspid Annular Plane Systolic Velocity  (TAPSV) is 12 cm/s.  Right Atrium: Normal right atrial size. Normal inferior vena cava (IVC)/hepatic vein. IVC collapses >50% with  inspiration consistent with normal venous pressure.  Mitral Valve: Structurally normal mitral valve with minimal mitral regurgitation. No evidence of mitral stenosis.  Aortic Valve: Structurally normal aortic valve without evidence of stenosis or regurgitation.  Tricuspid Valve: Normally structured tricuspid valve. No evidence of tricuspid stenosis. Minimal  tricuspid  regurgitation. Estimated pulmonary artery pressure is 12 mmHg.  Pulmonic Valve: Structurally normal pulmonic valve with trivial regurgitation. No evidence of stenosis.  Aorta: Normal aortic root diameter.  Pericardium: No pericardial effusion.  Masses / Shunts: No masses, shunts or thrombi seen.  ASSESSMENT AND PLAN:  1.  Paroxysmal atrial flutter: There also notes in the chart is reviewed from his prior hospitalization at [facility] atrial fibrillation.  We Glanda Spanbauer work to get results from his ECGs as I do not have those in hand.  He is currently feeling well and is in sinus rhythm.  I did discuss with him the possibilities of ablation versus medical management.  It seems that medical management with Multitak may be has best initial option.  We Ivery Michalski get those results and call him back for further discussions.   This patients CHA2DS2-VASc Score and unadjusted Ischemic Stroke Rate (% per year) is equal to 3.2 % stroke rate/year from a score of 3  Above score calculated as 1 point each if present [CHF, HTN, DM, Vascular=MI/PAD/Aortic Plaque, Age if 65-74, or Male] Above score calculated as 2 points each if present [Age > 75, or Stroke/TIA/TE]    2.  Coronary artery disease: Status post LAD stents.  No current chest pain.  Continue current management.  3.  Hypertension: Currently well controlled  Case discussed with primary cardiology  Current medicines are reviewed at length with the patient today.   The patient does not have concerns regarding his medicines.  The following changes were made today:  none  Labs/ tests ordered today include:  Orders Placed This Encounter  Procedures  . EKG 12-Lead     Disposition:   FU with Leimomi Zervas 3 months  Signed, Maven Varelas [name], MD  01/13/2019 12:42 PM     [facility] [address] [place] [address] [phone] (office) [phone] (fax)

## 2019-01-13 NOTE — Telephone Encounter (Signed)
Medical records received from [facility]. 01/13/19 vlm

## 2019-01-13 NOTE — Patient Instructions (Signed)
Medication Instructions:  Your physician recommends that you continue on your current medications as directed. Please refer to the Current Medication list given to you today.  * If you need a refill on your cardiac medications before your next appointment, please call your pharmacy.   Labwork: None ordered  Testing/Procedures: None ordered  Follow-Up: To be determined  Thank you for choosing [facility]!!   Dory Horn, RN [phone]  Any Other Special Instructions Will Be Listed Below (If Applicable).  We will contact you after Dr. Elberta Fortis has received and reviewed EKGs from [facility]

## 2019-01-22 ENCOUNTER — Telehealth: Payer: Self-pay | Admitting: Cardiology

## 2019-01-22 NOTE — Telephone Encounter (Signed)
New message:    Patient calling concerning some EKG reports that was sent from another doctors office. Patient Dr. Gerre Pebbles where requesting and patient has not heard anything yet. Please call patient.

## 2019-01-22 NOTE — Telephone Encounter (Signed)
I spoke to the patient who was asking if we had received his EKG and whether it was reviewed by Dr Elberta Fortis.  I told him that we would f/u with him on 3/20.  He verbalized understanding.

## 2019-01-23 MED ORDER — DRONEDARONE HCL 400 MG PO TABS: 400.0000 mg | ORAL_TABLET | Freq: Two times a day (BID) | ORAL | 0 refills | Status: DC

## 2019-01-23 MED ORDER — METOPROLOL TARTRATE 25 MG PO TABS: 25.0000 mg | ORAL_TABLET | Freq: Two times a day (BID) | ORAL | 1 refills | Status: DC

## 2019-01-23 MED ORDER — APIXABAN 5 MG PO TABS: 5.0000 mg | ORAL_TABLET | Freq: Every day | ORAL | 1 refills | Status: DC

## 2019-01-23 NOTE — Telephone Encounter (Addendum)
Informed pt Dr. Elberta Fortis reviewed EKG -- AFLutter seen. Discussed that elective procedure are being held at this time d/t COVID-19.  Pt understands and aware I will follow up once procedure can be scheduled. Reports going in and out of rhythm everyday.  Doesn't last more than 5 minutes.  Dr. Elberta Fortis recommends starting Multaq, until we can schedule ablation, for rhythm control.  Pt agreeable and aware I will send a Darrion Macaulay quote request to pharmacy.  Pt will let me know next week if affordable. Refills for Eliquis and Lopressor sent to pharmacy per request.

## 2019-01-28 ENCOUNTER — Telehealth: Payer: Self-pay | Admitting: Pharmacist

## 2019-01-28 MED ORDER — APIXABAN 5 MG PO TABS: 5.0000 mg | ORAL_TABLET | Freq: Two times a day (BID) | ORAL | 1 refills | Status: DC

## 2019-01-28 NOTE — Telephone Encounter (Signed)
Received clarification request from [facility] for Eliquis - RX instructions 5mg  daily. Resent instructions to correct dosage 5mg  BID. Requested that pharmacy cancel order for 5mg  daily.

## 2019-02-04 MED ORDER — DRONEDARONE HCL 400 MG PO TABS: 400.0000 mg | ORAL_TABLET | Freq: Two times a day (BID) | ORAL | 4 refills | Status: DC

## 2019-02-04 NOTE — Telephone Encounter (Signed)
Pt states that med cost him around  $150/90 day supply. He started it last week.  Pt aware I will send in refills for him. Pt aware office will call once able to schedule pts to be seen in office after COVID-19 pandemic is over.

## 2019-02-04 NOTE — Addendum Note (Signed)
Addended by: Baird Lyons on: 02/04/2019 02:56 PM   Modules accepted: Orders

## 2019-02-06 ENCOUNTER — Other Ambulatory Visit: Payer: Self-pay | Admitting: *Deleted

## 2019-02-06 NOTE — Telephone Encounter (Signed)
Patient left a msg on the refill vm requesting a refill on clopidogrel be authorized by Dr Tomie China and [facility]. This has not been filled since 05/13/18 for #30 with 0 refills. Please advise. Thanks, MI

## 2019-02-09 ENCOUNTER — Other Ambulatory Visit: Payer: Self-pay

## 2019-02-09 ENCOUNTER — Telehealth: Payer: Self-pay

## 2019-02-09 MED ORDER — CLOPIDOGREL BISULFATE 75 MG PO TABS: 75.0000 mg | ORAL_TABLET | Freq: Every day | ORAL | 2 refills | Status: DC

## 2019-02-09 MED ORDER — APIXABAN 5 MG PO TABS: 5.0000 mg | ORAL_TABLET | Freq: Two times a day (BID) | ORAL | 1 refills | Status: DC

## 2019-02-09 NOTE — Telephone Encounter (Signed)
ok 

## 2019-02-09 NOTE — Telephone Encounter (Signed)
Verified patients reasoning for both medications. He has been unable to see Dr.Camnitz due to Covid 19, note sent to Dr. Janey Genta to verify.

## 2019-02-09 NOTE — Telephone Encounter (Signed)
Per Dr.RRR patient to remain on clopidigrel and eliquis. Refill for 90 day eliquis [facility].

## 2019-03-05 ENCOUNTER — Other Ambulatory Visit: Payer: Self-pay | Admitting: Cardiology

## 2019-03-05 MED ORDER — DRONEDARONE HCL 400 MG PO TABS: 400.0000 mg | ORAL_TABLET | Freq: Two times a day (BID) | ORAL | 3 refills | Status: DC

## 2019-03-12 DIAGNOSIS — E039 Hypothyroidism, unspecified: Secondary | ICD-10-CM | POA: Diagnosis not present

## 2019-03-12 DIAGNOSIS — Z794 Long term (current) use of insulin: Secondary | ICD-10-CM | POA: Diagnosis not present

## 2019-03-12 DIAGNOSIS — Z23 Encounter for immunization: Secondary | ICD-10-CM | POA: Diagnosis not present

## 2019-03-12 DIAGNOSIS — E109 Type 1 diabetes mellitus without complications: Secondary | ICD-10-CM | POA: Diagnosis not present

## 2019-03-16 DIAGNOSIS — Z794 Long term (current) use of insulin: Secondary | ICD-10-CM | POA: Diagnosis not present

## 2019-03-16 DIAGNOSIS — E109 Type 1 diabetes mellitus without complications: Secondary | ICD-10-CM | POA: Diagnosis not present

## 2019-03-16 DIAGNOSIS — E108 Type 1 diabetes mellitus with unspecified complications: Secondary | ICD-10-CM | POA: Diagnosis not present

## 2019-04-08 ENCOUNTER — Telehealth: Payer: Self-pay | Admitting: Cardiology

## 2019-04-08 NOTE — Telephone Encounter (Signed)
Called patient to confirm that visit for 6/4 will be video or tele and we also need to get Consent. LBW

## 2019-04-08 NOTE — Telephone Encounter (Signed)
New Message           Patient is returning [facility] call would like a call back.

## 2019-04-09 ENCOUNTER — Other Ambulatory Visit: Payer: Self-pay

## 2019-04-09 ENCOUNTER — Encounter: Payer: Self-pay | Admitting: Cardiology

## 2019-04-09 ENCOUNTER — Telehealth: Payer: Self-pay

## 2019-04-09 ENCOUNTER — Telehealth (INDEPENDENT_AMBULATORY_CARE_PROVIDER_SITE_OTHER): Payer: BC Managed Care – PPO | Admitting: Cardiology

## 2019-04-09 VITALS — BP 109/61 | HR 63 | Ht 70.75 in | Wt 172.0 lb

## 2019-04-09 DIAGNOSIS — I10 Essential (primary) hypertension: Secondary | ICD-10-CM

## 2019-04-09 DIAGNOSIS — E782 Mixed hyperlipidemia: Secondary | ICD-10-CM | POA: Diagnosis not present

## 2019-04-09 DIAGNOSIS — E119 Type 2 diabetes mellitus without complications: Secondary | ICD-10-CM

## 2019-04-09 DIAGNOSIS — Z1329 Encounter for screening for other suspected endocrine disorder: Secondary | ICD-10-CM

## 2019-04-09 DIAGNOSIS — I4892 Unspecified atrial flutter: Secondary | ICD-10-CM

## 2019-04-09 DIAGNOSIS — E1069 Type 1 diabetes mellitus with other specified complication: Secondary | ICD-10-CM

## 2019-04-09 DIAGNOSIS — E7800 Pure hypercholesterolemia, unspecified: Secondary | ICD-10-CM

## 2019-04-09 DIAGNOSIS — E785 Hyperlipidemia, unspecified: Secondary | ICD-10-CM

## 2019-04-09 DIAGNOSIS — I2510 Atherosclerotic heart disease of native coronary artery without angina pectoris: Secondary | ICD-10-CM

## 2019-04-09 NOTE — Progress Notes (Signed)
Virtual Visit via Video Note   This visit type was conducted due to national recommendations for restrictions regarding the COVID-19 Pandemic (e.g. social distancing) in an effort to limit this patient's exposure and mitigate transmission in our community.  Due to his co-morbid illnesses, this patient is at least at moderate risk for complications without adequate follow up.  This format is felt to be most appropriate for this patient at this time.  All issues noted in this document were discussed and addressed.  A limited physical exam was performed with this format.  Please refer to the patient's chart for his consent to telehealth for [facility].   Date:  04/09/2019   ID:  Mark AmenJoseph Shah, DOB [birth year], MRN [id number]  Patient Location: Home Provider Location: Home  PCP:  Catha GosselinLittle, Kevin, MD  Cardiologist:  No primary care provider on file.  Electrophysiologist:  None   Evaluation Performed:  Follow-Up Visit  Chief Complaint: Atrial flutter  History of Present Illness:    Mark Shah is a 63 y.o. male with past medical history of essential hypertension, diabetes mellitus and dyslipidemia.  He has atrial flutter with at times significant pauses and ablation was recommended when he was in the hospital in [place].  The patient wanted a second opinion so he came to me.  I reviewed those strips and referred him to our electrophysiologist who concurred with ablation.  Subsequently the patient needed time to think about this and had not made a decision and then the viral pandemic followed.  Patient now thinks that he has made up his mind for the ablation and wants to see our electrophysiology colleague earlier again.  Since then he has had no problems.  He occasionally feels palpitations.  At the time of my evaluation, the patient is alert awake oriented and in no distress.  No dizziness or syncope.  The patient does not have symptoms concerning for COVID-19 infection (fever, chills,  cough, or new shortness of breath).    Past Medical History:  Diagnosis Date  . Adhesive capsulitis    in shoulders  . Diabetes mellitus without complication (HCC)   . Diverticulitis   . Dyslipidemia   . ED (erectile dysfunction)   . HTN (hypertension)   . Hyperlipidemia   . Hypothyroidism   . PONV (postoperative nausea and vomiting)    Past Surgical History:  Procedure Laterality Date  . BACK SURGERY    . CERVICAL DISC SURGERY    . CORONARY STENT INTERVENTION N/A 05/13/2018   Procedure: CORONARY STENT INTERVENTION;  Surgeon: Marykay LexHarding, David W, MD;  [facility];  Service: Cardiovascular;  Laterality: N/A;  . left hand    . LEFT HEART CATH AND CORONARY ANGIOGRAPHY N/A 05/13/2018   Procedure: LEFT HEART CATH AND CORONARY ANGIOGRAPHY;  Surgeon: Marykay LexHarding, David W, MD;  [facility];  Service: Cardiovascular;  Laterality: N/A;     Current Meds  Medication Sig  . apixaban (ELIQUIS) 5 MG TABS tablet Take 1 tablet (5 mg total) by mouth 2 (two) times daily.  Marland Kitchen. azelastine (ASTELIN) 0.1 % nasal spray Place 1 spray into both nostrils 2 (two) times daily. Use in each nostril as directed  . Cholecalciferol (VITAMIN D3) 3000 units TABS Take 1 tablet by mouth daily.  . clopidogrel (PLAVIX) 75 MG tablet Take 1 tablet (75 mg total) by mouth daily.  . CONTOUR NEXT TEST test strip   . dronedarone (MULTAQ) 400 MG tablet Take 1 tablet (400 mg total)  by mouth 2 (two) times daily with a meal.  . fluticasone (FLONASE) 50 MCG/ACT nasal spray USE 1 SPRAY IN EACH NOSTRIL DAILY PRN FOR ALLERGIES  . Glucagon HCl (GLUCAGON EMERGENCY) 1 MG SOLR by Injection route Take As Needed.  . insulin glargine (LANTUS) 100 UNIT/ML injection Inject 11 Units into the skin See admin instructions. ONLY IF INSULIN PUMP CAN NOT BE USED   . insulin lispro (HUMALOG) 100 UNIT/ML injection Inject into the skin as directed. As directed with insulin pump. No unites given.  . levothyroxine (SYNTHROID) 175  MCG tablet Take 175 mcg by mouth daily before breakfast.  . levothyroxine (SYNTHROID) 200 MCG tablet Take 200 mcg by mouth daily before breakfast.  . loratadine (CLARITIN) 10 MG tablet Take 10 mg by mouth daily as needed for allergies.  . metoprolol tartrate (LOPRESSOR) 25 MG tablet Take 1 tablet (25 mg total) by mouth 2 (two) times daily.  . nitroGLYCERIN (NITROSTAT) 0.4 MG SL tablet Place 1 tablet under the tongue every 5 (five) minutes as needed for chest pain.   . rosuvastatin (CRESTOR) 10 MG tablet Take 10 mg by mouth daily.  . sildenafil (VIAGRA) 100 MG tablet Take 50 mg by mouth daily as needed for erectile dysfunction.   Marland Kitchen SYNTHROID 175 MCG tablet Take 175 mcg by mouth daily before breakfast.   . zolpidem (AMBIEN) 10 MG tablet Take 5 mg by mouth at bedtime as needed for sleep.   . [DISCONTINUED] telmisartan (MICARDIS) 40 MG tablet Take 40 mg by mouth daily.     Allergies:   Atorvastatin and Erythromycin   Social History   Tobacco Use  . Smoking status: Former Games developer  . Smokeless tobacco: Never Used  Substance Use Topics  . Alcohol use: Yes  . Drug use: No     Family Hx: The patient's family history includes Asthma in his father and mother; COPD in his father; CVA in his paternal grandfather; Heart Problems in his father and paternal grandmother; Hyperlipidemia in his mother and paternal grandfather; Lung cancer in his maternal grandfather; Other in his brother; Stroke in his paternal grandfather.  ROS:   Please see the history of present illness.    As mentioned above All other systems reviewed and are negative.   Prior CV studies:   The following studies were reviewed today:  I reviewed records from the patient's evaluation in the past  Labs/Other Tests and Data Reviewed:    EKG:  No ECG reviewed.  Recent Labs: 05/13/2018: BUN 6; Creatinine, Ser 1.03; Hemoglobin 12.7; Platelets 224; Potassium 4.1; Sodium 135   Recent Lipid Panel No results found for: CHOL, TRIG,  HDL, CHOLHDL, LDLCALC, LDLDIRECT  Wt Readings from Last 3 Encounters:  04/09/19 172 lb (78 kg)  01/13/19 174 lb (78.9 kg)  01/07/19 176 lb (79.8 kg)     Objective:    Vital Signs:  BP 109/61 (BP Location: Left Arm, Patient Position: Sitting, Cuff Size: Normal)   Pulse 63   Ht 5' 10.75" (1.797 m)   Wt 172 lb (78 kg)   BMI 24.16 kg/m    VITAL SIGNS:  reviewed  ASSESSMENT & PLAN:    1. Atrial flutter:I discussed with the patient atrial flutter-fibrillation, disease process. Management and therapy including rate and rhythm control, anticoagulation benefits and potential risks were discussed extensively with the patient. Patient had multiple questions which were answered to patient's satisfaction. 2. Essential hypertension: Blood pressure is stable.  He is asymptomatic 3. Diabetes mellitus: Diet was discussed and  this is managed by primary care physician 4. Mixed dyslipidemia: Diet was discussed.  I would like to get the patient in tomorrow morning fasting for a Chem-7 and CBC TSH, liver lipid check.  He also needs his medications to be refilled and once we get these numbers we should be able to help him with his refills 5. We will refer him to our electrophysiology colleagues for evaluation and possible ablation for atrial flutter 6. Patient will be seen in follow-up appointment in 3 months or earlier if the patient has any concerns   COVID-19 Education: The signs and symptoms of COVID-19 were discussed with the patient and how to seek care for testing (follow up with PCP or arrange E-visit).  The importance of social distancing was discussed today.  Time:   Today, I have spent 15 minutes with the patient with telehealth technology discussing the above problems.     Medication Adjustments/Labs and Tests Ordered: Current medicines are reviewed at length with the patient today.  Concerns regarding medicines are outlined above.   Tests Ordered: No orders of the defined types were  placed in this encounter.   Medication Changes: No orders of the defined types were placed in this encounter.   Disposition:  Follow up in 3 month(s)  Signed, Garwin Brothers, MD  04/09/2019 9:50 AM    [facility]

## 2019-04-09 NOTE — Telephone Encounter (Signed)
Call patient to review AVS info. Pt states he's busy and cant talk. Informed him to call back to schedule 3 mo f/u.

## 2019-04-09 NOTE — Patient Instructions (Addendum)
Medication Instructions:  Your physician recommends that you continue on your current medications as directed. Please refer to the Current Medication list given to you today.  If you need a refill on your cardiac medications before your next appointment, please call your pharmacy.   Lab work: Your physician recommends that you return FASTING for lab work tomorrow to have a CBC, BMP, lipid, liver and TSH drawn.  If you have labs (blood work) drawn today and your tests are completely normal, you will receive your results only by: Marland Kitchen MyChart Message (if you have MyChart) OR . A paper copy in the mail If you have any lab test that is abnormal or we need to change your treatment, we will call you to review the results.  Testing/Procedures: Your physician has recommended that you have an EP Study. This test is used to assess serious arrhythmias (irregular heartbeats). During an Electro-physiology Study (EPS), a thin, flexible wire is passed through a vein in your groin (upper thigh) or neck up to the heart. The wire records the heart's electrical signals. Your doctor uses the wire to electrically stimulate your heart and trigger an arrhythmic. This allows the doctor to see whether an antiarrhythmia medicine can help manage the problem or if further procedures are necessary (i.e., ablation/ICD). Radiofrequency ablation, a procedure used to fix some types of arrthythmia, may be done during an EPS. This is done in the hospital and often requires an overnight stay. Please see the instruction sheet given to your today for more information.   Follow-Up: At [facility], you and your health needs are our priority.  As part of our continuing mission to provide you with exceptional heart care, we have created designated Provider Care Teams.  These Care Teams include your primary Cardiologist (physician) and Advanced Practice Providers (APPs -  Physician Assistants and Nurse Practitioners) who all work together  to provide you with the care you need, when you need it. You will need a follow up appointment in 3 months.   Any Other Special Instructions Will Be Listed Below   Electrophysiology Study An electrophysiology (EP) study is a heart test in which thin, flexible tubes (catheters) are placed in a large vein in your groin, arm, neck, or chest. This test is done to evaluate the electrical conduction system of your heart. You may need this test if you have:  Dizziness or fainting.  A fast heartbeat (tachycardia).  A slow heartbeat (bradycardia).  An irregular heartbeat (arrhythmia), such as atrial fibrillation. Tell a health care provider about:  Any allergies you have.  All medicines you are taking, including vitamins, herbs, eye drops, creams, and over-the-counter medicines.  Any problems you or family members have had with anesthetic medicines.  Any blood disorders you have.  Any surgeries you have had.  Any medical conditions you have.  Whether you are pregnant or may be pregnant. What are the risks? Generally, this is a safe procedure. However, problems may occur, including:  Tachycardia that does not go away.  Bleeding or bruising around the insertion sites.  Infection.  Temporary or permanent heart rhythm abnormalities.  Temporary changes in blood pressure.  Puncture (perforation) of the heart wall or a blood vessel. This can cause bleeding between the heart and the sac that surrounds it (cardiac tamponade).  Possible cardiac arrest or fatal heart arrhythmia.  Allergic reactions to medicines or dyes.  Damage to other structures or organs. What happens before the procedure? Staying hydrated Follow instructions from your health  care provider about hydration, which may include:  Up to 2 hours before the procedure - you may continue to drink clear liquids, such as water, clear fruit juice, black coffee, and plain tea. Eating and drinking restrictions Follow  instructions from your health care provider about eating and drinking, which may include:  8 hours before the procedure - stop eating heavy meals or foods such as meat, fried foods, or fatty foods.  6 hours before the procedure - stop eating light meals or foods, such as toast or cereal.  6 hours before the procedure - stop drinking milk or drinks that contain milk.  2 hours before the procedure - stop drinking clear liquids. Medicines  Ask your health care provider about: ? Changing or stopping your regular medicines. This is especially important if you are taking diabetes medicines or blood thinners. ? Taking medicines such as aspirin and ibuprofen. These medicines can thin your blood. Do not take these medicines before your procedure if your health care provider instructs you not to.  You may be given antibiotic medicine to help prevent infection. General instructions  Plan to have someone take you home from the hospital or clinic.  If you will be going home right after the procedure, plan to have someone with you for 24 hours.  Ask your health care provider how your surgical site will be marked or identified. What happens during the procedure?  To lower your risk of infection: ? Your health care team will wash or sanitize their hands. ? Your skin will be washed with soap. ? Hair may be removed from the surgical area.  An IV tube will be inserted into one of your veins.  You will be given one or more of the following: ? A medicine to help you relax (sedative). ? A medicine to numb the area (local anesthetic). ? A medicine to make you fall asleep (general anesthetic).  Catheters with an electrode tip will be inserted into a large vein. These electrode tips can measure the heart's electrical activity. They can also use electrical signals to change the heart rhythm.  The catheters will be guided to the heart using a type of X-ray machine (fluoroscopy). Once the catheters are in  the heart, they will evaluate the electrical activity of your heart.  If you are awake during the EP study, you may feel dizzy or light-headed. Your heart rate may temporarily increase or you may feel your heart beating hard. Tell your health care provider if you experience these things during the EP study: ? You feel dizzy or nauseous. ? You have chest pain or pressure.  The catheters will be removed.  Firm pressure will be applied to the insertion sites to prevent bleeding.  A bandage (dressing) may be applied over the insertion sites. The procedure may vary among health care providers and hospitals. What happens after the procedure?   Do not drive for 24 hours if you were given a sedative.  Your blood pressure, heart rate, breathing rate, and blood oxygen level will be monitored until the medicines you were given have worn off.  You will need to lie flat for a few hours or as told by your health care provider. Keep your legs straight. Do not bend or cross your legs. This information is not intended to replace advice given to you by your health care provider. Make sure you discuss any questions you have with your health care provider. Document Released: 04/11/2010 Document Revised: 05/25/2016 Document Reviewed:  04/30/2016 Elsevier Interactive Patient Education  [employer].

## 2019-04-09 NOTE — Addendum Note (Signed)
Addended by: Pamala Hurry on: 04/09/2019 10:54 AM   Modules accepted: Orders

## 2019-05-18 ENCOUNTER — Other Ambulatory Visit: Payer: Self-pay | Admitting: Cardiology

## 2019-05-18 NOTE — Telephone Encounter (Signed)
New Message    *STAT* If patient is at the pharmacy, call can be transferred to refill team.   1. Which medications need to be refilled? (please list name of each medication and dose if known) clopidogrel (PLAVIX) 75 MG tablet   2. Which pharmacy/location (including street and city if local pharmacy) is medication to be sent to? [facility] [place], [place] AT [address]  3. Do they need a 30 day or 90 day supply? [place]

## 2019-05-19 MED ORDER — CLOPIDOGREL BISULFATE 75 MG PO TABS: 75.0000 mg | ORAL_TABLET | Freq: Every day | ORAL | 0 refills | Status: DC

## 2019-05-19 NOTE — Telephone Encounter (Signed)
Rx for clopidogrel sent to pharmacy as requested.

## 2019-05-22 DIAGNOSIS — E108 Type 1 diabetes mellitus with unspecified complications: Secondary | ICD-10-CM | POA: Diagnosis not present

## 2019-05-22 DIAGNOSIS — E109 Type 1 diabetes mellitus without complications: Secondary | ICD-10-CM | POA: Diagnosis not present

## 2019-05-22 DIAGNOSIS — Z794 Long term (current) use of insulin: Secondary | ICD-10-CM | POA: Diagnosis not present

## 2019-06-01 DIAGNOSIS — E782 Mixed hyperlipidemia: Secondary | ICD-10-CM | POA: Diagnosis not present

## 2019-06-01 DIAGNOSIS — E7800 Pure hypercholesterolemia, unspecified: Secondary | ICD-10-CM | POA: Diagnosis not present

## 2019-06-01 DIAGNOSIS — I2510 Atherosclerotic heart disease of native coronary artery without angina pectoris: Secondary | ICD-10-CM | POA: Diagnosis not present

## 2019-06-01 DIAGNOSIS — E785 Hyperlipidemia, unspecified: Secondary | ICD-10-CM | POA: Diagnosis not present

## 2019-06-01 DIAGNOSIS — E1069 Type 1 diabetes mellitus with other specified complication: Secondary | ICD-10-CM | POA: Diagnosis not present

## 2019-06-01 DIAGNOSIS — Z1329 Encounter for screening for other suspected endocrine disorder: Secondary | ICD-10-CM | POA: Diagnosis not present

## 2019-06-01 DIAGNOSIS — I4892 Unspecified atrial flutter: Secondary | ICD-10-CM | POA: Diagnosis not present

## 2019-06-02 LAB — HEPATIC FUNCTION PANEL
ALT: 71 IU/L — ABNORMAL HIGH (ref 0–44)
AST: 48 IU/L — ABNORMAL HIGH (ref 0–40)
Albumin: 4.2 g/dL (ref 3.8–4.8)
Alkaline Phosphatase: 98 IU/L (ref 39–117)
Bilirubin Total: 0.8 mg/dL (ref 0.0–1.2)
Bilirubin, Direct: 0.26 mg/dL (ref 0.00–0.40)
Total Protein: 6.4 g/dL (ref 6.0–8.5)

## 2019-06-02 LAB — BASIC METABOLIC PANEL
BUN/Creatinine Ratio: 8 — ABNORMAL LOW (ref 10–24)
BUN: 10 mg/dL (ref 8–27)
CO2: 22 mmol/L (ref 20–29)
Calcium: 8.6 mg/dL (ref 8.6–10.2)
Chloride: 104 mmol/L (ref 96–106)
Creatinine, Ser: 1.25 mg/dL (ref 0.76–1.27)
GFR calc Af Amer: 70 mL/min/{1.73_m2} (ref >59)
GFR calc non Af Amer: 61 mL/min/{1.73_m2} (ref >59)
Glucose: 112 mg/dL — ABNORMAL HIGH (ref 65–99)
Potassium: 4.2 mmol/L (ref 3.5–5.2)
Sodium: 140 mmol/L (ref 134–144)

## 2019-06-02 LAB — LIPID PANEL
Chol/HDL Ratio: 1.9 ratio (ref 0.0–5.0)
Cholesterol, Total: 132 mg/dL (ref 100–199)
HDL: 71 mg/dL (ref >39)
LDL Calculated: 49 mg/dL (ref 0–99)
Triglycerides: 58 mg/dL (ref 0–149)
VLDL Cholesterol Cal: 12 mg/dL (ref 5–40)

## 2019-06-02 LAB — CBC
Hematocrit: 41.4 % (ref 37.5–51.0)
Hemoglobin: 13.5 g/dL (ref 13.0–17.7)
MCH: 32.8 pg (ref 26.6–33.0)
MCHC: 32.6 g/dL (ref 31.5–35.7)
MCV: 101 fL — ABNORMAL HIGH (ref 79–97)
Platelets: 191 10*3/uL (ref 150–450)
RBC: 4.11 x10E6/uL — ABNORMAL LOW (ref 4.14–5.80)
RDW: 13.3 % (ref 11.6–15.4)
WBC: 4.7 10*3/uL (ref 3.4–10.8)

## 2019-06-02 LAB — TSH: TSH: 1.45 u[IU]/mL (ref 0.450–4.500)

## 2019-06-04 ENCOUNTER — Telehealth: Payer: Self-pay

## 2019-06-04 NOTE — Telephone Encounter (Signed)
Information relayed to patient

## 2019-06-04 NOTE — Telephone Encounter (Signed)
Dr. Eddie Dibbles office made aware of results and patient has upcoming appt on 06/19/19. Copy of notes/labs routed to Dr. Rex Kras and Dr. Curt Bears. Patient was last seen by Dr. Loletha Grayer in March 2020. RN left message for patient to call office for results.

## 2019-06-04 NOTE — Telephone Encounter (Signed)
-----   Message from Jenean Lindau, MD sent at 06/02/2019 10:12 AM EDT ----- As mentioned in notes needs to see EP soon. Needs to see pcp for elevated LFT's. Talk to their nurse and let pt know. [facility] pcp Jenean Lindau, MD 06/02/2019 10:12 AM

## 2019-06-19 DIAGNOSIS — E109 Type 1 diabetes mellitus without complications: Secondary | ICD-10-CM | POA: Diagnosis not present

## 2019-06-19 DIAGNOSIS — I2510 Atherosclerotic heart disease of native coronary artery without angina pectoris: Secondary | ICD-10-CM | POA: Diagnosis not present

## 2019-06-19 DIAGNOSIS — I10 Essential (primary) hypertension: Secondary | ICD-10-CM | POA: Diagnosis not present

## 2019-06-19 DIAGNOSIS — E785 Hyperlipidemia, unspecified: Secondary | ICD-10-CM | POA: Diagnosis not present

## 2019-06-23 ENCOUNTER — Encounter: Payer: Self-pay | Admitting: Cardiology

## 2019-06-23 ENCOUNTER — Other Ambulatory Visit: Payer: Self-pay

## 2019-06-23 ENCOUNTER — Ambulatory Visit (INDEPENDENT_AMBULATORY_CARE_PROVIDER_SITE_OTHER): Payer: BC Managed Care – PPO | Admitting: Cardiology

## 2019-06-23 VITALS — BP 108/68 | HR 133 | Ht 70.75 in | Wt 171.6 lb

## 2019-06-23 DIAGNOSIS — Z794 Long term (current) use of insulin: Secondary | ICD-10-CM | POA: Diagnosis not present

## 2019-06-23 DIAGNOSIS — E039 Hypothyroidism, unspecified: Secondary | ICD-10-CM | POA: Diagnosis not present

## 2019-06-23 DIAGNOSIS — E1165 Type 2 diabetes mellitus with hyperglycemia: Secondary | ICD-10-CM | POA: Diagnosis not present

## 2019-06-23 DIAGNOSIS — I483 Typical atrial flutter: Secondary | ICD-10-CM | POA: Diagnosis not present

## 2019-06-23 DIAGNOSIS — Z125 Encounter for screening for malignant neoplasm of prostate: Secondary | ICD-10-CM | POA: Diagnosis not present

## 2019-06-23 DIAGNOSIS — E109 Type 1 diabetes mellitus without complications: Secondary | ICD-10-CM | POA: Diagnosis not present

## 2019-06-23 DIAGNOSIS — Z131 Encounter for screening for diabetes mellitus: Secondary | ICD-10-CM | POA: Diagnosis not present

## 2019-06-23 MED ORDER — METOPROLOL TARTRATE 50 MG PO TABS: 50.0000 mg | ORAL_TABLET | Freq: Two times a day (BID) | ORAL | 1 refills | Status: DC

## 2019-06-23 NOTE — Patient Instructions (Addendum)
Medication Instructions:  Your physician has recommended you make the following change in your medication:  1. STOP Multaq 2. INCREASE Metoprolol Tartrate (Lopressor) to 50 mg twice a day  * If you need a refill on your cardiac medications before your next appointment, please call your pharmacy.   Labwork: None ordered  Testing/Procedures: Your physician has recommended that you have an ablation. Catheter ablation is a medical procedure used to treat some cardiac arrhythmias (irregular heartbeats). During catheter ablation, a long, thin, flexible tube is put into a blood vessel in your groin (upper thigh), or neck. This tube is called an ablation catheter. It is then guided to your heart through the blood vessel. Radio frequency waves destroy small areas of heart tissue where abnormal heartbeats may cause an arrhythmia to start.  Nurse will call you to schedule this after you return from [place] later next month   Follow-Up: To be determined after procedure is scheduled.  Thank you for choosing [facility]!!   Dory HornSherri Jahdai Padovano, RN [phone]  Any Other Special Instructions Will Be Listed Below (If Applicable).     Electrophysiology/Ablation Procedure Instructions   You are scheduled for a(n) AFlutter ablation on ______ with Dr. Loman BrooklynWill Camnitz.   1.   Pre procedure testing-             A.  LAB WORK --- On ______ you [facility] first [facility] office (see address at the top of this letter) at _______ for your pre procedure blood work.                 B. COVID TEST-- On ________ after you get your lab work- [facility] ([address], [place]) for your Covid testing.   This is a drive thru test site.  There will be multiple testing areas.  Be sure to share with the first checkpoint that you are there for pre-procedure/surgery testing. This will put you into the right (yellow) lane that leads to the PAT testing team. Stay in your car and the nurse team  will come to your car to test you.  After you are tested please go home and self quarantine until the day of your procedure.     2. On the day of your procedure _______ you will go to [facility] [phone]) at _________.  You will go to the main entrance [address] [employer] and enter where the [employer] parking staff are.  Your driver will drop you off and you will head down the hallway to ADMITTING.  You may have one support person come in to the hospital with you.  They will be asked to wait in the waiting room.   3.   Do not eat or drink after midnight prior to your procedure.   4.   Do NOT take any medications the morning of your procedure.   5.  Plan for an overnight stay.  If you use your phone frequently bring your phone charger.   6. You will follow up with the AFIB clinic 4 weeks after your procedure.  You will follow up with Dr. Elberta Fortisamnitz  3 months after your procedure.  These appointments will be made for you.   * If you have ANY questions please call the office [phone] and ask for Gurinder Toral RN or send me a MyChart message   * Occasionally, EP Studies and ablations can become lengthy.  Please make your family  aware of this before your procedure starts.  Average time ranges from 2-8 hours for EP studies/ablations.  Your physician will call your family after the procedure with the results.

## 2019-06-23 NOTE — Progress Notes (Signed)
Electrophysiology Office Note   Date:  06/23/2019   ID:  Mark Shah, DOB 23-Mar-1956, MRN [id number]  PCP:  Catha GosselinLittle, Kevin, MD  Cardiologist:  Revankar Primary Electrophysiologist:  Areatha Kalata Jorja LoaMartin Newell Wafer, MD    No chief complaint on file.    History of Present Illness: Mark Shah is a 63 y.o. male who is being seen today for the evaluation of atrial flutter at the request of Catha GosselinLittle, Kevin, MD. Presenting today for electrophysiology evaluation.  History of atrial flutter, coronary artery disease, hypertension, hyperlipidemia.  He went to his brother's funeral and [place].  He complained of palpitations at the time.  He was found to be in atrial flutter with rapid ventricular rates.  He was having long pauses of up to 6 seconds.  He had not had any syncopal events.  He has since been initiated on anticoagulation.  Ablation was recommended in [place], but he declined and wished to come back to [place].  Today, denies symptoms of palpitations, chest pain, shortness of breath, orthopnea, PND, lower extremity edema, claudication, dizziness, presyncope, syncope, bleeding, or neurologic sequela. The patient is tolerating medications without difficulties.  Overall he is doing well.  He has no chest pain or shortness of breath.  He is currently in atrial flutter, though he is unaware of his arrhythmia.   Past Medical History:  Diagnosis Date   Adhesive capsulitis    in shoulders   Diabetes mellitus without complication (HCC)    Diverticulitis    Dyslipidemia    ED (erectile dysfunction)    HTN (hypertension)    Hyperlipidemia    Hypothyroidism    PONV (postoperative nausea and vomiting)    Past Surgical History:  Procedure Laterality Date   BACK SURGERY     CERVICAL DISC SURGERY     CORONARY STENT INTERVENTION N/A 05/13/2018   Procedure: CORONARY STENT INTERVENTION;  Surgeon: Marykay LexHarding, David W, MD;  [facility];  Service: Cardiovascular;   Laterality: N/A;   left hand     LEFT HEART CATH AND CORONARY ANGIOGRAPHY N/A 05/13/2018   Procedure: LEFT HEART CATH AND CORONARY ANGIOGRAPHY;  Surgeon: Marykay LexHarding, David W, MD;  [facility];  Service: Cardiovascular;  Laterality: N/A;     Current Outpatient Medications  Medication Sig Dispense Refill   apixaban (ELIQUIS) 5 MG TABS tablet Take 1 tablet (5 mg total) by mouth 2 (two) times daily. 180 tablet 1   Cholecalciferol (VITAMIN D3) 3000 units TABS Take 1 tablet by mouth daily.     clopidogrel (PLAVIX) 75 MG tablet Take 1 tablet (75 mg total) by mouth daily. 90 tablet 0   CONTOUR NEXT TEST test strip      dronedarone (MULTAQ) 400 MG tablet Take 1 tablet (400 mg total) by mouth 2 (two) times daily with a meal. 180 tablet 3   fluticasone (FLONASE) 50 MCG/ACT nasal spray USE 1 SPRAY IN EACH NOSTRIL DAILY PRN FOR ALLERGIES  1   Glucagon HCl (GLUCAGON EMERGENCY) 1 MG SOLR by Injection route Take As Needed.     insulin glargine (LANTUS) 100 UNIT/ML injection Inject 11 Units into the skin See admin instructions. ONLY IF INSULIN PUMP CAN NOT BE USED      insulin lispro (HUMALOG) 100 UNIT/ML injection Inject into the skin as directed. As directed with insulin pump. No unites given.     levothyroxine (SYNTHROID) 175 MCG tablet Take 175 mcg by mouth every other day.      levothyroxine (SYNTHROID) 200  MCG tablet Take 200 mcg by mouth every other day.      loratadine (CLARITIN) 10 MG tablet Take 10 mg by mouth daily as needed for allergies.     metoprolol tartrate (LOPRESSOR) 25 MG tablet Take 1 tablet (25 mg total) by mouth 2 (two) times daily. 180 tablet 1   nitroGLYCERIN (NITROSTAT) 0.4 MG SL tablet Place 1 tablet under the tongue every 5 (five) minutes as needed for chest pain.   0   sildenafil (VIAGRA) 100 MG tablet Take 50 mg by mouth daily as needed for erectile dysfunction.      zolpidem (AMBIEN) 10 MG tablet Take 5 mg by mouth at bedtime as needed for sleep.    0   No current facility-administered medications for this visit.     Allergies:   Atorvastatin and Erythromycin   Social History:  The patient  reports that he has quit smoking. He has never used smokeless tobacco. He reports current alcohol use. He reports that he does not use drugs.   Family History:  The patient's family history includes Asthma in his father and mother; COPD in his father; CVA in his paternal grandfather; Heart Problems in his father and paternal grandmother; Hyperlipidemia in his mother and paternal grandfather; Lung cancer in his maternal grandfather; Other in his brother; Stroke in his paternal grandfather.    ROS:  Please see the history of present illness.   Otherwise, review of systems is positive for none.   All other systems are reviewed and negative.   PHYSICAL EXAM: VS:  BP 108/68    Pulse (!) 133    Ht 5' 10.75" (1.797 m)    Wt 171 lb 9.6 oz (77.8 kg)    SpO2 98%    BMI 24.10 kg/m  , BMI Body mass index is 24.1 kg/m. GEN: Well nourished, well developed, in no acute distress  HEENT: normal  Neck: no JVD, carotid bruits, or masses Cardiac: tachycardic, regular; no murmurs, rubs, or gallops,no edema  Respiratory:  clear to auscultation bilaterally, normal work of breathing GI: soft, nontender, nondistended, + BS MS: no deformity or atrophy  Skin: warm and dry Neuro:  Strength and sensation are intact Psych: euthymic mood, full affect  EKG:  EKG is ordered today. Personal review of the ekg ordered shows atrial flutter, rate 133   Recent Labs: 06/01/2019: ALT 71; BUN 10; Creatinine, Ser 1.25; Hemoglobin 13.5; Platelets 191; Potassium 4.2; Sodium 140; TSH 1.450    Lipid Panel     Component Value Date/Time   CHOL 132 06/01/2019 0937   TRIG 58 06/01/2019 0937   HDL 71 06/01/2019 0937   CHOLHDL 1.9 06/01/2019 0937   LDLCALC 49 06/01/2019 0937     Wt Readings from Last 3 Encounters:  06/23/19 171 lb 9.6 oz (77.8 kg)  04/09/19 172 lb (78 kg)    01/13/19 174 lb (78.9 kg)      Other studies Reviewed: Additional studies/ records that were reviewed today include: LHC 05/13/18  Review of the above records today demonstrates:   CULPRIT LESION: Prox LAD lesion is 95% stenosed.  A drug-eluting stent was successfully placed using a STENT SYNERGY DES 2.75X12. Postdilated to 2.9 mm  Post intervention, there is a 0% residual stenosis.  _____________________________________________________  Lesion #2: Ost 1st Diag lesion is 75% stenosed.  A drug-eluting stent was successfully placed using a STENT SYNERGY DES 2.25X12. Postdilated 2.4 mm  Post intervention, there is a 0% residual stenosis.  ______________________________________________________  Colon Flattery LAD  lesion is 25% stenosed -lesion not involved with stent  Mid LAD-1 lesion is 50% stenosed. Mid LAD-2 lesion is 40% stenosed.  Prox Cx lesion is 30% stenosed.  The left ventricular systolic function is normal. LVEF 55-65% by visual estimate. Normal LVEDP (10 mmHg)    Severe two-vessel disease involving the proximal LAD and proximal 1st Diag branch --> post lesion successfully treated with DES stents.  Preserved LVEF with normal LVEDP  TTE 01/05/19  Left Atrium: Normal left atrial size. Left atrial volume index 22 mLm2.  Right Ventricle: Normal right ventricular size with normal right ventricular global systolic function. Tricuspid  Annular Plane Systolic Excursion (TAPSE) is 2.2 cm. Tricuspid Annular Plane Systolic Velocity  (TAPSV) is 12 cm/s.  Right Atrium: Normal right atrial size. Normal inferior vena cava (IVC)/hepatic vein. IVC collapses >50% with  inspiration consistent with normal venous pressure.  Mitral Valve: Structurally normal mitral valve with minimal mitral regurgitation. No evidence of mitral stenosis.  Aortic Valve: Structurally normal aortic valve without evidence of stenosis or regurgitation.  Tricuspid Valve: Normally structured tricuspid valve. No evidence of  tricuspid stenosis. Minimal tricuspid  regurgitation. Estimated pulmonary artery pressure is 12 mmHg.  Pulmonic Valve: Structurally normal pulmonic valve with trivial regurgitation. No evidence of stenosis.  Aorta: Normal aortic root diameter.  Pericardium: No pericardial effusion.  Masses / Shunts: No masses, shunts or thrombi seen.  ASSESSMENT AND PLAN:  1.  Paroxysmal atrial flutter: Currently in atrial flutter today.  He is likely been in atrial flutter for quite some time as he is minimally symptomatic.  His heart rates are in the 130s and thus we Kabir Brannock stop his Multitak and increase his metoprolol to 50 mg twice a day.  I discussed with him possibility of ablation.  Risks and benefits were discussed include bleeding, tamponade, heart block, stroke.  The patient understands these risks and is agreed to the procedure.  This patients CHA2DS2-VASc Score and unadjusted Ischemic Stroke Rate (% per year) is equal to 3.2 % stroke rate/year from a score of 3  Above score calculated as 1 point each if present [CHF, HTN, DM, Vascular=MI/PAD/Aortic Plaque, Age if 65-74, or Male] Above score calculated as 2 points each if present [Age > 75, or Stroke/TIA/TE]    2.  Coronary artery disease: No current chest pain  3.  Hypertension: Currently well controlled    Current medicines are reviewed at length with the patient today.   The patient does not have concerns regarding his medicines.  The following changes were made today: Stop Multitak, increase metoprolol  Labs/ tests ordered today include:  Orders Placed This Encounter  Procedures   EKG 12-Lead     Disposition:   FU with Peregrine Nolt 3 months  Signed, Austine Kelsay Jorja LoaMartin Ethelwyn Gilbertson, MD  06/23/2019 4:47 PM     [facility] [address] [place] [address] [phone] (office) [phone] (fax)

## 2019-07-16 ENCOUNTER — Telehealth: Payer: Self-pay | Admitting: Cardiology

## 2019-07-16 ENCOUNTER — Ambulatory Visit: Payer: BC Managed Care – PPO | Admitting: Cardiology

## 2019-07-16 DIAGNOSIS — I483 Typical atrial flutter: Secondary | ICD-10-CM

## 2019-07-16 DIAGNOSIS — Z01812 Encounter for preprocedural laboratory examination: Secondary | ICD-10-CM

## 2019-07-16 NOTE — Telephone Encounter (Signed)
Lpm that Dr Macky Lower nurse Venida Jarvis) will call him to schedule Ablation. 9/10

## 2019-07-16 NOTE — Telephone Encounter (Signed)
LMTCB

## 2019-07-16 NOTE — Telephone Encounter (Signed)
  Patient is following up to see when his ablation will be scheduled

## 2019-07-17 NOTE — Telephone Encounter (Signed)
Pt would like ablation 10/23. Aware I will send instructions, via MyChart, by next week. We agreed to speak within next several weeks to verbally confirm instructions and schedule COVID screening. Patient verbalized understanding and agreeable to plan.

## 2019-07-21 ENCOUNTER — Telehealth: Payer: BC Managed Care – PPO | Admitting: Cardiology

## 2019-07-28 ENCOUNTER — Ambulatory Visit: Payer: BC Managed Care – PPO | Admitting: Cardiology

## 2019-08-07 NOTE — Addendum Note (Signed)
Addended by: Stanton Kidney on: 08/07/2019 02:19 PM   Modules accepted: Orders

## 2019-08-07 NOTE — Telephone Encounter (Signed)
Reviewed procedure instructions w/ pt. Updated instructions sent via myChart. Pre procedure labs & Covid screening scheduled for 10/20 Post procedure follow up scheduled for 12/1. Patient verbalized understanding and agreeable to plan.

## 2019-08-20 ENCOUNTER — Telehealth: Payer: BC Managed Care – PPO | Admitting: Cardiology

## 2019-08-25 ENCOUNTER — Other Ambulatory Visit: Payer: Self-pay

## 2019-08-25 ENCOUNTER — Other Ambulatory Visit: Payer: BC Managed Care – PPO

## 2019-08-25 ENCOUNTER — Other Ambulatory Visit (HOSPITAL_COMMUNITY)
Admission: RE | Admit: 2019-08-25 | Discharge: 2019-08-25 | Disposition: A | Discharge status: Home / Self Care | Payer: BC Managed Care – PPO | Source: Ambulatory Visit | Attending: Cardiology | Admitting: Cardiology

## 2019-08-25 DIAGNOSIS — Z01812 Encounter for preprocedural laboratory examination: Secondary | ICD-10-CM

## 2019-08-25 DIAGNOSIS — I483 Typical atrial flutter: Secondary | ICD-10-CM

## 2019-08-25 DIAGNOSIS — Z20828 Contact with and (suspected) exposure to other viral communicable diseases: Secondary | ICD-10-CM | POA: Insufficient documentation

## 2019-08-25 DIAGNOSIS — I4892 Unspecified atrial flutter: Secondary | ICD-10-CM | POA: Insufficient documentation

## 2019-08-26 ENCOUNTER — Other Ambulatory Visit: Payer: Self-pay | Admitting: Cardiology

## 2019-08-26 LAB — BASIC METABOLIC PANEL
BUN/Creatinine Ratio: 13 (ref 10–24)
BUN: 14 mg/dL (ref 8–27)
CO2: 23 mmol/L (ref 20–29)
Calcium: 9 mg/dL (ref 8.6–10.2)
Chloride: 102 mmol/L (ref 96–106)
Creatinine, Ser: 1.1 mg/dL (ref 0.76–1.27)
GFR calc Af Amer: 82 mL/min/{1.73_m2} (ref >59)
GFR calc non Af Amer: 71 mL/min/{1.73_m2} (ref >59)
Glucose: 160 mg/dL — ABNORMAL HIGH (ref 65–99)
Potassium: 4.7 mmol/L (ref 3.5–5.2)
Sodium: 138 mmol/L (ref 134–144)

## 2019-08-26 LAB — CBC
Hematocrit: 42.4 % (ref 37.5–51.0)
Hemoglobin: 14 g/dL (ref 13.0–17.7)
MCH: 31.6 pg (ref 26.6–33.0)
MCHC: 33 g/dL (ref 31.5–35.7)
MCV: 96 fL (ref 79–97)
Platelets: 212 10*3/uL (ref 150–450)
RBC: 4.43 x10E6/uL (ref 4.14–5.80)
RDW: 13.1 % (ref 11.6–15.4)
WBC: 5.5 10*3/uL (ref 3.4–10.8)

## 2019-08-26 LAB — NOVEL CORONAVIRUS, NAA (HOSP ORDER, SEND-OUT TO REF LAB; TAT 18-24 HRS): SARS-CoV-2, NAA: NOT DETECTED

## 2019-08-28 ENCOUNTER — Ambulatory Visit (HOSPITAL_COMMUNITY): Payer: BC Managed Care – PPO | Admitting: Certified Registered"

## 2019-08-28 ENCOUNTER — Encounter (HOSPITAL_COMMUNITY)
Admission: RE | Disposition: A | Discharge status: Home / Self Care | Payer: BC Managed Care – PPO | Source: Home / Self Care | Attending: Cardiology

## 2019-08-28 ENCOUNTER — Ambulatory Visit (HOSPITAL_COMMUNITY)
Admission: RE | Admit: 2019-08-28 | Discharge: 2019-08-28 | Disposition: A | Discharge status: Home / Self Care | Payer: BC Managed Care – PPO | Attending: Cardiology | Admitting: Cardiology

## 2019-08-28 ENCOUNTER — Other Ambulatory Visit: Payer: Self-pay

## 2019-08-28 DIAGNOSIS — E785 Hyperlipidemia, unspecified: Secondary | ICD-10-CM | POA: Insufficient documentation

## 2019-08-28 DIAGNOSIS — Z87891 Personal history of nicotine dependence: Secondary | ICD-10-CM | POA: Diagnosis not present

## 2019-08-28 DIAGNOSIS — E039 Hypothyroidism, unspecified: Secondary | ICD-10-CM | POA: Diagnosis not present

## 2019-08-28 DIAGNOSIS — I4892 Unspecified atrial flutter: Secondary | ICD-10-CM | POA: Diagnosis not present

## 2019-08-28 DIAGNOSIS — I2510 Atherosclerotic heart disease of native coronary artery without angina pectoris: Secondary | ICD-10-CM | POA: Insufficient documentation

## 2019-08-28 DIAGNOSIS — Z955 Presence of coronary angioplasty implant and graft: Secondary | ICD-10-CM | POA: Diagnosis not present

## 2019-08-28 DIAGNOSIS — I10 Essential (primary) hypertension: Secondary | ICD-10-CM | POA: Insufficient documentation

## 2019-08-28 DIAGNOSIS — Z881 Allergy status to other antibiotic agents status: Secondary | ICD-10-CM | POA: Insufficient documentation

## 2019-08-28 DIAGNOSIS — I483 Typical atrial flutter: Secondary | ICD-10-CM

## 2019-08-28 DIAGNOSIS — E119 Type 2 diabetes mellitus without complications: Secondary | ICD-10-CM | POA: Insufficient documentation

## 2019-08-28 HISTORY — PX: A-FLUTTER ABLATION: EP1230

## 2019-08-28 LAB — GLUCOSE, CAPILLARY
Glucose-Capillary: 134 mg/dL — ABNORMAL HIGH (ref 70–99)
Glucose-Capillary: 91 mg/dL (ref 70–99)

## 2019-08-28 SURGERY — A-FLUTTER ABLATION
Anesthesia: Monitor Anesthesia Care

## 2019-08-28 MED ORDER — PHENYLEPHRINE 40 MCG/ML (10ML) SYRINGE FOR IV PUSH (FOR BLOOD PRESSURE SUPPORT)
PREFILLED_SYRINGE | INTRAVENOUS | Status: DC | PRN
  Administered 2019-08-28 (×4): 80 ug via INTRAVENOUS

## 2019-08-28 MED ORDER — SODIUM CHLORIDE 0.9 % IV SOLN: INTRAVENOUS | Status: DC

## 2019-08-28 MED ORDER — FENTANYL CITRATE (PF) 100 MCG/2ML IJ SOLN
INTRAMUSCULAR | Status: DC | PRN
  Administered 2019-08-28 (×2): 50 ug via INTRAVENOUS

## 2019-08-28 MED ORDER — PROPOFOL 10 MG/ML IV BOLUS
INTRAVENOUS | Status: DC | PRN
  Administered 2019-08-28: 40 mg via INTRAVENOUS

## 2019-08-28 MED ORDER — ONDANSETRON HCL 4 MG/2ML IJ SOLN
INTRAMUSCULAR | Status: DC | PRN
  Administered 2019-08-28: 4 mg via INTRAVENOUS

## 2019-08-28 MED ORDER — DEXAMETHASONE SODIUM PHOSPHATE 4 MG/ML IJ SOLN
INTRAMUSCULAR | Status: DC | PRN
  Administered 2019-08-28: 4 mg via INTRAVENOUS

## 2019-08-28 MED ORDER — HEPARIN (PORCINE) IN NACL 1000-0.9 UT/500ML-% IV SOLN
INTRAVENOUS | Status: AC
  Filled 2019-08-28: qty 1000

## 2019-08-28 MED ORDER — PROPOFOL 500 MG/50ML IV EMUL
INTRAVENOUS | Status: DC | PRN
  Administered 2019-08-28: 75 ug/kg/min via INTRAVENOUS

## 2019-08-28 MED ORDER — BUPIVACAINE HCL (PF) 0.25 % IJ SOLN
INTRAMUSCULAR | Status: AC
  Filled 2019-08-28: qty 30

## 2019-08-28 MED ORDER — LIDOCAINE 2% (20 MG/ML) 5 ML SYRINGE
INTRAMUSCULAR | Status: DC | PRN
  Administered 2019-08-28: 40 mg via INTRAVENOUS

## 2019-08-28 MED ORDER — BUPIVACAINE HCL (PF) 0.25 % IJ SOLN
INTRAMUSCULAR | Status: DC | PRN
  Administered 2019-08-28: 20 mL

## 2019-08-28 MED ORDER — MIDAZOLAM HCL 5 MG/5ML IJ SOLN
INTRAMUSCULAR | Status: DC | PRN
  Administered 2019-08-28: 2 mg via INTRAVENOUS

## 2019-08-28 MED ORDER — SODIUM CHLORIDE 0.9 % IV SOLN
INTRAVENOUS | Status: DC | PRN
  Administered 2019-08-28: 30 ug/min via INTRAVENOUS

## 2019-08-28 MED ORDER — HEPARIN (PORCINE) IN NACL 1000-0.9 UT/500ML-% IV SOLN
INTRAVENOUS | Status: DC | PRN
  Administered 2019-08-28 (×2): 500 mL

## 2019-08-28 SURGICAL SUPPLY — 11 items
BLANKET WARM UNDERBOD FULL ACC (MISCELLANEOUS) ×1 IMPLANT
CATH EZ STEER NAV 8MM F-J CUR (ABLATOR) ×1 IMPLANT
CATH WEBSTER BI DIR CS D-F CRV (CATHETERS) ×1 IMPLANT
DEVICE CLOSURE PERCLS PRGLD 6F (VASCULAR PRODUCTS) IMPLANT
PACK EP LATEX FREE (CUSTOM PROCEDURE TRAY) ×1
PACK EP LF (CUSTOM PROCEDURE TRAY) ×1 IMPLANT
PAD PRO RADIOLUCENT 2001M-C (PAD) ×2 IMPLANT
PATCH CARTO3 (PAD) ×1 IMPLANT
PERCLOSE PROGLIDE 6F (VASCULAR PRODUCTS) ×8
SHEATH PINNACLE 7F 10CM (SHEATH) ×1 IMPLANT
SHEATH PINNACLE 8F 10CM (SHEATH) ×1 IMPLANT

## 2019-08-28 NOTE — Anesthesia Procedure Notes (Addendum)
Procedure Name: MAC Date/Time: 08/28/2019 12:21 PM Performed by: Orlie Dakin, CRNA Pre-anesthesia Checklist: Patient identified, Emergency Drugs available, Suction available, Patient being monitored and Timeout performed Patient Re-evaluated:Patient Re-evaluated prior to induction Oxygen Delivery Method: Simple face mask Preoxygenation: Pre-oxygenation with 100% oxygen Induction Type: IV induction Placement Confirmation: positive ETCO2

## 2019-08-28 NOTE — Anesthesia Procedure Notes (Deleted)
Performed by: Tremane Spurgeon F, CRNA       

## 2019-08-28 NOTE — Progress Notes (Signed)
No bleeding or swelling noted after ambulation 

## 2019-08-28 NOTE — Anesthesia Preprocedure Evaluation (Addendum)
Anesthesia Evaluation  Patient identified by MRN, date of birth, ID band Patient awake    Reviewed: Allergy & Precautions, NPO status , Patient's Chart, lab work & pertinent test results, reviewed documented beta blocker date and time   History of Anesthesia Complications (+) PONV and history of anesthetic complications  Airway Mallampati: II  TM Distance: >3 FB Neck ROM: Full    Dental  (+) Teeth Intact,    Pulmonary neg shortness of breath, neg sleep apnea, neg COPD, neg recent URI, former smoker,    breath sounds clear to auscultation       Cardiovascular hypertension, Pt. on medications and Pt. on home beta blockers (-) angina+ CAD and + Cardiac Stents  (-) CHF + dysrhythmias Atrial Fibrillation  Rhythm:Irregular   CULPRIT LESION: Prox LAD lesion is 95% stenosed.  A drug-eluting stent was successfully placed using a STENT SYNERGY DES 2.75X12. Postdilated to 2.9 mm  Post intervention, there is a 0% residual stenosis.  _____________________________________________________  Lesion #2: Ost 1st Diag lesion is 75% stenosed.  A drug-eluting stent was successfully placed using a STENT SYNERGY DES 2.25X12. Postdilated 2.4 mm  Post intervention, there is a 0% residual stenosis.  ______________________________________________________  Colon Flattery LAD lesion is 25% stenosed -lesion not involved with stent  Mid LAD-1 lesion is 50% stenosed. Mid LAD-2 lesion is 40% stenosed.  Prox Cx lesion is 30% stenosed.  The left ventricular systolic function is normal. LVEF 55-65% by visual estimate. Normal LVEDP (10 mmHg)    Severe two-vessel disease involving the proximal LAD and proximal 1st Diag branch --> post lesion successfully treated with DES stents.  Preserved LVEF with normal LVEDP   Neuro/Psych negative neurological ROS  negative psych ROS   GI/Hepatic negative GI ROS, Neg liver ROS, Elevate lipid panel on last labs--> statins  held with plans to follow   Endo/Other  diabetes, Insulin DependentHypothyroidism Insulin pump, will leave connected right abdomen on automatic   Renal/GU negative Renal ROS     Musculoskeletal   Abdominal   Peds  Hematology negative hematology ROS (+)   Anesthesia Other Findings   Reproductive/Obstetrics                            Anesthesia Physical Anesthesia Plan  ASA: II  Anesthesia Plan: MAC   Post-op Pain Management:    Induction: Intravenous  PONV Risk Score and Plan: 2 and Treatment may vary due to age or medical condition and Propofol infusion  Airway Management Planned: Nasal Cannula  Additional Equipment: None  Intra-op Plan:   Post-operative Plan:   Informed Consent: I have reviewed the patients History and Physical, chart, labs and discussed the procedure including the risks, benefits and alternatives for the proposed anesthesia with the patient or authorized representative who has indicated his/her understanding and acceptance.     Dental advisory given  Plan Discussed with: CRNA and Surgeon  Anesthesia Plan Comments:         Anesthesia Quick Evaluation

## 2019-08-28 NOTE — Anesthesia Postprocedure Evaluation (Signed)
Anesthesia Post Note  Patient: Mark Shah  Procedure(s) Performed: A-FLUTTER ABLATION (N/A )     Patient location during evaluation: Cath Lab Anesthesia Type: MAC Level of consciousness: awake and alert Pain management: pain level controlled Vital Signs Assessment: post-procedure vital signs reviewed and stable Respiratory status: spontaneous breathing, nonlabored ventilation, respiratory function stable and patient connected to nasal cannula oxygen Cardiovascular status: stable and blood pressure returned to baseline Postop Assessment: no apparent nausea or vomiting Anesthetic complications: no    Last Vitals:  Vitals:   08/28/19 0857 08/28/19 1414  BP: (!) 121/93   Pulse: (!) 140   Resp: 18   Temp: 36.8 C (!) 36.1 C  SpO2: 99%     Last Pain:  Vitals:   08/28/19 1414  TempSrc: Temporal  PainSc: 0-No pain                 Barnet Glasgow

## 2019-08-28 NOTE — Discharge Instructions (Signed)

## 2019-08-28 NOTE — Transfer of Care (Signed)
Immediate Anesthesia Transfer of Care Note  Patient: Mark Shah  Procedure(s) Performed: A-FLUTTER ABLATION (N/A )  Patient Location: Cath Lab  Anesthesia Type:MAC  Level of Consciousness: awake and patient cooperative  Airway & Oxygen Therapy: Patient Spontanous Breathing and Patient connected to nasal cannula oxygen  Post-op Assessment: Report given to RN and Post -op Vital signs reviewed and stable  Post vital signs: Reviewed and stable  Last Vitals:  Vitals Value Taken Time  BP 106/65 08/28/19 1414  Temp 36.1 C 08/28/19 1414  Pulse 80 08/28/19 1417  Resp 17 08/28/19 1417  SpO2 98 % 08/28/19 1417  Vitals shown include unvalidated device data.  Last Pain:  Vitals:   08/28/19 1414  TempSrc: Temporal  PainSc: 0-No pain         Complications: No apparent anesthesia complications

## 2019-08-28 NOTE — Progress Notes (Signed)
Patient has insulin pump.  Patient informed RN that pump has been turned off for procedure

## 2019-08-28 NOTE — H&P (Signed)
Electrophysiology Office Note   Date:  08/28/2019   ID:  Amil AmenJoseph Kassner, DOB 08-09-1956, MRN [id number]  PCP:  Catha GosselinLittle, Kevin, MD  Cardiologist:  Revankar Primary Electrophysiologist:  Arnette Driggs Jorja LoaMartin Janavia Rottman, MD    No chief complaint on file.    History of Present Illness: Amil AmenJoseph Cameron is a 63 y.o. male who is being seen today for the evaluation of atrial flutter at the request of No ref. provider found. Presenting today for electrophysiology evaluation.  History of atrial flutter, coronary artery disease, hypertension, hyperlipidemia.  He went to his brother's funeral and [place].  He complained of palpitations at the time.  He was found to be in atrial flutter with rapid ventricular rates.  He was having long pauses of up to 6 seconds.  He had not had any syncopal events.  He has since been initiated on anticoagulation.  Ablation was recommended in [place], but he declined and wished to come back to [place].  Today, denies symptoms of palpitations, chest pain, shortness of breath, orthopnea, PND, lower extremity edema, claudication, dizziness, presyncope, syncope, bleeding, or neurologic sequela. The patient is tolerating medications without difficulties. Plan for ablation today.    Past Medical History:  Diagnosis Date  . Adhesive capsulitis    in shoulders  . Diabetes mellitus without complication (HCC)   . Diverticulitis   . Dyslipidemia   . ED (erectile dysfunction)   . HTN (hypertension)   . Hyperlipidemia   . Hypothyroidism   . PONV (postoperative nausea and vomiting)    Past Surgical History:  Procedure Laterality Date  . BACK SURGERY    . CERVICAL DISC SURGERY    . CORONARY STENT INTERVENTION N/A 05/13/2018   Procedure: CORONARY STENT INTERVENTION;  Surgeon: Marykay LexHarding, David W, MD;  [facility];  Service: Cardiovascular;  Laterality: N/A;  . left hand    . LEFT HEART CATH AND CORONARY ANGIOGRAPHY N/A 05/13/2018   Procedure: LEFT HEART CATH  AND CORONARY ANGIOGRAPHY;  Surgeon: Marykay LexHarding, David W, MD;  [facility];  Service: Cardiovascular;  Laterality: N/A;     Current Facility-Administered Medications  Medication Dose Route Frequency Provider Last Rate Last Dose  . 0.9 %  sodium chloride infusion   Intravenous Continuous Regan Lemmingamnitz, Ulice Follett Martin, MD 50 mL/hr at 08/28/19 1058      Allergies:   Atorvastatin and Erythromycin   Social History:  The patient  reports that he has quit smoking. He has never used smokeless tobacco. He reports current alcohol use. He reports that he does not use drugs.   Family History:  The patient's family history includes Asthma in his father and mother; COPD in his father; CVA in his paternal grandfather; Heart Problems in his father and paternal grandmother; Hyperlipidemia in his mother and paternal grandfather; Lung cancer in his maternal grandfather; Other in his brother; Stroke in his paternal grandfather.    Vitals:   08/28/19 0857  BP: (!) 121/93  Pulse: (!) 140  Resp: 18  Temp: 98.3 F (36.8 C)  SpO2: 99%   GEN: Well nourished, well developed, in no acute distress  HEENT: normal  Neck: no JVD, carotid bruits, or masses Cardiac: RRR; no murmurs, rubs, or gallops,no edema  Respiratory:  clear to auscultation bilaterally, normal work of breathing GI: soft, nontender, nondistended, + BS MS: no deformity or atrophy  Skin: warm and dry Neuro:  Strength and sensation are intact Psych: euthymic mood, full affect   Recent Labs: 06/01/2019: ALT 71;  TSH 1.450 08/25/2019: BUN 14; Creatinine, Ser 1.10; Hemoglobin 14.0; Platelets 212; Potassium 4.7; Sodium 138    Lipid Panel     Component Value Date/Time   CHOL 132 06/01/2019 0937   TRIG 58 06/01/2019 0937   HDL 71 06/01/2019 0937   CHOLHDL 1.9 06/01/2019 0937   LDLCALC 49 06/01/2019 0937     Wt Readings from Last 3 Encounters:  08/28/19 79.4 kg  06/23/19 77.8 kg  04/09/19 78 kg      Other studies Reviewed:  Additional studies/ records that were reviewed today include: LHC 05/13/18  Review of the above records today demonstrates:   CULPRIT LESION: Prox LAD lesion is 95% stenosed.  A drug-eluting stent was successfully placed using a STENT SYNERGY DES 2.75X12. Postdilated to 2.9 mm  Post intervention, there is a 0% residual stenosis.  _____________________________________________________  Lesion #2: Ost 1st Diag lesion is 75% stenosed.  A drug-eluting stent was successfully placed using a STENT SYNERGY DES 2.25X12. Postdilated 2.4 mm  Post intervention, there is a 0% residual stenosis.  ______________________________________________________  Suezanne Jacquet LAD lesion is 25% stenosed -lesion not involved with stent  Mid LAD-1 lesion is 50% stenosed. Mid LAD-2 lesion is 40% stenosed.  Prox Cx lesion is 30% stenosed.  The left ventricular systolic function is normal. LVEF 55-65% by visual estimate. Normal LVEDP (10 mmHg)    Severe two-vessel disease involving the proximal LAD and proximal 1st Diag branch --> post lesion successfully treated with DES stents.  Preserved LVEF with normal LVEDP  TTE 01/05/19  Left Atrium: Normal left atrial size. Left atrial volume index 22 mLm2.  Right Ventricle: Normal right ventricular size with normal right ventricular global systolic function. Tricuspid  Annular Plane Systolic Excursion (TAPSE) is 2.2 cm. Tricuspid Annular Plane Systolic Velocity  (TAPSV) is 12 cm/s.  Right Atrium: Normal right atrial size. Normal inferior vena cava (IVC)/hepatic vein. IVC collapses >50% with  inspiration consistent with normal venous pressure.  Mitral Valve: Structurally normal mitral valve with minimal mitral regurgitation. No evidence of mitral stenosis.  Aortic Valve: Structurally normal aortic valve without evidence of stenosis or regurgitation.  Tricuspid Valve: Normally structured tricuspid valve. No evidence of tricuspid stenosis. Minimal tricuspid  regurgitation.  Estimated pulmonary artery pressure is 12 mmHg.  Pulmonic Valve: Structurally normal pulmonic valve with trivial regurgitation. No evidence of stenosis.  Aorta: Normal aortic root diameter.  Pericardium: No pericardial effusion.  Masses / Shunts: No masses, shunts or thrombi seen.  ASSESSMENT AND PLAN:  1.  Paroxysmal atrial flutter: plan for ablation  Moshe Wenger has presented today for surgery, with the diagnosis of atrial flutter.  The various methods of treatment have been discussed with the patient and family. After consideration of risks, benefits and other options for treatment, the patient has consented to  Procedure(s): Catheter ablation as a surgical intervention .  Risks include but not limited to bleeding, tamponade, heart block, stroke, damage to surrounding organs, among others. The patient's history has been reviewed, patient examined, no change in status, stable for surgery.  I have reviewed the patient's chart and labs.  Questions were answered to the patient's satisfaction.    Jazma Pickel Elberta Fortis, MD 08/28/2019 11:42 AM   This patients CHA2DS2-VASc Score and unadjusted Ischemic Stroke Rate (% per year) is equal to 3.2 % stroke rate/year from a score of 3  Above score calculated as 1 point each if present [CHF, HTN, DM, Vascular=MI/PAD/Aortic Plaque, Age if 65-74, or Male] Above score calculated as 2 points each if  present [Age > 75, or Stroke/TIA/TE]    2.  Coronary artery disease: No current chest pain  3.  Hypertension: Currently well controlled

## 2019-08-31 ENCOUNTER — Encounter (HOSPITAL_COMMUNITY): Payer: Self-pay | Admitting: Cardiology

## 2019-09-29 DIAGNOSIS — H903 Sensorineural hearing loss, bilateral: Secondary | ICD-10-CM | POA: Diagnosis not present

## 2019-10-02 ENCOUNTER — Other Ambulatory Visit: Payer: Self-pay | Admitting: Cardiology

## 2019-10-02 DIAGNOSIS — E108 Type 1 diabetes mellitus with unspecified complications: Secondary | ICD-10-CM | POA: Diagnosis not present

## 2019-10-02 DIAGNOSIS — E109 Type 1 diabetes mellitus without complications: Secondary | ICD-10-CM | POA: Diagnosis not present

## 2019-10-02 DIAGNOSIS — Z794 Long term (current) use of insulin: Secondary | ICD-10-CM | POA: Diagnosis not present

## 2019-10-05 NOTE — Telephone Encounter (Signed)
Eliquis refill sent to [facility]. Overdue for follow-up appt.

## 2019-10-06 ENCOUNTER — Encounter: Payer: Self-pay | Admitting: Cardiology

## 2019-10-06 ENCOUNTER — Other Ambulatory Visit: Payer: Self-pay

## 2019-10-06 ENCOUNTER — Ambulatory Visit: Payer: BC Managed Care – PPO | Admitting: Cardiology

## 2019-10-06 VITALS — BP 108/72 | HR 72 | Ht 71.0 in | Wt 183.8 lb

## 2019-10-06 DIAGNOSIS — R0989 Other specified symptoms and signs involving the circulatory and respiratory systems: Secondary | ICD-10-CM

## 2019-10-06 DIAGNOSIS — I483 Typical atrial flutter: Secondary | ICD-10-CM

## 2019-10-06 DIAGNOSIS — J343 Hypertrophy of nasal turbinates: Secondary | ICD-10-CM

## 2019-10-06 DIAGNOSIS — J310 Chronic rhinitis: Secondary | ICD-10-CM

## 2019-10-06 DIAGNOSIS — J302 Other seasonal allergic rhinitis: Secondary | ICD-10-CM | POA: Diagnosis not present

## 2019-10-06 DIAGNOSIS — R198 Other specified symptoms and signs involving the digestive system and abdomen: Secondary | ICD-10-CM | POA: Insufficient documentation

## 2019-10-06 DIAGNOSIS — J342 Deviated nasal septum: Secondary | ICD-10-CM | POA: Insufficient documentation

## 2019-10-06 DIAGNOSIS — K219 Gastro-esophageal reflux disease without esophagitis: Secondary | ICD-10-CM | POA: Insufficient documentation

## 2019-10-06 DIAGNOSIS — R09A2 Foreign body sensation, throat: Secondary | ICD-10-CM

## 2019-10-06 HISTORY — DX: Other specified symptoms and signs involving the circulatory and respiratory systems: R09.89

## 2019-10-06 HISTORY — DX: Chronic rhinitis: J31.0

## 2019-10-06 HISTORY — DX: Gastro-esophageal reflux disease without esophagitis: K21.9

## 2019-10-06 HISTORY — DX: Other specified symptoms and signs involving the digestive system and abdomen: R19.8

## 2019-10-06 HISTORY — DX: Foreign body sensation, throat: R09.A2

## 2019-10-06 HISTORY — DX: Hypertrophy of nasal turbinates: J34.3

## 2019-10-06 HISTORY — DX: Deviated nasal septum: J34.2

## 2019-10-06 NOTE — Progress Notes (Signed)
Electrophysiology Office Note   Date:  10/06/2019   ID:  Mark Shah, DOB 01/29/1956, MRN [id number]  PCP:  Hulan Fess, MD  Cardiologist:  Revankar Primary Electrophysiologist:  Vicente Weidler Meredith Leeds, MD    No chief complaint on file.    History of Present Illness: Mark Shah is a 63 y.o. male who is being seen today for the evaluation of atrial flutter at the request of Hulan Fess, MD. Presenting today for electrophysiology evaluation.  History of atrial flutter, coronary artery disease, hypertension, hyperlipidemia.  He went to his brother's funeral and [place].  He complained of palpitations at the time.  He was found to be in atrial flutter with rapid ventricular rates.  He was having long pauses of up to 6 seconds.  He had not had any syncopal events.  He has since been initiated on anticoagulation.  Ablation was recommended in [place], but he declined and wished to come back to [place].  He had an atrial flutter ablation 08/28/2019.  Today, denies symptoms of palpitations, chest pain, shortness of breath, orthopnea, PND, lower extremity edema, claudication, dizziness, presyncope, syncope, bleeding, or neurologic sequela. The patient is tolerating medications without difficulties.  He feels much improved with less shortness of breath and fatigue.  He is able to do all of his daily activities.  He is gone back to exercising.  He has had one episode where he felt palpitations, but they were short-lived.   Past Medical History:  Diagnosis Date  . Adhesive capsulitis    in shoulders  . Diabetes mellitus without complication ([place])   . Diverticulitis   . Dyslipidemia   . ED (erectile dysfunction)   . HTN (hypertension)   . Hyperlipidemia   . Hypothyroidism   . PONV (postoperative nausea and vomiting)    Past Surgical History:  Procedure Laterality Date  . A-FLUTTER ABLATION N/A 08/28/2019   Procedure: A-FLUTTER ABLATION;  Surgeon: Constance Haw,  MD;  Location: [facility];  Service: Cardiovascular;  Laterality: N/A;  . BACK SURGERY    . CERVICAL DISC SURGERY    . CORONARY STENT INTERVENTION N/A 05/13/2018   Procedure: CORONARY STENT INTERVENTION;  Surgeon: Leonie Man, MD;  Location: [facility];  Service: Cardiovascular;  Laterality: N/A;  . left hand    . LEFT HEART CATH AND CORONARY ANGIOGRAPHY N/A 05/13/2018   Procedure: LEFT HEART CATH AND CORONARY ANGIOGRAPHY;  Surgeon: Leonie Man, MD;  Location: [facility];  Service: Cardiovascular;  Laterality: N/A;     Current Outpatient Medications  Medication Sig Dispense Refill  . apixaban (ELIQUIS) 5 MG TABS tablet Take 1 tablet (5 mg total) by mouth 2 (two) times daily. * OFFICE VISIT REQUIRED FOR ADDITIONAL REFILLS* 60 tablet 0  . clopidogrel (PLAVIX) 75 MG tablet TAKE 1 TABLET(75 MG) BY MOUTH DAILY 90 tablet 1  . CONTOUR NEXT TEST test strip     . fluticasone (FLONASE) 50 MCG/ACT nasal spray Place 1 spray into both nostrils daily as needed for allergies.   1  . Glucagon HCl (GLUCAGON EMERGENCY) 1 MG SOLR Inject 1 mg into the skin once as needed.     . insulin glargine (LANTUS) 100 UNIT/ML injection Inject 11 Units into the skin See admin instructions. ONLY IF INSULIN PUMP CAN NOT BE USED  As needed    . insulin lispro (HUMALOG) 100 UNIT/ML injection Inject into the skin as directed. As directed with insulin pump. No unites given.    Marland Kitchen  levothyroxine (SYNTHROID) 175 MCG tablet Take 175 mcg by mouth every other day.     . levothyroxine (SYNTHROID) 200 MCG tablet Take 200 mcg by mouth every other day.     . loratadine (CLARITIN) 10 MG tablet Take 10 mg by mouth daily as needed for allergies.    . nitroGLYCERIN (NITROSTAT) 0.4 MG SL tablet Place 1 tablet under the tongue every 5 (five) minutes as needed for chest pain.   0  . omega-3 acid ethyl esters (LOVAZA) 1 g capsule Take 1 g by mouth daily.    . sildenafil (VIAGRA) 100 MG tablet Take 50 mg by mouth daily  as needed for erectile dysfunction.     Marland Kitchen. zolpidem (AMBIEN) 10 MG tablet Take 5-10 mg by mouth at bedtime as needed for sleep.   0  . metoprolol tartrate (LOPRESSOR) 50 MG tablet Take 1 tablet (50 mg total) by mouth 2 (two) times daily. 90 tablet 1   No current facility-administered medications for this visit.     Allergies:   Atorvastatin and Erythromycin   Social History:  The patient  reports that he has quit smoking. He has never used smokeless tobacco. He reports current alcohol use. He reports that he does not use drugs.   Family History:  The patient's family history includes Asthma in his father and mother; COPD in his father; CVA in his paternal grandfather; Heart Problems in his father and paternal grandmother; Hyperlipidemia in his mother and paternal grandfather; Lung cancer in his maternal grandfather; Other in his brother; Stroke in his paternal grandfather.   ROS:  Please see the history of present illness.   Otherwise, review of systems is positive for none.   All other systems are reviewed and negative.   PHYSICAL EXAM: VS:  BP 108/72   Pulse 72   Ht 5\' 11"  (1.803 m)   Wt 183 lb 12.8 oz (83.4 kg)   SpO2 99%   BMI 25.63 kg/m  , BMI Body mass index is 25.63 kg/m. GEN: Well nourished, well developed, in no acute distress  HEENT: normal  Neck: no JVD, carotid bruits, or masses Cardiac: RRR; no murmurs, rubs, or gallops,no edema  Respiratory:  clear to auscultation bilaterally, normal work of breathing GI: soft, nontender, nondistended, + BS MS: no deformity or atrophy  Skin: warm and dry Neuro:  Strength and sensation are intact Psych: euthymic mood, full affect  EKG:  EKG is ordered today. Personal review of the ekg ordered shows sinus rhythm, incomplete right bundle branch block  Recent Labs: 06/01/2019: ALT 71; TSH 1.450 08/25/2019: BUN 14; Creatinine, Ser 1.10; Hemoglobin 14.0; Platelets 212; Potassium 4.7; Sodium 138    Lipid Panel     Component Value  Date/Time   CHOL 132 06/01/2019 0937   TRIG 58 06/01/2019 0937   HDL 71 06/01/2019 0937   CHOLHDL 1.9 06/01/2019 0937   LDLCALC 49 06/01/2019 0937     Wt Readings from Last 3 Encounters:  10/06/19 183 lb 12.8 oz (83.4 kg)  08/28/19 175 lb (79.4 kg)  06/23/19 171 lb 9.6 oz (77.8 kg)      Other studies Reviewed: Additional studies/ records that were reviewed today include: LHC 05/13/18  Review of the above records today demonstrates:   CULPRIT LESION: Prox LAD lesion is 95% stenosed.  A drug-eluting stent was successfully placed using a STENT SYNERGY DES 2.75X12. Postdilated to 2.9 mm  Post intervention, there is a 0% residual stenosis.  _____________________________________________________  Lesion #2: Suezanne Jacquetst 1st  Diag lesion is 75% stenosed.  A drug-eluting stent was successfully placed using a STENT SYNERGY DES 2.25X12. Postdilated 2.4 mm  Post intervention, there is a 0% residual stenosis.  ______________________________________________________  Suezanne Jacquet LAD lesion is 25% stenosed -lesion not involved with stent  Mid LAD-1 lesion is 50% stenosed. Mid LAD-2 lesion is 40% stenosed.  Prox Cx lesion is 30% stenosed.  The left ventricular systolic function is normal. LVEF 55-65% by visual estimate. Normal LVEDP (10 mmHg)    Severe two-vessel disease involving the proximal LAD and proximal 1st Diag branch --> post lesion successfully treated with DES stents.  Preserved LVEF with normal LVEDP  TTE 01/05/19  Left Atrium: Normal left atrial size. Left atrial volume index 22 mLm2.  Right Ventricle: Normal right ventricular size with normal right ventricular global systolic function. Tricuspid  Annular Plane Systolic Excursion (TAPSE) is 2.2 cm. Tricuspid Annular Plane Systolic Velocity  (TAPSV) is 12 cm/s.  Right Atrium: Normal right atrial size. Normal inferior vena cava (IVC)/hepatic vein. IVC collapses >50% with  inspiration consistent with normal venous pressure.  Mitral Valve:  Structurally normal mitral valve with minimal mitral regurgitation. No evidence of mitral stenosis.  Aortic Valve: Structurally normal aortic valve without evidence of stenosis or regurgitation.  Tricuspid Valve: Normally structured tricuspid valve. No evidence of tricuspid stenosis. Minimal tricuspid  regurgitation. Estimated pulmonary artery pressure is 12 mmHg.  Pulmonic Valve: Structurally normal pulmonic valve with trivial regurgitation. No evidence of stenosis.  Aorta: Normal aortic root diameter.  Pericardium: No pericardial effusion.  Masses / Shunts: No masses, shunts or thrombi seen.  ASSESSMENT AND PLAN:  1.  Paroxysmal atrial flutter: Currently on Eliquis.  Status post ablation 08/28/2019.  As he remains in sinus rhythm, Arnice Vanepps take him off of his Eliquis today.  No other changes.  He did have some palpitations which could be due to atrial fibrillation.  I have told him that if he continues to have them to call me back.  I have also advised him to potentially get a [place].  This patients CHA2DS2-VASc Score and unadjusted Ischemic Stroke Rate (% per year) is equal to 3.2 % stroke rate/year from a score of 3  Above score calculated as 1 point each if present [CHF, HTN, DM, Vascular=MI/PAD/Aortic Plaque, Age if 65-74, or Male] Above score calculated as 2 points each if present [Age > 75, or Stroke/TIA/TE]    2.  Coronary artery disease: No current chest pain  3.  Hypertension: Currently well controlled    Current medicines are reviewed at length with the patient today.   The patient does not have concerns regarding his medicines.  The following changes were made today: Stop Eliquis, stop metoprolol  Labs/ tests ordered today include:  Orders Placed This Encounter  Procedures  . EKG 12-Lead     Disposition:   FU with Ariah Mower as needed months  Signed, Kambrie Eddleman [name], MD  10/06/2019 4:48 PM     [facility] [address] [place]  [place] [address] [phone] (office) [phone] (fax)

## 2019-10-06 NOTE — Addendum Note (Signed)
Addended by: Stanton Kidney on: 10/06/2019 05:25 PM   Modules accepted: Orders

## 2019-10-06 NOTE — Patient Instructions (Signed)
Medication Instructions:  Your physician has recommended you make the following change in your medication:  1. STOP Metoprolol (Lopressor) 2. STOP Eliquis  * If you need a refill on your cardiac medications before your next appointment, please call your pharmacy.   Labwork: None ordered  Testing/Procedures: None ordered  Follow-Up: No follow up is needed at this time with Dr. Curt Bears.  He will see you on an as needed basis.   Thank you for choosing [facility]!!   Trinidad Curet, RN [phone]

## 2019-11-03 DIAGNOSIS — E119 Type 2 diabetes mellitus without complications: Secondary | ICD-10-CM | POA: Diagnosis not present

## 2019-11-03 DIAGNOSIS — H2513 Age-related nuclear cataract, bilateral: Secondary | ICD-10-CM | POA: Diagnosis not present

## 2019-11-25 DIAGNOSIS — G4700 Insomnia, unspecified: Secondary | ICD-10-CM | POA: Diagnosis not present

## 2019-11-25 DIAGNOSIS — F329 Major depressive disorder, single episode, unspecified: Secondary | ICD-10-CM | POA: Diagnosis not present

## 2019-11-29 DIAGNOSIS — H6122 Impacted cerumen, left ear: Secondary | ICD-10-CM | POA: Diagnosis not present

## 2019-12-07 DIAGNOSIS — F4323 Adjustment disorder with mixed anxiety and depressed mood: Secondary | ICD-10-CM | POA: Diagnosis not present

## 2019-12-23 DIAGNOSIS — F331 Major depressive disorder, recurrent, moderate: Secondary | ICD-10-CM | POA: Diagnosis not present

## 2020-01-08 DIAGNOSIS — E109 Type 1 diabetes mellitus without complications: Secondary | ICD-10-CM | POA: Diagnosis not present

## 2020-01-08 DIAGNOSIS — Z5181 Encounter for therapeutic drug level monitoring: Secondary | ICD-10-CM | POA: Diagnosis not present

## 2020-01-08 DIAGNOSIS — E039 Hypothyroidism, unspecified: Secondary | ICD-10-CM | POA: Diagnosis not present

## 2020-01-08 DIAGNOSIS — Z794 Long term (current) use of insulin: Secondary | ICD-10-CM | POA: Diagnosis not present

## 2020-01-08 DIAGNOSIS — E1165 Type 2 diabetes mellitus with hyperglycemia: Secondary | ICD-10-CM | POA: Diagnosis not present

## 2020-01-11 DIAGNOSIS — F3341 Major depressive disorder, recurrent, in partial remission: Secondary | ICD-10-CM | POA: Diagnosis not present

## 2020-01-12 DIAGNOSIS — H9312 Tinnitus, left ear: Secondary | ICD-10-CM

## 2020-01-12 DIAGNOSIS — K219 Gastro-esophageal reflux disease without esophagitis: Secondary | ICD-10-CM | POA: Diagnosis not present

## 2020-01-12 DIAGNOSIS — J342 Deviated nasal septum: Secondary | ICD-10-CM | POA: Diagnosis not present

## 2020-01-12 DIAGNOSIS — J343 Hypertrophy of nasal turbinates: Secondary | ICD-10-CM | POA: Diagnosis not present

## 2020-01-12 DIAGNOSIS — J310 Chronic rhinitis: Secondary | ICD-10-CM | POA: Diagnosis not present

## 2020-01-12 DIAGNOSIS — H9192 Unspecified hearing loss, left ear: Secondary | ICD-10-CM

## 2020-01-12 HISTORY — DX: Unspecified hearing loss, left ear: H91.92

## 2020-01-12 HISTORY — DX: Tinnitus, left ear: H93.12

## 2020-01-20 DIAGNOSIS — E109 Type 1 diabetes mellitus without complications: Secondary | ICD-10-CM | POA: Diagnosis not present

## 2020-01-20 DIAGNOSIS — E108 Type 1 diabetes mellitus with unspecified complications: Secondary | ICD-10-CM | POA: Diagnosis not present

## 2020-01-20 DIAGNOSIS — Z794 Long term (current) use of insulin: Secondary | ICD-10-CM | POA: Diagnosis not present

## 2020-01-26 DIAGNOSIS — J310 Chronic rhinitis: Secondary | ICD-10-CM | POA: Diagnosis not present

## 2020-01-26 DIAGNOSIS — J343 Hypertrophy of nasal turbinates: Secondary | ICD-10-CM | POA: Diagnosis not present

## 2020-01-26 DIAGNOSIS — J342 Deviated nasal septum: Secondary | ICD-10-CM | POA: Diagnosis not present

## 2020-01-26 DIAGNOSIS — H9312 Tinnitus, left ear: Secondary | ICD-10-CM | POA: Diagnosis not present

## 2020-01-26 DIAGNOSIS — H918X2 Other specified hearing loss, left ear: Secondary | ICD-10-CM | POA: Diagnosis not present

## 2020-01-26 DIAGNOSIS — H9042 Sensorineural hearing loss, unilateral, left ear, with unrestricted hearing on the contralateral side: Secondary | ICD-10-CM | POA: Diagnosis not present

## 2020-01-26 DIAGNOSIS — J329 Chronic sinusitis, unspecified: Secondary | ICD-10-CM | POA: Diagnosis not present

## 2020-03-18 ENCOUNTER — Other Ambulatory Visit: Payer: Self-pay | Admitting: Cardiology

## 2020-03-22 DIAGNOSIS — H9312 Tinnitus, left ear: Secondary | ICD-10-CM | POA: Diagnosis not present

## 2020-03-22 DIAGNOSIS — H9041 Sensorineural hearing loss, unilateral, right ear, with unrestricted hearing on the contralateral side: Secondary | ICD-10-CM | POA: Diagnosis not present

## 2020-04-01 DIAGNOSIS — E039 Hypothyroidism, unspecified: Secondary | ICD-10-CM | POA: Diagnosis not present

## 2020-04-01 DIAGNOSIS — E109 Type 1 diabetes mellitus without complications: Secondary | ICD-10-CM | POA: Diagnosis not present

## 2020-04-01 DIAGNOSIS — Z794 Long term (current) use of insulin: Secondary | ICD-10-CM | POA: Diagnosis not present

## 2020-04-20 DIAGNOSIS — E109 Type 1 diabetes mellitus without complications: Secondary | ICD-10-CM | POA: Diagnosis not present

## 2020-04-20 DIAGNOSIS — Z794 Long term (current) use of insulin: Secondary | ICD-10-CM | POA: Diagnosis not present

## 2020-04-20 DIAGNOSIS — E108 Type 1 diabetes mellitus with unspecified complications: Secondary | ICD-10-CM | POA: Diagnosis not present

## 2020-04-26 DIAGNOSIS — Z794 Long term (current) use of insulin: Secondary | ICD-10-CM | POA: Diagnosis not present

## 2020-04-26 DIAGNOSIS — E108 Type 1 diabetes mellitus with unspecified complications: Secondary | ICD-10-CM | POA: Diagnosis not present

## 2020-05-16 DIAGNOSIS — R350 Frequency of micturition: Secondary | ICD-10-CM | POA: Diagnosis not present

## 2020-05-16 DIAGNOSIS — J0100 Acute maxillary sinusitis, unspecified: Secondary | ICD-10-CM | POA: Diagnosis not present

## 2020-05-16 DIAGNOSIS — R509 Fever, unspecified: Secondary | ICD-10-CM | POA: Diagnosis not present

## 2020-05-27 DIAGNOSIS — H9312 Tinnitus, left ear: Secondary | ICD-10-CM | POA: Diagnosis not present

## 2020-05-27 DIAGNOSIS — H9042 Sensorineural hearing loss, unilateral, left ear, with unrestricted hearing on the contralateral side: Secondary | ICD-10-CM | POA: Diagnosis not present

## 2020-05-30 DIAGNOSIS — M25512 Pain in left shoulder: Secondary | ICD-10-CM | POA: Diagnosis not present

## 2020-05-30 DIAGNOSIS — M25511 Pain in right shoulder: Secondary | ICD-10-CM | POA: Diagnosis not present

## 2020-06-02 DIAGNOSIS — M25512 Pain in left shoulder: Secondary | ICD-10-CM | POA: Diagnosis not present

## 2020-06-02 DIAGNOSIS — M25511 Pain in right shoulder: Secondary | ICD-10-CM | POA: Diagnosis not present

## 2020-06-09 DIAGNOSIS — Z20822 Contact with and (suspected) exposure to covid-19: Secondary | ICD-10-CM | POA: Diagnosis not present

## 2020-06-10 DIAGNOSIS — M25512 Pain in left shoulder: Secondary | ICD-10-CM | POA: Diagnosis not present

## 2020-06-10 DIAGNOSIS — M25511 Pain in right shoulder: Secondary | ICD-10-CM | POA: Diagnosis not present

## 2020-06-23 DIAGNOSIS — M25511 Pain in right shoulder: Secondary | ICD-10-CM | POA: Diagnosis not present

## 2020-06-23 DIAGNOSIS — M25512 Pain in left shoulder: Secondary | ICD-10-CM | POA: Diagnosis not present

## 2020-06-29 DIAGNOSIS — M25512 Pain in left shoulder: Secondary | ICD-10-CM | POA: Diagnosis not present

## 2020-06-29 DIAGNOSIS — M25511 Pain in right shoulder: Secondary | ICD-10-CM | POA: Diagnosis not present

## 2020-07-01 DIAGNOSIS — E1165 Type 2 diabetes mellitus with hyperglycemia: Secondary | ICD-10-CM | POA: Diagnosis not present

## 2020-07-01 DIAGNOSIS — E109 Type 1 diabetes mellitus without complications: Secondary | ICD-10-CM | POA: Diagnosis not present

## 2020-07-01 DIAGNOSIS — Z794 Long term (current) use of insulin: Secondary | ICD-10-CM | POA: Diagnosis not present

## 2020-07-01 DIAGNOSIS — E039 Hypothyroidism, unspecified: Secondary | ICD-10-CM | POA: Diagnosis not present

## 2020-07-05 DIAGNOSIS — M25511 Pain in right shoulder: Secondary | ICD-10-CM | POA: Diagnosis not present

## 2020-07-05 DIAGNOSIS — M25512 Pain in left shoulder: Secondary | ICD-10-CM | POA: Diagnosis not present

## 2020-07-06 DIAGNOSIS — Z23 Encounter for immunization: Secondary | ICD-10-CM | POA: Diagnosis not present

## 2020-07-06 DIAGNOSIS — M25511 Pain in right shoulder: Secondary | ICD-10-CM | POA: Diagnosis not present

## 2020-07-07 DIAGNOSIS — M25511 Pain in right shoulder: Secondary | ICD-10-CM | POA: Diagnosis not present

## 2020-07-07 DIAGNOSIS — M25512 Pain in left shoulder: Secondary | ICD-10-CM | POA: Diagnosis not present

## 2020-07-20 DIAGNOSIS — M67912 Unspecified disorder of synovium and tendon, left shoulder: Secondary | ICD-10-CM | POA: Diagnosis not present

## 2020-07-20 DIAGNOSIS — M67911 Unspecified disorder of synovium and tendon, right shoulder: Secondary | ICD-10-CM | POA: Diagnosis not present

## 2020-07-20 DIAGNOSIS — M7502 Adhesive capsulitis of left shoulder: Secondary | ICD-10-CM | POA: Diagnosis not present

## 2020-07-25 DIAGNOSIS — E108 Type 1 diabetes mellitus with unspecified complications: Secondary | ICD-10-CM | POA: Diagnosis not present

## 2020-07-25 DIAGNOSIS — E109 Type 1 diabetes mellitus without complications: Secondary | ICD-10-CM | POA: Diagnosis not present

## 2020-07-25 DIAGNOSIS — Z794 Long term (current) use of insulin: Secondary | ICD-10-CM | POA: Diagnosis not present

## 2020-07-26 DIAGNOSIS — M25512 Pain in left shoulder: Secondary | ICD-10-CM | POA: Diagnosis not present

## 2020-07-26 DIAGNOSIS — M25511 Pain in right shoulder: Secondary | ICD-10-CM | POA: Diagnosis not present

## 2020-08-02 DIAGNOSIS — M25511 Pain in right shoulder: Secondary | ICD-10-CM | POA: Diagnosis not present

## 2020-08-02 DIAGNOSIS — M25512 Pain in left shoulder: Secondary | ICD-10-CM | POA: Diagnosis not present

## 2020-08-03 DIAGNOSIS — Z794 Long term (current) use of insulin: Secondary | ICD-10-CM | POA: Diagnosis not present

## 2020-08-03 DIAGNOSIS — E108 Type 1 diabetes mellitus with unspecified complications: Secondary | ICD-10-CM | POA: Diagnosis not present

## 2020-08-08 DIAGNOSIS — M25511 Pain in right shoulder: Secondary | ICD-10-CM | POA: Diagnosis not present

## 2020-08-08 DIAGNOSIS — M25512 Pain in left shoulder: Secondary | ICD-10-CM | POA: Diagnosis not present

## 2020-08-11 DIAGNOSIS — M25511 Pain in right shoulder: Secondary | ICD-10-CM | POA: Diagnosis not present

## 2020-08-11 DIAGNOSIS — M25512 Pain in left shoulder: Secondary | ICD-10-CM | POA: Diagnosis not present

## 2020-08-15 DIAGNOSIS — M25512 Pain in left shoulder: Secondary | ICD-10-CM | POA: Diagnosis not present

## 2020-08-15 DIAGNOSIS — M25511 Pain in right shoulder: Secondary | ICD-10-CM | POA: Diagnosis not present

## 2020-08-17 DIAGNOSIS — M7502 Adhesive capsulitis of left shoulder: Secondary | ICD-10-CM | POA: Diagnosis not present

## 2020-08-17 DIAGNOSIS — M67911 Unspecified disorder of synovium and tendon, right shoulder: Secondary | ICD-10-CM | POA: Diagnosis not present

## 2020-08-18 DIAGNOSIS — M25512 Pain in left shoulder: Secondary | ICD-10-CM | POA: Diagnosis not present

## 2020-08-18 DIAGNOSIS — M25511 Pain in right shoulder: Secondary | ICD-10-CM | POA: Diagnosis not present

## 2020-08-25 DIAGNOSIS — M25511 Pain in right shoulder: Secondary | ICD-10-CM | POA: Diagnosis not present

## 2020-08-25 DIAGNOSIS — M25512 Pain in left shoulder: Secondary | ICD-10-CM | POA: Diagnosis not present

## 2020-09-07 ENCOUNTER — Other Ambulatory Visit: Payer: Self-pay | Admitting: Cardiology

## 2020-09-26 DIAGNOSIS — E039 Hypothyroidism, unspecified: Secondary | ICD-10-CM | POA: Diagnosis not present

## 2020-09-26 DIAGNOSIS — E108 Type 1 diabetes mellitus with unspecified complications: Secondary | ICD-10-CM | POA: Diagnosis not present

## 2020-09-26 DIAGNOSIS — E1165 Type 2 diabetes mellitus with hyperglycemia: Secondary | ICD-10-CM | POA: Diagnosis not present

## 2020-09-26 DIAGNOSIS — Z794 Long term (current) use of insulin: Secondary | ICD-10-CM | POA: Diagnosis not present

## 2020-09-26 DIAGNOSIS — E109 Type 1 diabetes mellitus without complications: Secondary | ICD-10-CM | POA: Diagnosis not present

## 2020-09-28 DIAGNOSIS — M7502 Adhesive capsulitis of left shoulder: Secondary | ICD-10-CM | POA: Diagnosis not present

## 2020-09-28 DIAGNOSIS — M67912 Unspecified disorder of synovium and tendon, left shoulder: Secondary | ICD-10-CM | POA: Diagnosis not present

## 2020-10-07 DIAGNOSIS — Z20822 Contact with and (suspected) exposure to covid-19: Secondary | ICD-10-CM | POA: Diagnosis not present

## 2020-10-07 DIAGNOSIS — Z20828 Contact with and (suspected) exposure to other viral communicable diseases: Secondary | ICD-10-CM | POA: Diagnosis not present

## 2020-10-18 DIAGNOSIS — M25511 Pain in right shoulder: Secondary | ICD-10-CM | POA: Diagnosis not present

## 2020-10-28 DIAGNOSIS — E108 Type 1 diabetes mellitus with unspecified complications: Secondary | ICD-10-CM | POA: Diagnosis not present

## 2020-10-28 DIAGNOSIS — Z794 Long term (current) use of insulin: Secondary | ICD-10-CM | POA: Diagnosis not present

## 2020-11-02 DIAGNOSIS — M67912 Unspecified disorder of synovium and tendon, left shoulder: Secondary | ICD-10-CM | POA: Diagnosis not present

## 2020-11-30 ENCOUNTER — Ambulatory Visit: Payer: BC Managed Care – PPO | Admitting: Cardiology

## 2020-12-27 ENCOUNTER — Other Ambulatory Visit: Payer: Self-pay

## 2020-12-27 DIAGNOSIS — E039 Hypothyroidism, unspecified: Secondary | ICD-10-CM | POA: Insufficient documentation

## 2020-12-27 DIAGNOSIS — R112 Nausea with vomiting, unspecified: Secondary | ICD-10-CM | POA: Insufficient documentation

## 2020-12-27 DIAGNOSIS — E785 Hyperlipidemia, unspecified: Secondary | ICD-10-CM | POA: Insufficient documentation

## 2020-12-27 DIAGNOSIS — N529 Male erectile dysfunction, unspecified: Secondary | ICD-10-CM | POA: Insufficient documentation

## 2020-12-27 DIAGNOSIS — E119 Type 2 diabetes mellitus without complications: Secondary | ICD-10-CM | POA: Insufficient documentation

## 2020-12-27 DIAGNOSIS — I10 Essential (primary) hypertension: Secondary | ICD-10-CM | POA: Insufficient documentation

## 2020-12-27 DIAGNOSIS — Z9889 Other specified postprocedural states: Secondary | ICD-10-CM | POA: Insufficient documentation

## 2020-12-27 DIAGNOSIS — M7500 Adhesive capsulitis of unspecified shoulder: Secondary | ICD-10-CM | POA: Insufficient documentation

## 2020-12-29 ENCOUNTER — Other Ambulatory Visit: Payer: Self-pay

## 2020-12-29 ENCOUNTER — Ambulatory Visit (INDEPENDENT_AMBULATORY_CARE_PROVIDER_SITE_OTHER): Payer: Medicare Other | Admitting: Cardiology

## 2020-12-29 ENCOUNTER — Encounter: Payer: Self-pay | Admitting: Cardiology

## 2020-12-29 VITALS — BP 126/72 | HR 88 | Ht 70.0 in | Wt 180.1 lb

## 2020-12-29 DIAGNOSIS — I2510 Atherosclerotic heart disease of native coronary artery without angina pectoris: Secondary | ICD-10-CM

## 2020-12-29 DIAGNOSIS — E119 Type 2 diabetes mellitus without complications: Secondary | ICD-10-CM | POA: Diagnosis not present

## 2020-12-29 DIAGNOSIS — I4892 Unspecified atrial flutter: Secondary | ICD-10-CM | POA: Diagnosis not present

## 2020-12-29 DIAGNOSIS — R7989 Other specified abnormal findings of blood chemistry: Secondary | ICD-10-CM

## 2020-12-29 DIAGNOSIS — I10 Essential (primary) hypertension: Secondary | ICD-10-CM

## 2020-12-29 DIAGNOSIS — E785 Hyperlipidemia, unspecified: Secondary | ICD-10-CM

## 2020-12-29 DIAGNOSIS — I209 Angina pectoris, unspecified: Secondary | ICD-10-CM

## 2020-12-29 DIAGNOSIS — E782 Mixed hyperlipidemia: Secondary | ICD-10-CM

## 2020-12-29 MED ORDER — RANOLAZINE ER 1000 MG PO TB12: 1000.0000 mg | ORAL_TABLET | Freq: Two times a day (BID) | ORAL | 0 refills | Status: DC

## 2020-12-29 MED ORDER — RANOLAZINE ER 1000 MG PO TB12: ORAL_TABLET | ORAL | 1 refills | Status: DC

## 2020-12-29 NOTE — Addendum Note (Signed)
Addended by: Leamon Palau, Elmarie Shiley L on: 12/29/2020 01:36 PM   Modules accepted: Orders

## 2020-12-29 NOTE — Patient Instructions (Signed)
Medication Instructions:  Your physician has recommended you make the following change in your medication:  START: Ranexa 500 mg twice daily for 2 weeks then 1000 mg twice daily  *If you need a refill on your cardiac medications before your next appointment, please call your pharmacy*   Lab Work: Your physician recommends that you return for lab work today: bmp, lft, lipids, hemoglobin a1c  If you have labs (blood work) drawn today and your tests are completely normal, you will receive your results only by: Marland Kitchen MyChart Message (if you have MyChart) OR . A paper copy in the mail If you have any lab test that is abnormal or we need to change your treatment, we will call you to review the results.   Testing/Procedures:   [facility] Cardiovascular Imaging at [facility] [address] [place], [place] [address] Phone: [phone]    Please arrive 15 minutes prior to your appointment time for registration and insurance purposes.  The test will take approximately 3 to 4 hours to complete; you may bring reading material.  If someone comes with you to your appointment, they will need to remain in the main lobby due to limited space in the testing area. **If you are pregnant or breastfeeding, please notify the nuclear lab prior to your appointment**  How to prepare for your Myocardial Perfusion Test: . Do not eat or drink 3 hours prior to your test, except you may have water. . Do not consume products containing caffeine (regular or decaffeinated) 12 hours prior to your test. (ex: coffee, chocolate, sodas, tea). . Do bring a list of your current medications with you.  If not listed below, you may take your medications as normal.  . Do wear comfortable clothes (no dresses or overalls) and walking shoes, tennis shoes preferred (No heels or open toe shoes are allowed). . Do NOT wear cologne, perfume, aftershave, or lotions (deodorant is allowed). . If these instructions are  not followed, your test will have to be rescheduled.  Please report to [address], [address] for your test.  If you have questions or concerns about your appointment, you can call the Nuclear Lab at [phone].  If you cannot keep your appointment, please provide 24 hours notification to the Nuclear Lab, to avoid a possible $50 charge to your account.    Follow-Up: At [facility], you and your health needs are our priority.  As part of our continuing mission to provide you with exceptional heart care, we have created designated Provider Care Teams.  These Care Teams include your primary Cardiologist (physician) and Advanced Practice Providers (APPs -  Physician Assistants and Nurse Practitioners) who all work together to provide you with the care you need, when you need it.  We recommend signing up for the patient portal called "MyChart".  Sign up information is provided on this After Visit Summary.  MyChart is used to connect with patients for Virtual Visits (Telemedicine).  Patients are able to view lab/test results, encounter notes, upcoming appointments, etc.  Non-urgent messages can be sent to your provider as well.   To learn more about what you can do with MyChart, go to [email or web address].    Your next appointment:   1 month(s)  The format for your next appointment:   In Person  Provider:   Belva Crome, MD   Other Instructions   Cardiac Nuclear Scan A cardiac nuclear scan is a test that measures blood flow to the heart  when a person is resting and when he or she is exercising. The test looks for problems such as:  Not enough blood reaching a portion of the heart.  The heart muscle not working normally. You may need this test if:  You have heart disease.  You have had abnormal lab results.  You have had heart surgery or a balloon procedure to open up blocked arteries (angioplasty).  You have chest pain.  You have shortness of breath. In this  test, a radioactive dye (tracer) is injected into your bloodstream. After the tracer has traveled to your heart, an imaging device is used to measure how much of the tracer is absorbed by or distributed to various areas of your heart. This procedure is usually done at a hospital and takes 2-4 hours. Tell a health care provider about:  Any allergies you have.  All medicines you are taking, including vitamins, herbs, eye drops, creams, and over-the-counter medicines.  Any problems you or family members have had with anesthetic medicines.  Any blood disorders you have.  Any surgeries you have had.  Any medical conditions you have.  Whether you are pregnant or may be pregnant. What are the risks? Generally, this is a safe procedure. However, problems may occur, including:  Serious chest pain and heart attack. This is only a risk if the stress portion of the test is done.  Rapid heartbeat.  Sensation of warmth in your chest. This usually passes quickly.  Allergic reaction to the tracer. What happens before the procedure?  Ask your health care provider about changing or stopping your regular medicines. This is especially important if you are taking diabetes medicines or blood thinners.  Follow instructions from your health care provider about eating or drinking restrictions.  Remove your jewelry on the day of the procedure. What happens during the procedure?  An IV will be inserted into one of your veins.  Your health care provider will inject a small amount of radioactive tracer through the IV.  You will wait for 20-40 minutes while the tracer travels through your bloodstream.  Your heart activity will be monitored with an electrocardiogram (ECG).  You will lie down on an exam table.  Images of your heart will be taken for about 15-20 minutes.  You may also have a stress test. For this test, one of the following may be done: ? You will exercise on a treadmill or stationary  bike. While you exercise, your heart's activity will be monitored with an ECG, and your blood pressure will be checked. ? You will be given medicines that will increase blood flow to parts of your heart. This is done if you are unable to exercise.  When blood flow to your heart has peaked, a tracer will again be injected through the IV.  After 20-40 minutes, you will get back on the exam table and have more images taken of your heart.  Depending on the type of tracer used, scans may need to be repeated 3-4 hours later.  Your IV line will be removed when the procedure is over. The procedure may vary among health care providers and hospitals. What happens after the procedure?  Unless your health care provider tells you otherwise, you may return to your normal schedule, including diet, activities, and medicines.  Unless your health care provider tells you otherwise, you may increase your fluid intake. This will help to flush the contrast dye from your body. Drink enough fluid to keep your urine pale  yellow.  Ask your health care provider, or the department that is doing the test: ? When will my results be ready? ? How will I get my results? Summary  A cardiac nuclear scan measures the blood flow to the heart when a person is resting and when he or she is exercising.  Tell your health care provider if you are pregnant.  Before the procedure, ask your health care provider about changing or stopping your regular medicines. This is especially important if you are taking diabetes medicines or blood thinners.  After the procedure, unless your health care provider tells you otherwise, increase your fluid intake. This will help flush the contrast dye from your body.  After the procedure, unless your health care provider tells you otherwise, you may return to your normal schedule, including diet, activities, and medicines. This information is not intended to replace advice given to you by your  health care provider. Make sure you discuss any questions you have with your health care provider. Document Revised: 04/07/2018 Document Reviewed: 04/07/2018 Elsevier Patient Education  2021 Elsevier Inc.   Ranolazine tablets, extended release What is this medicine? RANOLAZINE (ra NOE la zeen) is a heart medicine. It is used to treat chronic chest pain (angina). This medicine must be taken regularly. It will not relieve an acute episode of chest pain. This medicine may be used for other purposes; ask your health care provider or pharmacist if you have questions. COMMON BRAND NAME(S): Ranexa What should I tell my health care provider before I take this medicine? They need to know if you have any of these conditions:  heart disease  irregular heartbeat  kidney disease  liver disease  low levels of potassium or magnesium in the blood  an unusual or allergic reaction to ranolazine, other medicines, foods, dyes, or preservatives  pregnant or trying to get pregnant  breast-feeding How should I use this medicine? Take this medicine by mouth with a glass of water. Follow the directions on the prescription label. Do not cut, crush, or chew this medicine. Take with or without food. Do not take this medication with grapefruit juice. Take your doses at regular intervals. Do not take your medicine more often then directed. Talk to your pediatrician regarding the use of this medicine in children. Special care may be needed. Overdosage: If you think you have taken too much of this medicine contact a poison control center or emergency room at once. NOTE: This medicine is only for you. Do not share this medicine with others. What if I miss a dose? If you miss a dose, take it as soon as you can. If it is almost time for your next dose, take only that dose. Do not take double or extra doses. What may interact with this medicine? Do not take this medicine with any of the following  medications:  antivirals for HIV or AIDS  cerivastatin  certain antibiotics like chloramphenicol, clarithromycin, dalfopristin; quinupristin, isoniazid, rifabutin, rifampin, rifapentine  certain medicines used for cancer like imatinib, nilotinib  certain medicines for fungal infections like fluconazole, itraconazole, ketoconazole, posaconazole, voriconazole  certain medicines for irregular heart beat like dronedarone  certain medicines for seizures like carbamazepine, fosphenytoin, oxcarbazepine, phenobarbital, phenytoin  cisapride  conivaptan  cyclosporine  grapefruit or grapefruit juice  lumacaftor; ivacaftor  nefazodone  pimozide  quinacrine  St John's wort  thioridazine This medicine may also interact with the following medications:  alfuzosin  certain medicines for depression, anxiety, or psychotic disturbances like bupropion, citalopram,  fluoxetine, fluphenazine, paroxetine, perphenazine, risperidone, sertraline, trifluoperazine  certain medicines for cholesterol like atorvastatin, lovastatin, simvastatin  certain medicines for stomach problems like octreotide, palonosetron, prochlorperazine  eplerenone  ergot alkaloids like dihydroergotamine, ergonovine, ergotamine, methylergonovine  metformin  nicardipine  other medicines that prolong the QT interval (cause an abnormal heart rhythm) like dofetilide, ziprasidone  sirolimus  tacrolimus This list may not describe all possible interactions. Give your health care provider a list of all the medicines, herbs, non-prescription drugs, or dietary supplements you use. Also tell them if you smoke, drink alcohol, or use illegal drugs. Some items may interact with your medicine. What should I watch for while using this medicine? Visit your doctor for regular check ups. Tell your doctor or healthcare professional if your symptoms do not start to get better or if they get worse. This medicine will not relieve an  acute attack of angina or chest pain. This medicine can change your heart rhythm. Your health care provider may check your heart rhythm by ordering an electrocardiogram (ECG) while you are taking this medicine. You may get drowsy or dizzy. Do not drive, use machinery, or do anything that needs mental alertness until you know how this medicine affects you. Do not stand or sit up quickly, especially if you are an older patient. This reduces the risk of dizzy or fainting spells. Alcohol may interfere with the effect of this medicine. Avoid alcoholic drinks. If you are scheduled for any medical or dental procedure, tell your healthcare provider that you are taking this medicine. This medicine can interact with other medicines used during surgery. What side effects may I notice from receiving this medicine? Side effects that you should report to your doctor or health care professional as soon as possible:  allergic reactions like skin rash, itching or hives, swelling of the face, lips, or tongue  breathing problems  changes in vision  fast, irregular or pounding heartbeat  feeling faint or lightheaded, falls  low or high blood pressure  numbness or tingling feelings  ringing in the ears  tremor or shakiness  slow heartbeat (fewer than 50 beats per minute)  swelling of the legs or feet Side effects that usually do not require medical attention (report to your doctor or health care professional if they continue or are bothersome):  constipation  drowsy  dry mouth  headache  nausea or vomiting  stomach upset This list may not describe all possible side effects. Call your doctor for medical advice about side effects. You may report side effects to FDA at 1-800-FDA-1088. Where should I keep my medicine? Keep out of the reach of children. Store at room temperature between 15 and 30 degrees C (59 and 86 degrees F). Throw away any unused medicine after the expiration date. NOTE: This  sheet is a summary. It may not cover all possible information. If you have questions about this medicine, talk to your doctor, pharmacist, or health care provider.  2021 Elsevier/Gold Standard (2018-10-14 09:18:49)

## 2020-12-29 NOTE — Progress Notes (Signed)
Cardiology Office Note:    Date:  12/29/2020   ID:  Dock Baccam, DOB [birth year], MRN [id number]  PCP:  Catha Gosselin, MD  Cardiologist:  Garwin Brothers, MD   Referring MD: Catha Gosselin, MD    ASSESSMENT:    1. Coronary artery disease involving native coronary artery of native heart without angina pectoris   2. Essential hypertension   3. Atrial flutter, paroxysmal (HCC)   4. Diabetes mellitus without complication (HCC)   5. Dyslipidemia   6. Mixed dyslipidemia    PLAN:    In order of problems listed above:  1. Angina pectoris: Stable: Coronary artery disease: Secondary prevention stressed with the patient.  Importance of compliance with diet medication stressed and vocalized understanding.  He has good effort tolerance.  I told him to walk on a regular basis he is 2 miles without taking a break on a daily basis.  I will initiate Ranexa 500 mg twice daily for 2 weeks then 1 g twice daily after that.  He will be seen in follow-up appointment in a month or earlier if he has any concerns.  To reassure him I will do an exercise stress Cardiolite.  He knows to go to the nearest emergency room for any concerns.  He was given option for invasive coronary angiography and he declined and I respect his wishes.  Sublingual nitroglycerin prescription was sent, its protocol and 911 protocol explained and the patient vocalized understanding questions were answered to the patient's satisfaction.  He knows to go to the nearest emergency room for any concerning symptoms. 2. Essential hypertension: Blood pressure stable and diet was emphasized. 3. Mixed dyslipidemia: Diet emphasized.  He is fasting and will have blood work today.  His last lipids were significantly elevated and emphasized diet to him.  He promises to comply and do better. 4. Diabetes mellitus: Diet emphasized.  This is managed by primary care provider. 5. Patient will be seen in follow-up appointment in 6 months or earlier if the  patient has any concerns    Medication Adjustments/Labs and Tests Ordered: Current medicines are reviewed at length with the patient today.  Concerns regarding medicines are outlined above.  No orders of the defined types were placed in this encounter.  No orders of the defined types were placed in this encounter.    No chief complaint on file.    History of Present Illness:    Mark Shah is a 65 y.o. male.  Patient has past medical history of coronary artery disease post stenting, atrial flutter post ablation, essential hypertension dyslipidemia and diabetes mellitus.  He denies any problems at this time and takes care of activities of daily living.  He walks 2 miles on a regular basis without any symptoms but he when he goes uphill he feels tightness in the chest.  No radiation to the neck or to the arms.  He is here for evaluation of the symptoms.  At the time of my evaluation, the patient is alert awake oriented and in no distress.  Past Medical History:  Diagnosis Date  . Adhesive capsulitis    in shoulders  . Atrial fibrillation with RVR (HCC) 01/03/2019  . Atrial flutter, paroxysmal (HCC) 01/07/2019  . CAD (coronary artery disease), native coronary artery 05/14/2018   Cath 05/13/18 LM normal, ostial LAD 25%, 95% prox LAD, 70% prox diag, 40% mid LAD, 30% prox circ, irregularities RCA  2.75 x 12 mm Synergy stent to prox LAD post dil to 2.9 and  2.25 x 12 mm Synergy stent post dial to 2.4 Dr. Herbie [place]   . Deviated septum 10/06/2019  . Diabetes mellitus without complication (HCC)   . Diverticulitis   . Dyslipidemia   . ED (erectile dysfunction)   . Essential hypertension 05/13/2018  . Gastroesophageal reflux disease without esophagitis 10/06/2019  . Globus pharyngeus 10/06/2019  . Hearing loss of left ear 01/12/2020  . HTN (hypertension)   . Hypercholesteremia 01/03/2019  . Hyperlipidemia   . Hyperlipidemia due to type 1 diabetes mellitus (HCC) 05/13/2018  . Hypothyroid 08/28/2013  .  Hypothyroidism   . Left-sided tinnitus 01/12/2020  . Mixed dyslipidemia 01/07/2019  . Nasal turbinate hypertrophy 10/06/2019  . PONV (postoperative nausea and vomiting)   . Rhinitis, chronic 10/06/2019  . Type 1 diabetes mellitus (HCC) 08/28/2013  . Typical atrial flutter [facility])     Past Surgical History:  Procedure Laterality Date  . A-FLUTTER ABLATION N/A 08/28/2019   Procedure: A-FLUTTER ABLATION;  Surgeon: Regan Lemming, MD;  Location: [facility] INVASIVE CV LAB;  Service: Cardiovascular;  Laterality: N/A;  . BACK SURGERY    . CERVICAL DISC SURGERY    . CORONARY STENT INTERVENTION N/A 05/13/2018   Procedure: CORONARY STENT INTERVENTION;  Surgeon: Marykay Lex, MD;  Location: [facility] INVASIVE CV LAB;  Service: Cardiovascular;  Laterality: N/A;  . left hand    . LEFT HEART CATH AND CORONARY ANGIOGRAPHY N/A 05/13/2018   Procedure: LEFT HEART CATH AND CORONARY ANGIOGRAPHY;  Surgeon: Marykay Lex, MD;  Location: [facility] INVASIVE CV LAB;  Service: Cardiovascular;  Laterality: N/A;    Current Medications: Current Meds  Medication Sig  . clopidogrel (PLAVIX) 75 MG tablet Take 75 mg by mouth daily.  . fluticasone (FLONASE) 50 MCG/ACT nasal spray Place 1 spray into both nostrils daily as needed for allergies.   . Glucagon HCl 1 MG SOLR Inject 1 mg into the skin once as needed (blood sugar).  . insulin glargine (LANTUS) 100 UNIT/ML injection Inject 11 Units into the skin as needed. ONLY IF INSULIN PUMP CAN NOT BE USED  As needed  . insulin lispro (HUMALOG) 100 UNIT/ML injection Inject into the skin 3 (three) times daily before meals. Via insulin pump  . levothyroxine (SYNTHROID) 175 MCG tablet Take 175 mcg by mouth every other day.   . levothyroxine (SYNTHROID) 200 MCG tablet Take 200 mcg by mouth every other day.   . loratadine (CLARITIN) 10 MG tablet Take 10 mg by mouth daily as needed for allergies.  . nitroGLYCERIN (NITROSTAT) 0.4 MG SL tablet Place 1 tablet under the tongue every 5 (five) minutes  as needed for chest pain.   Marland Kitchen omega-3 acid ethyl esters (LOVAZA) 1 g capsule Take 1 g by mouth daily.  . rosuvastatin (CRESTOR) 5 MG tablet Take 5 mg by mouth 3 (three) times a week. On Monday, Wednesday, and Friday  . sildenafil (VIAGRA) 100 MG tablet Take 50 mg by mouth daily as needed for erectile dysfunction.   . traZODone (DESYREL) 100 MG tablet Take 100 mg by mouth daily in the afternoon.     Allergies:   Atorvastatin and Erythromycin   Social History   Socioeconomic History  . Marital status: Married    Spouse name: Not on file  . Number of children: Not on file  . Years of education: Not on file  . Highest education level: Not on file  Occupational History  . Not on file  Tobacco Use  . Smoking status: Former Games developer  . Smokeless  tobacco: Never Used  Substance and Sexual Activity  . Alcohol use: Yes  . Drug use: No  . Sexual activity: Yes  Other Topics Concern  . Not on file  Social History Narrative  . Not on file   Social Determinants of Health   Financial Resource Strain: Not on file  Food Insecurity: Not on file  Transportation Needs: Not on file  Physical Activity: Not on file  Stress: Not on file  Social Connections: Not on file     Family History: The patient's family history includes Asthma in his father and mother; COPD in his father; CVA in his paternal grandfather; Heart Problems in his father and paternal grandmother; Hyperlipidemia in his mother and paternal grandfather; Lung cancer in his maternal grandfather; Other in his brother; Stroke in his paternal grandfather.  ROS:   Please see the history of present illness.    All other systems reviewed and are negative.  EKGs/Labs/Other Studies Reviewed:    The following studies were reviewed today: EKG reveals sinus rhythm and nonspecific ST-T changes   Recent Labs: No results found for requested labs within last 8760 hours.  Recent Lipid Panel    Component Value Date/Time   CHOL 132  06/01/2019 0937   TRIG 58 06/01/2019 0937   HDL 71 06/01/2019 0937   CHOLHDL 1.9 06/01/2019 0937   LDLCALC 49 06/01/2019 0937    Physical Exam:    VS:  BP 126/72   Pulse 88   Ht 5\' 10"  (1.778 m)   Wt 180 lb 1.3 oz (81.7 kg)   SpO2 97%   BMI 25.84 kg/m     Wt Readings from Last 3 Encounters:  12/29/20 180 lb 1.3 oz (81.7 kg)  10/06/19 183 lb 12.8 oz (83.4 kg)  08/28/19 175 lb (79.4 kg)     GEN: Patient is in no acute distress HEENT: Normal NECK: No JVD; No carotid bruits LYMPHATICS: No lymphadenopathy CARDIAC: Hear sounds regular, 2/6 systolic murmur at the apex. RESPIRATORY:  Clear to auscultation without rales, wheezing or rhonchi  ABDOMEN: Soft, non-tender, non-distended MUSCULOSKELETAL:  No edema; No deformity  SKIN: Warm and dry NEUROLOGIC:  Alert and oriented x 3 PSYCHIATRIC:  Normal affect   Signed, 08/30/19, MD  12/29/2020 9:55 AM    [facility]

## 2020-12-30 LAB — HEPATIC FUNCTION PANEL
ALT: 16 IU/L (ref 0–44)
AST: 16 IU/L (ref 0–40)
Albumin: 4 g/dL (ref 3.8–4.8)
Alkaline Phosphatase: 61 IU/L (ref 44–121)
Bilirubin Total: 0.5 mg/dL (ref 0.0–1.2)
Bilirubin, Direct: 0.13 mg/dL (ref 0.00–0.40)
Total Protein: 6.3 g/dL (ref 6.0–8.5)

## 2020-12-30 LAB — HEMOGLOBIN A1C
Est. average glucose Bld gHb Est-mCnc: 137 mg/dL
Hgb A1c MFr Bld: 6.4 % — ABNORMAL HIGH (ref 4.8–5.6)

## 2020-12-30 LAB — BASIC METABOLIC PANEL
BUN/Creatinine Ratio: 5 — ABNORMAL LOW (ref 10–24)
BUN: 5 mg/dL — ABNORMAL LOW (ref 8–27)
CO2: 21 mmol/L (ref 20–29)
Calcium: 8.9 mg/dL (ref 8.6–10.2)
Chloride: 103 mmol/L (ref 96–106)
Creatinine, Ser: 0.91 mg/dL (ref 0.76–1.27)
GFR calc Af Amer: 102 mL/min/{1.73_m2} (ref >59)
GFR calc non Af Amer: 88 mL/min/{1.73_m2} (ref >59)
Glucose: 154 mg/dL — ABNORMAL HIGH (ref 65–99)
Potassium: 4.2 mmol/L (ref 3.5–5.2)
Sodium: 139 mmol/L (ref 134–144)

## 2020-12-30 LAB — LIPID PANEL
Chol/HDL Ratio: 3.5 ratio (ref 0.0–5.0)
Cholesterol, Total: 198 mg/dL (ref 100–199)
HDL: 56 mg/dL (ref >39)
LDL Chol Calc (NIH): 124 mg/dL — ABNORMAL HIGH (ref 0–99)
Triglycerides: 101 mg/dL (ref 0–149)
VLDL Cholesterol Cal: 18 mg/dL (ref 5–40)

## 2020-12-30 MED ORDER — ROSUVASTATIN CALCIUM 5 MG PO TABS: 5.0000 mg | ORAL_TABLET | Freq: Every day | ORAL | 3 refills | Status: DC

## 2020-12-30 NOTE — Addendum Note (Signed)
Addended by: Belva Crome R on: 12/30/2020 11:39 AM   Modules accepted: Orders

## 2020-12-30 NOTE — Addendum Note (Signed)
Addended by: Eleonore Chiquito on: 12/30/2020 11:38 AM   Modules accepted: Orders

## 2020-12-30 NOTE — Addendum Note (Signed)
Addended by: Eleonore Chiquito on: 12/30/2020 01:18 PM   Modules accepted: Orders

## 2021-01-04 ENCOUNTER — Telehealth (HOSPITAL_COMMUNITY): Payer: Self-pay

## 2021-01-04 NOTE — Telephone Encounter (Signed)
Attempted to contact the patient. The line was busy x2. Will try again later. S.Kirah Stice EMTP

## 2021-01-05 ENCOUNTER — Ambulatory Visit (HOSPITAL_COMMUNITY): Payer: Medicare Other | Attending: Internal Medicine

## 2021-01-05 ENCOUNTER — Other Ambulatory Visit: Payer: Self-pay

## 2021-01-05 DIAGNOSIS — I209 Angina pectoris, unspecified: Secondary | ICD-10-CM

## 2021-01-05 DIAGNOSIS — E119 Type 2 diabetes mellitus without complications: Secondary | ICD-10-CM | POA: Diagnosis not present

## 2021-01-05 DIAGNOSIS — I2510 Atherosclerotic heart disease of native coronary artery without angina pectoris: Secondary | ICD-10-CM

## 2021-01-05 DIAGNOSIS — E785 Hyperlipidemia, unspecified: Secondary | ICD-10-CM

## 2021-01-05 DIAGNOSIS — I4892 Unspecified atrial flutter: Secondary | ICD-10-CM

## 2021-01-05 DIAGNOSIS — I10 Essential (primary) hypertension: Secondary | ICD-10-CM | POA: Diagnosis not present

## 2021-01-05 DIAGNOSIS — E782 Mixed hyperlipidemia: Secondary | ICD-10-CM

## 2021-01-05 LAB — MYOCARDIAL PERFUSION IMAGING
Estimated workload: 8.8 METS
Exercise duration (min): 8 min
LV dias vol: 80 mL (ref 62–150)
LV sys vol: 30 mL
MPHR: 155 {beats}/min
Peak HR: 142 {beats}/min
Percent HR: 91 %
Rest HR: 71 {beats}/min
SDS: 0
SRS: 0
SSS: 0
TID: 0.97

## 2021-01-05 MED ORDER — TECHNETIUM TC 99M TETROFOSMIN IV KIT
10.1000 | PACK | Freq: Once | INTRAVENOUS | Status: AC | PRN
  Administered 2021-01-05: 10.1 via INTRAVENOUS
  Filled 2021-01-05: qty 11

## 2021-01-05 MED ORDER — TECHNETIUM TC 99M TETROFOSMIN IV KIT
31.6000 | PACK | Freq: Once | INTRAVENOUS | Status: AC | PRN
  Administered 2021-01-05: 31.6 via INTRAVENOUS
  Filled 2021-01-05: qty 32

## 2021-02-06 DIAGNOSIS — N529 Male erectile dysfunction, unspecified: Secondary | ICD-10-CM | POA: Insufficient documentation

## 2021-02-06 DIAGNOSIS — Z794 Long term (current) use of insulin: Secondary | ICD-10-CM

## 2021-02-06 DIAGNOSIS — R1032 Left lower quadrant pain: Secondary | ICD-10-CM

## 2021-02-06 DIAGNOSIS — F329 Major depressive disorder, single episode, unspecified: Secondary | ICD-10-CM | POA: Insufficient documentation

## 2021-02-06 DIAGNOSIS — G4700 Insomnia, unspecified: Secondary | ICD-10-CM

## 2021-02-06 DIAGNOSIS — G629 Polyneuropathy, unspecified: Secondary | ICD-10-CM

## 2021-02-06 DIAGNOSIS — M545 Low back pain, unspecified: Secondary | ICD-10-CM

## 2021-02-06 DIAGNOSIS — E663 Overweight: Secondary | ICD-10-CM

## 2021-02-06 DIAGNOSIS — G4733 Obstructive sleep apnea (adult) (pediatric): Secondary | ICD-10-CM

## 2021-02-06 DIAGNOSIS — E7800 Pure hypercholesterolemia, unspecified: Secondary | ICD-10-CM

## 2021-02-06 DIAGNOSIS — I2510 Atherosclerotic heart disease of native coronary artery without angina pectoris: Secondary | ICD-10-CM | POA: Insufficient documentation

## 2021-02-06 DIAGNOSIS — J320 Chronic maxillary sinusitis: Secondary | ICD-10-CM

## 2021-02-06 DIAGNOSIS — R899 Unspecified abnormal finding in specimens from other organs, systems and tissues: Secondary | ICD-10-CM

## 2021-02-06 HISTORY — DX: Low back pain, unspecified: M54.50

## 2021-02-06 HISTORY — DX: Chronic maxillary sinusitis: J32.0

## 2021-02-06 HISTORY — DX: Pure hypercholesterolemia, unspecified: E78.00

## 2021-02-06 HISTORY — DX: Obstructive sleep apnea (adult) (pediatric): G47.33

## 2021-02-06 HISTORY — DX: Insomnia, unspecified: G47.00

## 2021-02-06 HISTORY — DX: Left lower quadrant pain: R10.32

## 2021-02-06 HISTORY — DX: Unspecified abnormal finding in specimens from other organs, systems and tissues: R89.9

## 2021-02-06 HISTORY — DX: Atherosclerotic heart disease of native coronary artery without angina pectoris: I25.10

## 2021-02-06 HISTORY — DX: Polyneuropathy, unspecified: G62.9

## 2021-02-06 HISTORY — DX: Major depressive disorder, single episode, unspecified: F32.9

## 2021-02-06 HISTORY — DX: Male erectile dysfunction, unspecified: N52.9

## 2021-02-06 HISTORY — DX: Long term (current) use of insulin: Z79.4

## 2021-02-06 HISTORY — DX: Overweight: E66.3

## 2021-02-07 ENCOUNTER — Ambulatory Visit (INDEPENDENT_AMBULATORY_CARE_PROVIDER_SITE_OTHER): Payer: Medicare Other | Admitting: Cardiology

## 2021-02-07 ENCOUNTER — Encounter: Payer: Self-pay | Admitting: Cardiology

## 2021-02-07 ENCOUNTER — Other Ambulatory Visit: Payer: Self-pay

## 2021-02-07 VITALS — BP 122/64 | HR 84 | Ht 70.0 in | Wt 184.1 lb

## 2021-02-07 DIAGNOSIS — I2510 Atherosclerotic heart disease of native coronary artery without angina pectoris: Secondary | ICD-10-CM | POA: Diagnosis not present

## 2021-02-07 DIAGNOSIS — E782 Mixed hyperlipidemia: Secondary | ICD-10-CM | POA: Diagnosis not present

## 2021-02-07 DIAGNOSIS — E119 Type 2 diabetes mellitus without complications: Secondary | ICD-10-CM

## 2021-02-07 DIAGNOSIS — I10 Essential (primary) hypertension: Secondary | ICD-10-CM

## 2021-02-07 MED ORDER — RANOLAZINE ER 1000 MG PO TB12: 1000.0000 mg | ORAL_TABLET | Freq: Two times a day (BID) | ORAL | 3 refills | Status: DC

## 2021-02-07 NOTE — Progress Notes (Signed)
Cardiology Office Note:    Date:  02/07/2021   ID:  Mark Shah, DOB May 09, 1956, MRN [id number]  PCP:  Catha GosselinLittle, Kevin, MD  Cardiologist:  Garwin Brothersajan R Orella Cushman, MD   Referring MD: Catha GosselinLittle, Kevin, MD    ASSESSMENT:    1. Coronary artery disease involving native coronary artery of native heart without angina pectoris   2. Essential hypertension   3. Diabetes mellitus without complication (HCC)    PLAN:    In order of problems listed above:  1. Coronary artery disease: Secondary prevention stressed with patient.  Importance of compliance with diet medication stressed any vocalized understanding.  She has excellent effort tolerance and stress test report was discussed with him at length and he was happy about it. 2. Essential hypertension: Blood pressure stable and diet was emphasized. 3. Mixed dyslipidemia and diabetes mellitus: Patient's lipids are fine.  LDL is elevated.  He is increased his rosuvastatin and will be back in 1 month for liver lipid check.  He has had issues with mild LFT elevation in the past the last LFTs were fine.  Benefits and risks of these medications explained to his understanding.  Questions were answered. 4. Patient will be seen in follow-up appointment in 6 months or earlier if the patient has any concerns    Medication Adjustments/Labs and Tests Ordered: Current medicines are reviewed at length with the patient today.  Concerns regarding medicines are outlined above.  No orders of the defined types were placed in this encounter.  No orders of the defined types were placed in this encounter.    No chief complaint on file.    History of Present Illness:    Mark AmenJoseph Shah is a 65 y.o. male.  Patient has past medical history of coronary artery disease, essential hypertension and diabetes mellitus.  He denies any problems at this time and takes care of activities of daily living.  No chest pain orthopnea or PND.  At the time of my evaluation, the patient  is alert awake oriented and in no distress.  He walks about half an hour on a daily basis.  Without any symptoms.  Past Medical History:  Diagnosis Date  . Adhesive capsulitis    in shoulders  . Atherosclerotic heart disease of native coronary artery without angina pectoris 02/06/2021  . Atrial fibrillation with RVR (HCC) 01/03/2019  . Atrial flutter, paroxysmal (HCC) 01/07/2019  . CAD (coronary artery disease), native coronary artery 05/14/2018   Cath 05/13/18 LM normal, ostial LAD 25%, 95% prox LAD, 70% prox diag, 40% mid LAD, 30% prox circ, irregularities RCA  2.75 x 12 mm Synergy stent to prox LAD post dil to 2.9 and 2.25 x 12 mm Synergy stent post dial to 2.4 Dr. Herbie BaltimoreHarding   . Chronic maxillary sinusitis 02/06/2021  . Deviated septum 10/06/2019  . Diabetes mellitus without complication (HCC)   . Diverticulitis   . Dyslipidemia   . ED (erectile dysfunction)   . ED (erectile dysfunction) of organic origin 02/06/2021  . Essential hypertension 05/13/2018  . Gastroesophageal reflux disease without esophagitis 10/06/2019  . Globus pharyngeus 10/06/2019  . Hearing loss of left ear 01/12/2020  . HTN (hypertension)   . Hypercholesteremia 01/03/2019  . Hyperglycemia due to type 1 diabetes mellitus (HCC) 05/13/2018  . Hyperlipidemia   . Hyperlipidemia due to type 1 diabetes mellitus (HCC) 05/13/2018  . Hyperlipidemia, unspecified   . Hypothyroid 08/28/2013  . Hypothyroidism   . Insomnia 02/06/2021  . Left lower quadrant pain 02/06/2021  .  Left-sided tinnitus 01/12/2020  . Long term (current) use of insulin (HCC) 02/06/2021  . Low back pain 02/06/2021  . Mixed dyslipidemia 01/07/2019  . Nasal turbinate hypertrophy 10/06/2019  . Obstructive sleep apnea syndrome 02/06/2021  . Overweight 02/06/2021  . Peripheral neuropathy 02/06/2021  . PONV (postoperative nausea and vomiting)   . Pure hypercholesterolemia 02/06/2021  . Reactive depression 02/06/2021  . Rhinitis, chronic 10/06/2019  . Type 1 diabetes mellitus (HCC) 08/28/2013  .  Typical atrial flutter (HCC)   . Unspecified abnormal finding in specimens from other organs, systems and tissues 02/06/2021    Past Surgical History:  Procedure Laterality Date  . A-FLUTTER ABLATION N/A 08/28/2019   Procedure: A-FLUTTER ABLATION;  Surgeon: Regan Lemming, MD;  Location: [facility] INVASIVE CV LAB;  Service: Cardiovascular;  Laterality: N/A;  . BACK SURGERY    . CERVICAL DISC SURGERY    . CORONARY STENT INTERVENTION N/A 05/13/2018   Procedure: CORONARY STENT INTERVENTION;  Surgeon: Marykay Lex, MD;  Location: [facility] INVASIVE CV LAB;  Service: Cardiovascular;  Laterality: N/A;  . left hand    . LEFT HEART CATH AND CORONARY ANGIOGRAPHY N/A 05/13/2018   Procedure: LEFT HEART CATH AND CORONARY ANGIOGRAPHY;  Surgeon: Marykay Lex, MD;  Location: [facility] INVASIVE CV LAB;  Service: Cardiovascular;  Laterality: N/A;    Current Medications: Current Meds  Medication Sig  . clopidogrel (PLAVIX) 75 MG tablet Take 75 mg by mouth daily.  . fluticasone (FLONASE) 50 MCG/ACT nasal spray Place 1 spray into both nostrils daily as needed for allergies.   . Glucagon HCl 1 MG SOLR Inject 1 mg into the skin once as needed (blood sugar).  . insulin glargine (LANTUS) 100 UNIT/ML Solostar Pen Inject 11 Units into the skin as needed. For blood sugar only if insulin pump cannot be used  . insulin lispro (HUMALOG) 100 UNIT/ML injection Inject 20 Units into the skin 3 (three) times daily as needed for high blood sugar (if insulin pump malfunction).  Marland Kitchen levothyroxine (SYNTHROID) 175 MCG tablet Take 175 mcg by mouth every other day.   . levothyroxine (SYNTHROID) 200 MCG tablet Take 200 mcg by mouth every other day.   . loratadine (CLARITIN) 10 MG tablet Take 10 mg by mouth daily as needed for allergies.  Marland Kitchen LYUMJEV 100 UNIT/ML SOLN Inject 60 Units into the skin daily. Via insulin pump  . nitroGLYCERIN (NITROSTAT) 0.4 MG SL tablet Place 1 tablet under the tongue every 5 (five) minutes as needed for chest pain.    Marland Kitchen omega-3 acid ethyl esters (LOVAZA) 1 g capsule Take 1 g by mouth daily.  . ranolazine (RANEXA) 1000 MG SR tablet Take 1 tablet (1,000 mg total) by mouth 2 (two) times daily.  . rosuvastatin (CRESTOR) 5 MG tablet Take 1 tablet (5 mg total) by mouth daily. On Monday, Wednesday, and Friday  . sildenafil (VIAGRA) 100 MG tablet Take 50 mg by mouth daily as needed for erectile dysfunction.   . traZODone (DESYREL) 100 MG tablet Take 100 mg by mouth daily in the afternoon.     Allergies:   Atorvastatin and Erythromycin   Social History   Socioeconomic History  . Marital status: Married    Spouse name: Not on file  . Number of children: Not on file  . Years of education: Not on file  . Highest education level: Not on file  Occupational History  . Not on file  Tobacco Use  . Smoking status: Former Games developer  . Smokeless tobacco: Never  Used  Substance and Sexual Activity  . Alcohol use: Yes  . Drug use: No  . Sexual activity: Yes  Other Topics Concern  . Not on file  Social History Narrative  . Not on file   Social Determinants of Health   Financial Resource Strain: Not on file  Food Insecurity: Not on file  Transportation Needs: Not on file  Physical Activity: Not on file  Stress: Not on file  Social Connections: Not on file     Family History: The patient's family history includes Asthma in his father and mother; COPD in his father; CVA in his paternal grandfather; Heart Problems in his father and paternal grandmother; Hyperlipidemia in his mother and paternal grandfather; Lung cancer in his maternal grandfather; Other in his brother; Stroke in his paternal grandfather.  ROS:   Please see the history of present illness.    All other systems reviewed and are negative.  EKGs/Labs/Other Studies Reviewed:    The following studies were reviewed today: Marykay Lex, MD (Primary)      Procedures  CORONARY STENT INTERVENTION  LEFT HEART CATH AND CORONARY ANGIOGRAPHY    Conclusion    CULPRIT LESION: Prox LAD lesion is 95% stenosed.  A drug-eluting stent was successfully placed using a STENT SYNERGY DES 2.75X12. Postdilated to 2.9 mm  Post intervention, there is a 0% residual stenosis.  _____________________________________________________  Lesion #2: Ost 1st Diag lesion is 75% stenosed.  A drug-eluting stent was successfully placed using a STENT SYNERGY DES 2.25X12. Postdilated 2.4 mm  Post intervention, there is a 0% residual stenosis.  ______________________________________________________  Suezanne Jacquet LAD lesion is 25% stenosed -lesion not involved with stent  Mid LAD-1 lesion is 50% stenosed. Mid LAD-2 lesion is 40% stenosed.  Prox Cx lesion is 30% stenosed.  The left ventricular systolic function is normal. LVEF 55-65% by visual estimate. Normal LVEDP (10 mmHg)    Severe two-vessel disease involving the proximal LAD and proximal 1st Diag branch --> post lesion successfully treated with DES stents.  Preserved LVEF with normal LVEDP  Plan: Return to short stay holding area for post PCI care.  Anticipate same-day discharge today. Recommend dual antiplatelet therapy with Aspirin 81mg  daily and Clopidogrel 75mg  daily long-term (beyond 12 months) because of Proximal LAD lesion (would be OK to stop ASA before 12 months if necessary).   He will follow-up with Dr. , M.D., M.S. Interventional Cardiologist   Pager # [phone] Phone # [phone] [address]      Recent Labs: 12/29/2020: ALT 16; BUN 5; Creatinine, Ser 0.91; Potassium 4.2; Sodium 139  Recent Lipid Panel    Component Value Date/Time   CHOL 198 12/29/2020 1013   TRIG 101 12/29/2020 1013   HDL 56 12/29/2020 1013   CHOLHDL 3.5 12/29/2020 1013   LDLCALC 124 (H) 12/29/2020 1013    Physical Exam:    VS:  BP 122/64   Pulse 84   Ht 5\' 10"  (1.778 m)   Wt 184 lb 1.3 oz (83.5 kg)   SpO2 99%   BMI 26.41 kg/m     Wt  Readings from Last 3 Encounters:  02/07/21 184 lb 1.3 oz (83.5 kg)  01/05/21 180 lb (81.6 kg)  12/29/20 180 lb 1.3 oz (81.7 kg)     GEN: Patient is in no acute distress HEENT: Normal NECK: No JVD; No carotid bruits LYMPHATICS: No lymphadenopathy CARDIAC: Hear sounds regular, 2/6 systolic murmur at the apex. RESPIRATORY:  Clear to  auscultation without rales, wheezing or rhonchi  ABDOMEN: Soft, non-tender, non-distended MUSCULOSKELETAL:  No edema; No deformity  SKIN: Warm and dry NEUROLOGIC:  Alert and oriented x 3 PSYCHIATRIC:  Normal affect   Signed, Garwin Brothers, MD  02/07/2021 11:00 AM    [facility]

## 2021-02-07 NOTE — Patient Instructions (Signed)
Medication Instructions:  No medication changes. *If you need a refill on your cardiac medications before your next appointment, please call your pharmacy*   Lab Work: Your physician recommends that you return for lab work in: 1 month  You need to have labs done when you are fasting.  You can come Monday through Friday 8:30 am to 12:00 pm and 1:15 to 4:30. You do not need to make an appointment as the order has already been placed. The labs you are going to have done are BMET, LFT and Lipids.  If you have labs (blood work) drawn today and your tests are completely normal, you will receive your results only by: MyChart Message (if you have MyChart) OR A paper copy in the mail If you have any lab test that is abnormal or we need to change your treatment, we will call you to review the results.   Testing/Procedures: None ordered   Follow-Up: At [facility], you and your health needs are our priority.  As part of our continuing mission to provide you with exceptional heart care, we have created designated Provider Care Teams.  These Care Teams include your primary Cardiologist (physician) and Advanced Practice Providers (APPs -  Physician Assistants and Nurse Practitioners) who all work together to provide you with the care you need, when you need it.  We recommend signing up for the patient portal called "MyChart".  Sign up information is provided on this After Visit Summary.  MyChart is used to connect with patients for Virtual Visits (Telemedicine).  Patients are able to view lab/test results, encounter notes, upcoming appointments, etc.  Non-urgent messages can be sent to your provider as well.   To learn more about what you can do with MyChart, go to [email or web address].    Your next appointment:   6 month(s)  The format for your next appointment:   In Person  Provider:   Rajan Revankar, MD   Other Instructions NA  

## 2021-04-27 ENCOUNTER — Other Ambulatory Visit: Payer: Self-pay

## 2021-04-27 ENCOUNTER — Encounter (HOSPITAL_BASED_OUTPATIENT_CLINIC_OR_DEPARTMENT_OTHER): Payer: Self-pay

## 2021-04-27 ENCOUNTER — Emergency Department (HOSPITAL_BASED_OUTPATIENT_CLINIC_OR_DEPARTMENT_OTHER)
Admission: EM | Admit: 2021-04-27 | Discharge: 2021-04-28 | Disposition: A | Discharge status: Home / Self Care | Payer: Medicare Other | Attending: Emergency Medicine | Admitting: Emergency Medicine

## 2021-04-27 DIAGNOSIS — Z87891 Personal history of nicotine dependence: Secondary | ICD-10-CM | POA: Diagnosis not present

## 2021-04-27 DIAGNOSIS — Z955 Presence of coronary angioplasty implant and graft: Secondary | ICD-10-CM | POA: Insufficient documentation

## 2021-04-27 DIAGNOSIS — E039 Hypothyroidism, unspecified: Secondary | ICD-10-CM | POA: Diagnosis not present

## 2021-04-27 DIAGNOSIS — I2510 Atherosclerotic heart disease of native coronary artery without angina pectoris: Secondary | ICD-10-CM | POA: Insufficient documentation

## 2021-04-27 DIAGNOSIS — E10649 Type 1 diabetes mellitus with hypoglycemia without coma: Secondary | ICD-10-CM | POA: Insufficient documentation

## 2021-04-27 DIAGNOSIS — Z794 Long term (current) use of insulin: Secondary | ICD-10-CM | POA: Diagnosis not present

## 2021-04-27 DIAGNOSIS — Z79899 Other long term (current) drug therapy: Secondary | ICD-10-CM | POA: Insufficient documentation

## 2021-04-27 DIAGNOSIS — I10 Essential (primary) hypertension: Secondary | ICD-10-CM | POA: Diagnosis not present

## 2021-04-27 DIAGNOSIS — R112 Nausea with vomiting, unspecified: Secondary | ICD-10-CM | POA: Diagnosis present

## 2021-04-27 DIAGNOSIS — E162 Hypoglycemia, unspecified: Secondary | ICD-10-CM

## 2021-04-27 LAB — COMPREHENSIVE METABOLIC PANEL
ALT: 18 U/L (ref 0–44)
AST: 18 U/L (ref 15–41)
Albumin: 4.1 g/dL (ref 3.5–5.0)
Alkaline Phosphatase: 60 U/L (ref 38–126)
Anion gap: 10 (ref 5–15)
BUN: 7 mg/dL — ABNORMAL LOW (ref 8–23)
CO2: 26 mmol/L (ref 22–32)
Calcium: 8.9 mg/dL (ref 8.9–10.3)
Chloride: 102 mmol/L (ref 98–111)
Creatinine, Ser: 1.13 mg/dL (ref 0.61–1.24)
GFR, Estimated: >60 mL/min (ref >60)
Glucose, Bld: 51 mg/dL — ABNORMAL LOW (ref 70–99)
Potassium: 3.4 mmol/L — ABNORMAL LOW (ref 3.5–5.1)
Sodium: 138 mmol/L (ref 135–145)
Total Bilirubin: 0.5 mg/dL (ref 0.3–1.2)
Total Protein: 6.6 g/dL (ref 6.5–8.1)

## 2021-04-27 LAB — CBC WITH DIFFERENTIAL/PLATELET
Abs Immature Granulocytes: 0.06 10*3/uL (ref 0.00–0.07)
Basophils Absolute: 0.1 10*3/uL (ref 0.0–0.1)
Basophils Relative: 1 %
Eosinophils Absolute: 0 10*3/uL (ref 0.0–0.5)
Eosinophils Relative: 0 %
HCT: 38.4 % — ABNORMAL LOW (ref 39.0–52.0)
Hemoglobin: 13.6 g/dL (ref 13.0–17.0)
Immature Granulocytes: 1 %
Lymphocytes Relative: 16 %
Lymphs Abs: 1.7 10*3/uL (ref 0.7–4.0)
MCH: 33.1 pg (ref 26.0–34.0)
MCHC: 35.4 g/dL (ref 30.0–36.0)
MCV: 93.4 fL (ref 80.0–100.0)
Monocytes Absolute: 0.8 10*3/uL (ref 0.1–1.0)
Monocytes Relative: 7 %
Neutro Abs: 8.1 10*3/uL — ABNORMAL HIGH (ref 1.7–7.7)
Neutrophils Relative %: 75 %
Platelets: 237 10*3/uL (ref 150–400)
RBC: 4.11 MIL/uL — ABNORMAL LOW (ref 4.22–5.81)
RDW: 12.3 % (ref 11.5–15.5)
WBC: 10.8 10*3/uL — ABNORMAL HIGH (ref 4.0–10.5)
nRBC: 0 % (ref 0.0–0.2)

## 2021-04-27 LAB — CBG MONITORING, ED: Glucose-Capillary: 91 mg/dL (ref 70–99)

## 2021-04-27 MED ORDER — ONDANSETRON 4 MG PO TBDP
8.0000 mg | ORAL_TABLET | Freq: Once | ORAL | Status: AC
  Administered 2021-04-27: 8 mg via ORAL
  Filled 2021-04-27: qty 2

## 2021-04-27 NOTE — ED Notes (Signed)
Pt. Stated his blood sugar was low. Checked CBG-42. Pt. Is A/Ox4 -gave juice and something to eat.Will recheck in 15 minutes.

## 2021-04-27 NOTE — ED Triage Notes (Signed)
Pt states his BGL has been dropping and couldn't able to  bring up despite taking juice and ice cream   Pt c/o vomiting  - BGL  91 mg /ml at triage   PMX - had 2 stent in , ablation , DM type 1 ,  HTN

## 2021-04-28 DIAGNOSIS — E10649 Type 1 diabetes mellitus with hypoglycemia without coma: Secondary | ICD-10-CM | POA: Diagnosis not present

## 2021-04-28 LAB — URINALYSIS, ROUTINE W REFLEX MICROSCOPIC
Bilirubin Urine: NEGATIVE
Glucose, UA: NEGATIVE mg/dL
Hgb urine dipstick: NEGATIVE
Ketones, ur: NEGATIVE mg/dL
Leukocytes,Ua: NEGATIVE
Nitrite: NEGATIVE
Protein, ur: NEGATIVE mg/dL
Specific Gravity, Urine: 1.014 (ref 1.005–1.030)
pH: 7.5 (ref 5.0–8.0)

## 2021-04-28 LAB — CBG MONITORING, ED
Glucose-Capillary: 100 mg/dL — ABNORMAL HIGH (ref 70–99)
Glucose-Capillary: 112 mg/dL — ABNORMAL HIGH (ref 70–99)
Glucose-Capillary: 42 mg/dL — CL (ref 70–99)

## 2021-04-28 MED ORDER — ONDANSETRON 4 MG PO TBDP: ORAL_TABLET | ORAL | 0 refills | Status: DC

## 2021-04-28 NOTE — ED Provider Notes (Signed)
[facility] EMERGENCY DEPT Provider Note   CSN: [id number] Arrival date & time: 04/27/21  2251     History Chief Complaint  Patient presents with   Emesis    Mark Shah is a 65 y.o. male.  Felt hypoglycemic before supper.  Reddys up and started throwing up.  Patient is off his insulin pump but Having difficulty keeping food and liquids down and thus his blood sugar continued to be low.  Presented here for further evaluation.   Emesis Severity:  Moderate Duration:  5 hours Timing:  Intermittent Quality:  Stomach contents Progression:  Improving Chronicity:  Recurrent Recent urination:  Normal Context comment:  Hypoglycemia Relieved by:  None tried Worsened by:  Food smell Associated symptoms: no abdominal pain, no chills, no fever, no sore throat and no URI       Past Medical History:  Diagnosis Date   Adhesive capsulitis    in shoulders   Atherosclerotic heart disease of native coronary artery without angina pectoris 02/06/2021   Atrial fibrillation with RVR (HCC) 01/03/2019   Atrial flutter, paroxysmal (HCC) 01/07/2019   CAD (coronary artery disease), native coronary artery 05/14/2018   Cath 05/13/18 LM normal, ostial LAD 25%, 95% prox LAD, 70% prox diag, 40% mid LAD, 30% prox circ, irregularities RCA  2.75 x 12 mm Synergy stent to prox LAD post dil to 2.9 and 2.25 x 12 mm Synergy stent post dial to 2.4 Dr. Herbie Baltimore    Chronic maxillary sinusitis 02/06/2021   Deviated septum 10/06/2019   Diabetes mellitus without complication (HCC)    Diverticulitis    Dyslipidemia    ED (erectile dysfunction)    ED (erectile dysfunction) of organic origin 02/06/2021   Essential hypertension 05/13/2018   Gastroesophageal reflux disease without esophagitis 10/06/2019   Globus pharyngeus 10/06/2019   Hearing loss of left ear 01/12/2020   HTN (hypertension)    Hypercholesteremia 01/03/2019   Hyperglycemia due to type 1 diabetes mellitus (HCC) 05/13/2018   Hyperlipidemia     Hyperlipidemia due to type 1 diabetes mellitus (HCC) 05/13/2018   Hyperlipidemia, unspecified    Hypothyroid 08/28/2013   Hypothyroidism    Insomnia 02/06/2021   Left lower quadrant pain 02/06/2021   Left-sided tinnitus 01/12/2020   Long term (current) use of insulin (HCC) 02/06/2021   Low back pain 02/06/2021   Mixed dyslipidemia 01/07/2019   Nasal turbinate hypertrophy 10/06/2019   Obstructive sleep apnea syndrome 02/06/2021   Overweight 02/06/2021   Peripheral neuropathy 02/06/2021   PONV (postoperative nausea and vomiting)    Pure hypercholesterolemia 02/06/2021   Reactive depression 02/06/2021   Rhinitis, chronic 10/06/2019   Type 1 diabetes mellitus (HCC) 08/28/2013   Typical atrial flutter (HCC)    Unspecified abnormal finding in specimens from other organs, systems and tissues 02/06/2021    Patient Active Problem List   Diagnosis Date Noted   Insomnia 02/06/2021   Left lower quadrant pain 02/06/2021   Long term (current) use of insulin (HCC) 02/06/2021   Low back pain 02/06/2021   Overweight 02/06/2021   Peripheral neuropathy 02/06/2021   Reactive depression 02/06/2021   Obstructive sleep apnea syndrome 02/06/2021   Unspecified abnormal finding in specimens from other organs, systems and tissues 02/06/2021   Atherosclerotic heart disease of native coronary artery without angina pectoris 02/06/2021   ED (erectile dysfunction) of organic origin 02/06/2021   Pure hypercholesterolemia 02/06/2021   Chronic maxillary sinusitis 02/06/2021   Adhesive capsulitis    Diabetes mellitus without complication (HCC)    Dyslipidemia  ED (erectile dysfunction)    HTN (hypertension)    Hyperlipidemia, unspecified    Hypothyroidism    PONV (postoperative nausea and vomiting)    Hearing loss of left ear 01/12/2020   Left-sided tinnitus 01/12/2020   Deviated septum 10/06/2019   Gastroesophageal reflux disease without esophagitis 10/06/2019   Globus pharyngeus 10/06/2019   Nasal turbinate hypertrophy  10/06/2019   Rhinitis, chronic 10/06/2019   Typical atrial flutter (HCC)    Mixed dyslipidemia 01/07/2019   Atrial flutter, paroxysmal (HCC) 01/07/2019   Atrial fibrillation with RVR (HCC) 01/03/2019   Hypercholesteremia 01/03/2019   CAD (coronary artery disease), native coronary artery 05/14/2018   Essential hypertension 05/13/2018   Hyperglycemia due to type 1 diabetes mellitus (HCC) 05/13/2018   Type 1 diabetes mellitus (HCC) 08/28/2013   Diverticulitis 08/28/2013   Hypothyroid 08/28/2013    Past Surgical History:  Procedure Laterality Date   A-FLUTTER ABLATION N/A 08/28/2019   Procedure: A-FLUTTER ABLATION;  Surgeon: Regan Lemming, MD;  Location: [facility] INVASIVE CV LAB;  Service: Cardiovascular;  Laterality: N/A;   BACK SURGERY     CERVICAL DISC SURGERY     CORONARY STENT INTERVENTION N/A 05/13/2018   Procedure: CORONARY STENT INTERVENTION;  Surgeon: Marykay Lex, MD;  Location: [facility] INVASIVE CV LAB;  Service: Cardiovascular;  Laterality: N/A;   left hand     LEFT HEART CATH AND CORONARY ANGIOGRAPHY N/A 05/13/2018   Procedure: LEFT HEART CATH AND CORONARY ANGIOGRAPHY;  Surgeon: Marykay Lex, MD;  Location: [facility] INVASIVE CV LAB;  Service: Cardiovascular;  Laterality: N/A;       Family History  Problem Relation Age of Onset   Hyperlipidemia Mother    Asthma Mother    Asthma Father    COPD Father    Heart Problems Father        bicuspid aortic valve replacement   Other Brother        orthopedic problems   Lung cancer Maternal Grandfather    Heart Problems Paternal Grandmother        aortic v alve replacement   Hyperlipidemia Paternal Grandfather    Stroke Paternal Grandfather    CVA Paternal Grandfather     Social History   Tobacco Use   Smoking status: Former    Pack years: 0.00   Smokeless tobacco: Never  Substance Use Topics   Alcohol use: Yes   Drug use: No    Home Medications Prior to Admission medications   Medication Sig Start Date End Date  Taking? Authorizing Provider  ondansetron (ZOFRAN ODT) 4 MG disintegrating tablet 4mg  ODT q4 hours prn nausea/vomit 04/28/21  Yes Rayen Palen, 04/30/21, MD  clopidogrel (PLAVIX) 75 MG tablet Take 75 mg by mouth daily.    [provider]  fluticasone (FLONASE) 50 MCG/ACT nasal spray Place 1 spray into both nostrils daily as needed for allergies.  12/28/15   [provider]  Glucagon HCl 1 MG SOLR Inject 1 mg into the skin once as needed (blood sugar).    [provider]  insulin glargine (LANTUS) 100 UNIT/ML Solostar Pen Inject 11 Units into the skin as needed. For blood sugar only if insulin pump cannot be used    [provider]  insulin lispro (HUMALOG) 100 UNIT/ML injection Inject 20 Units into the skin 3 (three) times daily as needed for high blood sugar (if insulin pump malfunction).    [provider]  levothyroxine (SYNTHROID) 175 MCG tablet Take 175 mcg by mouth every other day.  [provider]  levothyroxine (SYNTHROID) 200 MCG tablet Take 200 mcg by mouth every other day.     [provider]  loratadine (CLARITIN) 10 MG tablet Take 10 mg by mouth daily as needed for allergies.    [provider]  LYUMJEV 100 UNIT/ML SOLN Inject 60 Units into the skin daily. Via insulin pump 01/26/21   [provider]  nitroGLYCERIN (NITROSTAT) 0.4 MG SL tablet Place 1 tablet under the tongue every 5 (five) minutes as needed for chest pain.  04/29/18   [provider]  omega-3 acid ethyl esters (LOVAZA) 1 g capsule Take 1 g by mouth daily.    [provider]  ranolazine (RANEXA) 1000 MG SR tablet Take 1 tablet (1,000 mg total) by mouth 2 (two) times daily. 02/07/21   Revankar, Aundra Dubin, MD  rosuvastatin (CRESTOR) 5 MG tablet Take 1 tablet (5 mg total) by mouth daily. On Monday, Wednesday, and Friday 12/30/20   Revankar, Aundra Dubin, MD  sildenafil (VIAGRA) 100 MG tablet Take 50 mg by mouth daily as needed for erectile  dysfunction.     [provider]  traZODone (DESYREL) 100 MG tablet Take 100 mg by mouth daily in the afternoon. 10/27/20   [provider]    Allergies    Atorvastatin and Erythromycin  Review of Systems   Review of Systems  Constitutional:  Negative for chills and fever.  HENT:  Negative for sore throat.   Gastrointestinal:  Positive for vomiting. Negative for abdominal pain.  All other systems reviewed and are negative.  Physical Exam Updated Vital Signs BP 130/79   Pulse 78   Temp 98.2 F (36.8 C) (Oral)   Resp 17   Ht 5\' 10"  (1.778 m)   Wt 78.5 kg   SpO2 98%   BMI 24.82 kg/m   Physical Exam Vitals and nursing note reviewed.  Constitutional:      Appearance: He is well-developed.  HENT:     Head: Normocephalic and atraumatic.     Nose: No congestion or rhinorrhea.     Mouth/Throat:     Mouth: Mucous membranes are moist.     Pharynx: Oropharynx is clear.  Eyes:     Pupils: Pupils are equal, round, and reactive to light.  Cardiovascular:     Rate and Rhythm: Normal rate.  Pulmonary:     Effort: Pulmonary effort is normal. No respiratory distress.  Abdominal:     General: There is no distension.  Musculoskeletal:        General: Normal range of motion.     Cervical back: Normal range of motion.  Skin:    General: Skin is warm and dry.     Coloration: Skin is not jaundiced or pale.  Neurological:     General: No focal deficit present.     Mental Status: He is alert.    ED Results / Procedures / Treatments   Labs (all labs ordered are listed, but only abnormal results are displayed) Labs Reviewed  CBC WITH DIFFERENTIAL/PLATELET - Abnormal; Notable for the following components:      Result Value   WBC 10.8 (*)    RBC 4.11 (*)    HCT 38.4 (*)    Neutro Abs 8.1 (*)    All other components within normal limits  COMPREHENSIVE METABOLIC PANEL - Abnormal; Notable for the following components:   Potassium 3.4 (*)    Glucose, Bld 51 (*)     BUN 7 (*)  All other components within normal limits  CBG MONITORING, ED - Abnormal; Notable for the following components:   Glucose-Capillary 42 (*)    All other components within normal limits  CBG MONITORING, ED - Abnormal; Notable for the following components:   Glucose-Capillary 112 (*)    All other components within normal limits  CBG MONITORING, ED - Abnormal; Notable for the following components:   Glucose-Capillary 100 (*)    All other components within normal limits  URINALYSIS, ROUTINE W REFLEX MICROSCOPIC  CBG MONITORING, ED    EKG None  Radiology No results found.  Procedures Procedures   Medications Ordered in ED Medications  ondansetron (ZOFRAN-ODT) disintegrating tablet 8 mg (8 mg Oral Given 04/27/21 2346)    ED Course  I have reviewed the triage vital signs and the nursing notes.  Pertinent labs & imaging results that were available during my care of the patient were reviewed by me and considered in my medical decision making (see chart for details).    MDM Rules/Calculators/A&P                          Had an episode of hypoglcyemia here but tolerating PO and persistently euglycemic without repeat emesis. Labs reassuring. Stable for discharge.   Final Clinical Impression(s) / ED Diagnoses Final diagnoses:  Hypoglycemia  Non-intractable vomiting with nausea, unspecified vomiting type    Rx / DC Orders ED Discharge Orders          Ordered    ondansetron (ZOFRAN ODT) 4 MG disintegrating tablet        04/28/21 0148             Hallee Mckenny, Barbara CowerJason, MD 04/28/21 [phone]

## 2021-04-28 NOTE — ED Notes (Signed)
Rechecked CBG 112. Pt. Is resting and A/O x4, vitals within normal range.

## 2021-07-07 ENCOUNTER — Ambulatory Visit (HOSPITAL_COMMUNITY)
Admission: EM | Admit: 2021-07-07 | Discharge: 2021-07-07 | Disposition: A | Discharge status: Home / Self Care | Payer: Medicare Other | Attending: Emergency Medicine | Admitting: Emergency Medicine

## 2021-07-07 ENCOUNTER — Encounter (HOSPITAL_COMMUNITY): Payer: Self-pay | Admitting: Emergency Medicine

## 2021-07-07 ENCOUNTER — Other Ambulatory Visit: Payer: Self-pay

## 2021-07-07 DIAGNOSIS — R21 Rash and other nonspecific skin eruption: Secondary | ICD-10-CM | POA: Diagnosis not present

## 2021-07-07 MED ORDER — TRIAMCINOLONE ACETONIDE 0.1 % EX CREA: 1.0000 "application " | TOPICAL_CREAM | Freq: Two times a day (BID) | CUTANEOUS | 0 refills | Status: DC

## 2021-07-07 NOTE — ED Provider Notes (Signed)
[facility]    CSN: [id number] Arrival date & time: 07/07/21  1156      History   Chief Complaint Chief Complaint  Patient presents with   Rash    HPI Mark Shah is a 65 y.o. male.   Patient here for evaluation of rash on bilateral lower extremities that started yesterday.  Reports rash is itchy.  Reports rash primarily located on ankles and lower legs.  Denies any new soaps, lotions, or detergents.  Patient does report being outside but denies being in any heavy brush.  Reports having a dog but states that the dog is an inside pet.  No other family members have similar symptoms.  Has not taken any OTC medications or treatments.  Denies any trauma, injury, or other precipitating event.  Denies any specific alleviating or aggravating factors.  Denies any fevers, chest pain, shortness of breath, N/V/D, numbness, tingling, weakness, abdominal pain, or headaches.    The history is provided by the patient.  Rash  Past Medical History:  Diagnosis Date   Adhesive capsulitis    in shoulders   Atherosclerotic heart disease of native coronary artery without angina pectoris 02/06/2021   Atrial fibrillation with RVR (HCC) 01/03/2019   Atrial flutter, paroxysmal (HCC) 01/07/2019   CAD (coronary artery disease), native coronary artery 05/14/2018   Cath 05/13/18 LM normal, ostial LAD 25%, 95% prox LAD, 70% prox diag, 40% mid LAD, 30% prox circ, irregularities RCA  2.75 x 12 mm Synergy stent to prox LAD post dil to 2.9 and 2.25 x 12 mm Synergy stent post dial to 2.4 Dr. Herbie Baltimore    Chronic maxillary sinusitis 02/06/2021   Deviated septum 10/06/2019   Diabetes mellitus without complication (HCC)    Diverticulitis    Dyslipidemia    ED (erectile dysfunction)    ED (erectile dysfunction) of organic origin 02/06/2021   Essential hypertension 05/13/2018   Gastroesophageal reflux disease without esophagitis 10/06/2019   Globus pharyngeus 10/06/2019   Hearing loss of left ear 01/12/2020   HTN  (hypertension)    Hypercholesteremia 01/03/2019   Hyperglycemia due to type 1 diabetes mellitus (HCC) 05/13/2018   Hyperlipidemia    Hyperlipidemia due to type 1 diabetes mellitus (HCC) 05/13/2018   Hyperlipidemia, unspecified    Hypothyroid 08/28/2013   Hypothyroidism    Insomnia 02/06/2021   Left lower quadrant pain 02/06/2021   Left-sided tinnitus 01/12/2020   Long term (current) use of insulin (HCC) 02/06/2021   Low back pain 02/06/2021   Mixed dyslipidemia 01/07/2019   Nasal turbinate hypertrophy 10/06/2019   Obstructive sleep apnea syndrome 02/06/2021   Overweight 02/06/2021   Peripheral neuropathy 02/06/2021   PONV (postoperative nausea and vomiting)    Pure hypercholesterolemia 02/06/2021   Reactive depression 02/06/2021   Rhinitis, chronic 10/06/2019   Type 1 diabetes mellitus (HCC) 08/28/2013   Typical atrial flutter (HCC)    Unspecified abnormal finding in specimens from other organs, systems and tissues 02/06/2021    Patient Active Problem List   Diagnosis Date Noted   Insomnia 02/06/2021   Left lower quadrant pain 02/06/2021   Long term (current) use of insulin (HCC) 02/06/2021   Low back pain 02/06/2021   Overweight 02/06/2021   Peripheral neuropathy 02/06/2021   Reactive depression 02/06/2021   Obstructive sleep apnea syndrome 02/06/2021   Unspecified abnormal finding in specimens from other organs, systems and tissues 02/06/2021   Atherosclerotic heart disease of native coronary artery without angina pectoris 02/06/2021   ED (erectile dysfunction) of organic  origin 02/06/2021   Pure hypercholesterolemia 02/06/2021   Chronic maxillary sinusitis 02/06/2021   Adhesive capsulitis    Diabetes mellitus without complication (HCC)    Dyslipidemia    ED (erectile dysfunction)    HTN (hypertension)    Hyperlipidemia, unspecified    Hypothyroidism    PONV (postoperative nausea and vomiting)    Hearing loss of left ear 01/12/2020   Left-sided tinnitus 01/12/2020   Deviated septum 10/06/2019    Gastroesophageal reflux disease without esophagitis 10/06/2019   Globus pharyngeus 10/06/2019   Nasal turbinate hypertrophy 10/06/2019   Rhinitis, chronic 10/06/2019   Typical atrial flutter (HCC)    Mixed dyslipidemia 01/07/2019   Atrial flutter, paroxysmal (HCC) 01/07/2019   Atrial fibrillation with RVR (HCC) 01/03/2019   Hypercholesteremia 01/03/2019   CAD (coronary artery disease), native coronary artery 05/14/2018   Essential hypertension 05/13/2018   Hyperglycemia due to type 1 diabetes mellitus (HCC) 05/13/2018   Type 1 diabetes mellitus (HCC) 08/28/2013   Diverticulitis 08/28/2013   Hypothyroid 08/28/2013    Past Surgical History:  Procedure Laterality Date   A-FLUTTER ABLATION N/A 08/28/2019   Procedure: A-FLUTTER ABLATION;  Surgeon: Regan Lemming, MD;  [facility];  Service: Cardiovascular;  Laterality: N/A;   BACK SURGERY     CERVICAL DISC SURGERY     CORONARY STENT INTERVENTION N/A 05/13/2018   Procedure: CORONARY STENT INTERVENTION;  Surgeon: Marykay Lex, MD;  Location[facility] CV LAB;  Service: Cardiovascular;  Laterality: N/A;   left hand     LEFT HEART CATH AND CORONARY ANGIOGRAPHY N/A 05/13/2018   Procedure: LEFT HEART CATH AND CORONARY ANGIOGRAPHY;  Surgeon: Marykay Lex, MD;  Location: [facility] INVASIVE CV LAB;  Service: Cardiovascular;  Laterality: N/A;       Home Medications    Prior to Admission medications   Medication Sig Start Date End Date Taking? Authorizing Provider  triamcinolone cream (KENALOG) 0.1 % Apply 1 application topically 2 (two) times daily. 07/07/21  Yes Ivette Loyal, NP  clopidogrel (PLAVIX) 75 MG tablet Take 75 mg by mouth daily.    [provider]  fluticasone (FLONASE) 50 MCG/ACT nasal spray Place 1 spray into both nostrils daily as needed for allergies.  12/28/15   [provider]  Glucagon HCl 1 MG SOLR Inject 1 mg into the skin once as needed (blood sugar).    [provider]  insulin glargine (LANTUS) 100 UNIT/ML Solostar Pen Inject 11 Units into the skin as needed. For blood sugar only if insulin pump cannot be used    [provider]  insulin lispro (HUMALOG) 100 UNIT/ML injection Inject 20 Units into the skin 3 (three) times daily as needed for high blood sugar (if insulin pump malfunction).    [provider]  levothyroxine (SYNTHROID) 175 MCG tablet Take 175 mcg by mouth every other day.     [provider]  levothyroxine (SYNTHROID) 200 MCG tablet Take 200 mcg by mouth every other day.     [provider]  loratadine (CLARITIN) 10 MG tablet Take 10 mg by mouth daily as needed for allergies.    [provider]  LYUMJEV 100 UNIT/ML SOLN Inject 60 Units into the skin daily. Via insulin pump 01/26/21   [provider]  nitroGLYCERIN (NITROSTAT) 0.4 MG SL tablet Place 1 tablet under the tongue every 5 (five) minutes as needed for chest pain.  04/29/18   [provider]  omega-3 acid ethyl esters (LOVAZA) 1  g capsule Take 1 g by mouth daily.    [provider]  ondansetron (ZOFRAN ODT) 4 MG disintegrating tablet 4mg  ODT q4 hours prn nausea/vomit 04/28/21   Mesner, Barbara CowerJason, MD  ranolazine (RANEXA) 1000 MG SR tablet Take 1 tablet (1,000 mg total) by mouth 2 (two) times daily. 02/07/21   Revankar, Aundra Dubinajan R, MD  rosuvastatin (CRESTOR) 5 MG tablet Take 1 tablet (5 mg total) by mouth daily. On Monday, Wednesday, and Friday 12/30/20   Revankar, Aundra Dubinajan R, MD  sildenafil (VIAGRA) 100 MG tablet Take 50 mg by mouth daily as needed for erectile dysfunction.     [provider]  traZODone (DESYREL) 100 MG tablet Take 100 mg by mouth daily in the afternoon. 10/27/20   [provider]    Family History Family History  Problem Relation Age of Onset   Hyperlipidemia Mother    Asthma Mother    Asthma Father    COPD Father    Heart Problems Father        bicuspid aortic valve replacement   Other  Brother        orthopedic problems   Lung cancer Maternal Grandfather    Heart Problems Paternal Grandmother        aortic v alve replacement   Hyperlipidemia Paternal Grandfather    Stroke Paternal Grandfather    CVA Paternal Grandfather     Social History Social History   Tobacco Use   Smoking status: Former   Smokeless tobacco: Never  Substance Use Topics   Alcohol use: Yes   Drug use: No     Allergies   Atorvastatin and Erythromycin   Review of Systems Review of Systems  Skin:  Positive for rash.  All other systems reviewed and are negative.   Physical Exam Triage Vital Signs ED Triage Vitals  Enc Vitals Group     BP 07/07/21 1220 136/78     Pulse Rate 07/07/21 1220 74     Resp 07/07/21 1220 16     Temp 07/07/21 1220 98.7 F (37.1 C)     Temp Source 07/07/21 1220 Oral     SpO2 07/07/21 1220 100 %     Weight --      Height --      Head Circumference --      Peak Flow --      Pain Score 07/07/21 1218 2     Pain Loc --      Pain Edu? --      Excl. in GC? --    No data found.  Updated Vital Signs BP 136/78 (BP Location: Left Arm)   Pulse 74   Temp 98.7 F (37.1 C) (Oral)   Resp 16   SpO2 100%   Visual Acuity Right Eye Distance:   Left Eye Distance:   Bilateral Distance:    Right Eye Near:   Left Eye Near:    Bilateral Near:     Physical Exam Vitals and nursing note reviewed.  Constitutional:      General: He is not in acute distress.    Appearance: Normal appearance. He is not ill-appearing, toxic-appearing or diaphoretic.  HENT:     Head: Normocephalic and atraumatic.  Eyes:     Conjunctiva/sclera: Conjunctivae normal.  Cardiovascular:     Rate and Rhythm: Normal rate.     Pulses: Normal pulses.  Pulmonary:     Effort: Pulmonary effort is normal.  Abdominal:     General: Abdomen is flat.  Musculoskeletal:        General: Normal range of motion.     Cervical back: Normal range of motion.  Skin:    General: Skin is warm and  dry.     Findings: Rash present. Rash is papular (diffuse across bilateral lower extremities).  Neurological:     General: No focal deficit present.     Mental Status: He is alert and oriented to person, place, and time.  Psychiatric:        Mood and Affect: Mood normal.     UC Treatments / Results  Labs (all labs ordered are listed, but only abnormal results are displayed) Labs Reviewed - No data to display  EKG   Radiology No results found.  Procedures Procedures (including critical care time)  Medications Ordered in UC Medications - No data to display  Initial Impression / Assessment and Plan / UC Course  I have reviewed the triage vital signs and the nursing notes.  Pertinent labs & imaging results that were available during my care of the patient were reviewed by me and considered in my medical decision making (see chart for details).    Assessment negative for red flags or concerns.  Rash to lower extremities.  Allergic dermatitis versus fleabites.  Will treat with triamcinolone cream twice a day as needed.  May take Benadryl as needed for severe itching.  Recommend follow-up with primary care or dermatology if rash does not improve. Final Clinical Impressions(s) / UC Diagnoses   Final diagnoses:  Rash     Discharge Instructions      Use the triamcinolone cream twice a day as needed for itch. You can also take Benadryl as needed for severe itching.  Return or go to the Emergency Department if symptoms worsen or do not improve in the next few days.  Follow-up with your primary care provider or dermatology if rash does not improve.     ED Prescriptions     Medication Sig Dispense Auth. Provider   triamcinolone cream (KENALOG) 0.1 % Apply 1 application topically 2 (two) times daily. 30 g Ivette Loyal, NP      PDMP not reviewed this encounter.   Ivette Loyal, NP 07/07/21 1248

## 2021-07-07 NOTE — ED Triage Notes (Signed)
Pt reports red, itching rash on bilat legs that started last night. Denies new soap, lotion, detergents,

## 2021-07-07 NOTE — Discharge Instructions (Addendum)
Use the triamcinolone cream twice a day as needed for itch. You can also take Benadryl as needed for severe itching.  Return or go to the Emergency Department if symptoms worsen or do not improve in the next few days.  Follow-up with your primary care provider or dermatology if rash does not improve.

## 2021-08-29 ENCOUNTER — Telehealth: Payer: Self-pay | Admitting: Cardiology

## 2021-08-29 MED ORDER — RANOLAZINE ER 1000 MG PO TB12
1000.0000 mg | ORAL_TABLET | Freq: Two times a day (BID) | ORAL | 3 refills | Status: DC
Start: 2021-08-29 — End: 2022-10-01

## 2021-08-29 MED ORDER — RANOLAZINE ER 1000 MG PO TB12: 1000.0000 mg | ORAL_TABLET | Freq: Two times a day (BID) | ORAL | 3 refills | Status: DC

## 2021-08-29 NOTE — Telephone Encounter (Signed)
*  STAT* If patient is at the pharmacy, call can be transferred to refill team.   1. Which medications need to be refilled? (please list name of each medication and dose if known)  ranolazine (RANEXA) 1000 MG SR tablet  2. Which pharmacy/location (including street and city if local pharmacy) is medication to be sent to? [facility]  [address] [place], [place] [phone]  3. Do they need a 30 day or 90 day supply?  90 day supply

## 2021-08-29 NOTE — Telephone Encounter (Signed)
Refill sent in per request.  

## 2021-09-01 DIAGNOSIS — E785 Hyperlipidemia, unspecified: Secondary | ICD-10-CM | POA: Insufficient documentation

## 2021-09-04 ENCOUNTER — Other Ambulatory Visit: Payer: Self-pay

## 2021-09-04 ENCOUNTER — Encounter: Payer: Self-pay | Admitting: Cardiology

## 2021-09-04 ENCOUNTER — Ambulatory Visit (INDEPENDENT_AMBULATORY_CARE_PROVIDER_SITE_OTHER): Payer: Medicare Other | Admitting: Cardiology

## 2021-09-04 VITALS — BP 148/88 | HR 80 | Ht 70.0 in | Wt 179.0 lb

## 2021-09-04 DIAGNOSIS — E782 Mixed hyperlipidemia: Secondary | ICD-10-CM

## 2021-09-04 DIAGNOSIS — E088 Diabetes mellitus due to underlying condition with unspecified complications: Secondary | ICD-10-CM

## 2021-09-04 DIAGNOSIS — E7800 Pure hypercholesterolemia, unspecified: Secondary | ICD-10-CM | POA: Diagnosis not present

## 2021-09-04 DIAGNOSIS — I2510 Atherosclerotic heart disease of native coronary artery without angina pectoris: Secondary | ICD-10-CM

## 2021-09-04 HISTORY — DX: Diabetes mellitus due to underlying condition with unspecified complications: E08.8

## 2021-09-04 MED ORDER — NITROGLYCERIN 0.4 MG SL SUBL: 0.4000 mg | SUBLINGUAL_TABLET | SUBLINGUAL | 3 refills | Status: DC | PRN

## 2021-09-04 NOTE — Progress Notes (Signed)
Cardiology Office Note:    Date:  09/04/2021   ID:  Mark Shah, DOB [birth year], MRN [id number]  PCP:  Catha Gosselin, MD  Cardiologist:  Garwin Brothers, MD   Referring MD: Catha Gosselin, MD    ASSESSMENT:    1. Coronary artery disease involving native coronary artery of native heart without angina pectoris   2. Mixed dyslipidemia   3. Pure hypercholesterolemia   4. Diabetes mellitus due to underlying condition with unspecified complications (HCC)    PLAN:    In order of problems listed above:  Coronary artery disease: Secondary prevention stressed with the patient.  Importance of compliance with diet medication stressed any vocalized understanding. Essential hypertension: Blood pressure stable and diet was emphasized.  Lifestyle modification urged.  Salt intake issues and exercise stressed.  I told him to walk at least half an hour a day 5 times a week and he promises to do so. Mixed dyslipidemia: Diet emphasized.  Lipids reviewed he will be back in the next few days.  Fasting liver lipid check. Diabetes mellitus: Managed by primary care.  I told him the importance of diabetes control for cardiovascular disease prevention and he understands. Patient will be seen in follow-up appointment in 6 months or earlier if the patient has any concerns    Medication Adjustments/Labs and Tests Ordered: Current medicines are reviewed at length with the patient today.  Concerns regarding medicines are outlined above.  Orders Placed This Encounter  Procedures   Basic metabolic panel   Hepatic function panel   Lipid panel   Meds ordered this encounter  Medications   nitroGLYCERIN (NITROSTAT) 0.4 MG SL tablet    Sig: Place 1 tablet (0.4 mg total) under the tongue every 5 (five) minutes as needed for chest pain.    Dispense:  25 tablet    Refill:  3     Chief Complaint  Patient presents with   Follow-up     History of Present Illness:    Mark Shah is a 65 y.o. male.   Patient has past medical history of coronary artery disease, essential hypertension, mixed dyslipidemia and diabetes mellitus.  He mentions to me that he walks about 2 miles a day without any problems.  No chest pain orthopnea or PND.  At the time of my evaluation, the patient is alert awake oriented and in no distress.  He is happy about his overall condition.  Past Medical History:  Diagnosis Date   Adhesive capsulitis    in shoulders   Atherosclerotic heart disease of native coronary artery without angina pectoris 02/06/2021   Atrial fibrillation with RVR (HCC) 01/03/2019   Atrial flutter, paroxysmal (HCC) 01/07/2019   CAD (coronary artery disease), native coronary artery 05/14/2018   Cath 05/13/18 LM normal, ostial LAD 25%, 95% prox LAD, 70% prox diag, 40% mid LAD, 30% prox circ, irregularities RCA  2.75 x 12 mm Synergy stent to prox LAD post dil to 2.9 and 2.25 x 12 mm Synergy stent post dial to 2.4 Dr. Herbie Baltimore    Chronic maxillary sinusitis 02/06/2021   Deviated septum 10/06/2019   Diabetes mellitus without complication (HCC)    Diverticulitis    Dyslipidemia    ED (erectile dysfunction)    ED (erectile dysfunction) of organic origin 02/06/2021   Essential hypertension 05/13/2018   Gastroesophageal reflux disease without esophagitis 10/06/2019   Globus pharyngeus 10/06/2019   Hearing loss of left ear 01/12/2020   HTN (hypertension)    Hypercholesteremia 01/03/2019   Hyperglycemia  due to type 1 diabetes mellitus (HCC) 05/13/2018   Hyperlipidemia    Hyperlipidemia due to type 1 diabetes mellitus (HCC) 05/13/2018   Hyperlipidemia, unspecified    Hypothyroid 08/28/2013   Hypothyroidism    Insomnia 02/06/2021   Left lower quadrant pain 02/06/2021   Left-sided tinnitus 01/12/2020   Long term (current) use of insulin (HCC) 02/06/2021   Low back pain 02/06/2021   Mixed dyslipidemia 01/07/2019   Nasal turbinate hypertrophy 10/06/2019   Obstructive sleep apnea syndrome 02/06/2021   Overweight 02/06/2021   Peripheral  neuropathy 02/06/2021   PONV (postoperative nausea and vomiting)    Pure hypercholesterolemia 02/06/2021   Reactive depression 02/06/2021   Rhinitis, chronic 10/06/2019   Type 1 diabetes mellitus (HCC) 08/28/2013   Typical atrial flutter (HCC)    Unspecified abnormal finding in specimens from other organs, systems and tissues 02/06/2021    Past Surgical History:  Procedure Laterality Date   A-FLUTTER ABLATION N/A 08/28/2019   Procedure: A-FLUTTER ABLATION;  Surgeon: Regan Lemming, MD;  Location: [facility];  Service: Cardiovascular;  Laterality: N/A;   BACK SURGERY     CERVICAL DISC SURGERY     CORONARY STENT INTERVENTION N/A 05/13/2018   Procedure: CORONARY STENT INTERVENTION;  Surgeon: Marykay Lex, MD;  Location: [facility] INVASIVE CV LAB;  Service: Cardiovascular;  Laterality: N/A;   left hand     LEFT HEART CATH AND CORONARY ANGIOGRAPHY N/A 05/13/2018   Procedure: LEFT HEART CATH AND CORONARY ANGIOGRAPHY;  Surgeon: Marykay Lex, MD;  Location: [facility] INVASIVE CV LAB;  Service: Cardiovascular;  Laterality: N/A;    Current Medications: Current Meds  Medication Sig   clopidogrel (PLAVIX) 75 MG tablet Take 75 mg by mouth daily.   fluticasone (FLONASE) 50 MCG/ACT nasal spray Place 1 spray into both nostrils daily as needed for allergies.    Glucagon HCl 1 MG SOLR Inject 1 mg into the skin once as needed (blood sugar).   insulin glargine (LANTUS) 100 UNIT/ML Solostar Pen Inject 11 Units into the skin as needed. For blood sugar only if insulin pump cannot be used   insulin lispro (HUMALOG) 100 UNIT/ML injection Inject 20 Units into the skin 3 (three) times daily as needed for high blood sugar (if insulin pump malfunction).   levothyroxine (SYNTHROID) 175 MCG tablet Take 175 mcg by mouth every other day.    loratadine (CLARITIN) 10 MG tablet Take 10 mg by mouth daily as needed for allergies.   LYUMJEV 100 UNIT/ML SOLN Inject 60 Units into the skin daily. Via insulin pump   omega-3 acid  ethyl esters (LOVAZA) 1 g capsule Take 1 g by mouth daily.   ondansetron (ZOFRAN ODT) 4 MG disintegrating tablet 4mg  ODT q4 hours prn nausea/vomit   ranolazine (RANEXA) 1000 MG SR tablet Take 1 tablet (1,000 mg total) by mouth 2 (two) times daily.   rosuvastatin (CRESTOR) 5 MG tablet Take 1 tablet (5 mg total) by mouth daily. On Monday, Wednesday, and Friday   sildenafil (VIAGRA) 100 MG tablet Take 50 mg by mouth daily as needed for erectile dysfunction.    traZODone (DESYREL) 100 MG tablet Take 100 mg by mouth daily in the afternoon.   triamcinolone cream (KENALOG) 0.1 % Apply 1 application topically 2 (two) times daily.   [DISCONTINUED] nitroGLYCERIN (NITROSTAT) 0.4 MG SL tablet Place 1 tablet under the tongue every 5 (five) minutes as needed for chest pain.      Allergies:   Atorvastatin and Erythromycin   Social  History   Socioeconomic History   Marital status: Married    Spouse name: Not on file   Number of children: Not on file   Years of education: Not on file   Highest education level: Not on file  Occupational History   Not on file  Tobacco Use   Smoking status: Former   Smokeless tobacco: Never  Substance and Sexual Activity   Alcohol use: Yes   Drug use: No   Sexual activity: Yes  Other Topics Concern   Not on file  Social History Narrative   Not on file   Social Determinants of Health   Financial Resource Strain: Not on file  Food Insecurity: Not on file  Transportation Needs: Not on file  Physical Activity: Not on file  Stress: Not on file  Social Connections: Not on file     Family History: The patient's family history includes Asthma in his father and mother; COPD in his father; CVA in his paternal grandfather; Heart Problems in his father and paternal grandmother; Hyperlipidemia in his mother and paternal grandfather; Lung cancer in his maternal grandfather; Other in his brother; Stroke in his paternal grandfather.  ROS:   Please see the history of  present illness.    All other systems reviewed and are negative.  EKGs/Labs/Other Studies Reviewed:    The following studies were reviewed today: Study Highlights  The patient walked for 8 minutes on a standard Bruce protocol treadmill test. The peak heart rate was 136 which is 88% predicted maximal heart rate. He had a right bundle branch block at baseline. There were no ST or T wave changes to suggest ischemia. The BP response was hypertensive . Nuclear stress EF: 62%. The left ventricular ejection fraction is normal (55-65%). This is a low risk study. There is no evidence of ischemia and no evidence of previous infarction. Normal exercise Myoview.   Recent Labs: 04/27/2021: ALT 18; BUN 7; Creatinine, Ser 1.13; Hemoglobin 13.6; Platelets 237; Potassium 3.4; Sodium 138  Recent Lipid Panel    Component Value Date/Time   CHOL 198 12/29/2020 1013   TRIG 101 12/29/2020 1013   HDL 56 12/29/2020 1013   CHOLHDL 3.5 12/29/2020 1013   LDLCALC 124 (H) 12/29/2020 1013    Physical Exam:    VS:  BP (!) 148/88 (BP Location: Right Arm, Patient Position: Sitting, Cuff Size: Normal)   Pulse 80   Ht 5\' 10"  (1.778 m)   Wt 179 lb (81.2 kg)   SpO2 97%   BMI 25.68 kg/m     Wt Readings from Last 3 Encounters:  09/04/21 179 lb (81.2 kg)  04/27/21 173 lb (78.5 kg)  02/07/21 184 lb 1.3 oz (83.5 kg)     GEN: Patient is in no acute distress HEENT: Normal NECK: No JVD; No carotid bruits LYMPHATICS: No lymphadenopathy CARDIAC: Hear sounds regular, 2/6 systolic murmur at the apex. RESPIRATORY:  Clear to auscultation without rales, wheezing or rhonchi  ABDOMEN: Soft, non-tender, non-distended MUSCULOSKELETAL:  No edema; No deformity  SKIN: Warm and dry NEUROLOGIC:  Alert and oriented x 3 PSYCHIATRIC:  Normal affect   Signed, 04/09/21, MD  09/04/2021 11:44 AM    [facility]

## 2021-09-04 NOTE — Patient Instructions (Signed)

## 2021-09-09 ENCOUNTER — Emergency Department (HOSPITAL_COMMUNITY)
Admission: EM | Admit: 2021-09-09 | Discharge: 2021-09-09 | Disposition: A | Discharge status: Home / Self Care | Payer: Medicare Other | Attending: Emergency Medicine | Admitting: Emergency Medicine

## 2021-09-09 ENCOUNTER — Other Ambulatory Visit: Payer: Self-pay

## 2021-09-09 ENCOUNTER — Encounter (HOSPITAL_COMMUNITY): Payer: Self-pay | Admitting: Emergency Medicine

## 2021-09-09 ENCOUNTER — Emergency Department (HOSPITAL_COMMUNITY): Payer: Medicare Other

## 2021-09-09 DIAGNOSIS — R079 Chest pain, unspecified: Secondary | ICD-10-CM

## 2021-09-09 DIAGNOSIS — E1042 Type 1 diabetes mellitus with diabetic polyneuropathy: Secondary | ICD-10-CM | POA: Diagnosis not present

## 2021-09-09 DIAGNOSIS — Z7902 Long term (current) use of antithrombotics/antiplatelets: Secondary | ICD-10-CM | POA: Diagnosis not present

## 2021-09-09 DIAGNOSIS — Z794 Long term (current) use of insulin: Secondary | ICD-10-CM | POA: Insufficient documentation

## 2021-09-09 DIAGNOSIS — Z20822 Contact with and (suspected) exposure to covid-19: Secondary | ICD-10-CM | POA: Diagnosis not present

## 2021-09-09 DIAGNOSIS — I480 Paroxysmal atrial fibrillation: Secondary | ICD-10-CM | POA: Diagnosis not present

## 2021-09-09 DIAGNOSIS — I2510 Atherosclerotic heart disease of native coronary artery without angina pectoris: Secondary | ICD-10-CM | POA: Insufficient documentation

## 2021-09-09 DIAGNOSIS — Z955 Presence of coronary angioplasty implant and graft: Secondary | ICD-10-CM | POA: Diagnosis not present

## 2021-09-09 DIAGNOSIS — I10 Essential (primary) hypertension: Secondary | ICD-10-CM | POA: Diagnosis not present

## 2021-09-09 DIAGNOSIS — E039 Hypothyroidism, unspecified: Secondary | ICD-10-CM | POA: Insufficient documentation

## 2021-09-09 DIAGNOSIS — Z79899 Other long term (current) drug therapy: Secondary | ICD-10-CM | POA: Diagnosis not present

## 2021-09-09 DIAGNOSIS — Z7901 Long term (current) use of anticoagulants: Secondary | ICD-10-CM | POA: Diagnosis not present

## 2021-09-09 DIAGNOSIS — Z87891 Personal history of nicotine dependence: Secondary | ICD-10-CM | POA: Insufficient documentation

## 2021-09-09 LAB — BASIC METABOLIC PANEL
Anion gap: 9 (ref 5–15)
BUN: 9 mg/dL (ref 8–23)
CO2: 25 mmol/L (ref 22–32)
Calcium: 9 mg/dL (ref 8.9–10.3)
Chloride: 102 mmol/L (ref 98–111)
Creatinine, Ser: 1.19 mg/dL (ref 0.61–1.24)
GFR, Estimated: >60 mL/min (ref >60)
Glucose, Bld: 105 mg/dL — ABNORMAL HIGH (ref 70–99)
Potassium: 3.8 mmol/L (ref 3.5–5.1)
Sodium: 136 mmol/L (ref 135–145)

## 2021-09-09 LAB — CBC
HCT: 40.6 % (ref 39.0–52.0)
Hemoglobin: 14.4 g/dL (ref 13.0–17.0)
MCH: 33.7 pg (ref 26.0–34.0)
MCHC: 35.5 g/dL (ref 30.0–36.0)
MCV: 95.1 fL (ref 80.0–100.0)
Platelets: 225 10*3/uL (ref 150–400)
RBC: 4.27 MIL/uL (ref 4.22–5.81)
RDW: 11.6 % (ref 11.5–15.5)
WBC: 7.2 10*3/uL (ref 4.0–10.5)
nRBC: 0 % (ref 0.0–0.2)

## 2021-09-09 LAB — RESP PANEL BY RT-PCR (FLU A&B, COVID) ARPGX2
Influenza A by PCR: NEGATIVE
Influenza B by PCR: NEGATIVE
SARS Coronavirus 2 by RT PCR: NEGATIVE

## 2021-09-09 LAB — TROPONIN I (HIGH SENSITIVITY)
Troponin I (High Sensitivity): 5 ng/L (ref <18)
Troponin I (High Sensitivity): 5 ng/L (ref <18)

## 2021-09-09 MED ORDER — APIXABAN 5 MG PO TABS: 5.0000 mg | ORAL_TABLET | Freq: Two times a day (BID) | ORAL | 0 refills | Status: DC

## 2021-09-09 NOTE — ED Provider Notes (Signed)
[facility] EMERGENCY DEPARTMENT Provider Note   CSN: [id number] Arrival date & time: 09/09/21  0321     History Chief Complaint  Patient presents with   Chest Pain    Mark Shah is a 65 y.o. male.  The history is provided by the patient, medical records and the spouse.  Chest Pain Mark Shah is a 65 y.o. male who presents to the Emergency Department complaining of chest pain. He presents the emergency department accompanied by his wife for evaluation of chest pain. He woke up about 145 in the morning and felt like he was in a fib with RVR. He did place on his Apple Watch which confirmed rapid atrial fibrillation. Shortly after he woke with atrial fibrillation he developed central severe chest pain. Pain was described as dull and nonradiating. He took two nitroglycerin and his pain began to slightly subside and then his rhythm converted at 215 to sinus rhythm. He felt like he was going to pass out during the episode. On recordings from his Apple Watches states heart rate of 139 on one of his EKG is was captured. He also reports upper respiratory infection symptoms for the last two days with poor sleep, sore throat, sinus congestion and cough. He did take a Sudafed prior to going to bed last night. He has a history of atrial fibrillation is status post ablation in 2020. He is not currently on anticoagulants. He does take Plavix for history of coronary artery disease. No additional recent illnesses.    Past Medical History:  Diagnosis Date   Adhesive capsulitis    in shoulders   Atherosclerotic heart disease of native coronary artery without angina pectoris 02/06/2021   Atrial fibrillation with RVR (HCC) 01/03/2019   Atrial flutter, paroxysmal ([place]) 01/07/2019   CAD (coronary artery disease), native coronary artery 05/14/2018   Cath 05/13/18 LM normal, ostial LAD 25%, 95% prox LAD, 70% prox diag, 40% mid LAD, 30% prox circ, irregularities RCA  2.75 x 12 mm Synergy  stent to prox LAD post dil to 2.9 and 2.25 x 12 mm Synergy stent post dial to 2.4 Dr. Ellyn Hack    Chronic maxillary sinusitis 02/06/2021   Deviated septum 10/06/2019   Diabetes mellitus without complication ([place])    Diverticulitis    Dyslipidemia    ED (erectile dysfunction)    ED (erectile dysfunction) of organic origin 02/06/2021   Essential hypertension 05/13/2018   Gastroesophageal reflux disease without esophagitis 10/06/2019   Globus pharyngeus 10/06/2019   Hearing loss of left ear 01/12/2020   HTN (hypertension)    Hypercholesteremia 01/03/2019   Hyperglycemia due to type 1 diabetes mellitus ([place]) 05/13/2018   Hyperlipidemia    Hyperlipidemia due to type 1 diabetes mellitus ([place]) 05/13/2018   Hyperlipidemia, unspecified    Hypothyroid 08/28/2013   Hypothyroidism    Insomnia 02/06/2021   Left lower quadrant pain 02/06/2021   Left-sided tinnitus 01/12/2020   Long term (current) use of insulin ([place]) 02/06/2021   Low back pain 02/06/2021   Mixed dyslipidemia 01/07/2019   Nasal turbinate hypertrophy 10/06/2019   Obstructive sleep apnea syndrome 02/06/2021   Overweight 02/06/2021   Peripheral neuropathy 02/06/2021   PONV (postoperative nausea and vomiting)    Pure hypercholesterolemia 02/06/2021   Reactive depression 02/06/2021   Rhinitis, chronic 10/06/2019   Type 1 diabetes mellitus ([place]) 08/28/2013   Typical atrial flutter ([place])    Unspecified abnormal finding in specimens from other organs, systems and tissues 02/06/2021    Patient Active Problem  List   Diagnosis Date Noted   Diabetes mellitus due to underlying condition with unspecified complications ([place]) [id number]   Hyperlipidemia 09/01/2021   Insomnia 02/06/2021   Left lower quadrant pain 02/06/2021   Long term (current) use of insulin ([place]) 02/06/2021   Low back pain 02/06/2021   Overweight 02/06/2021   Peripheral neuropathy 02/06/2021   Reactive depression 02/06/2021   Obstructive sleep apnea syndrome 02/06/2021   Unspecified abnormal finding in  specimens from other organs, systems and tissues 02/06/2021   Atherosclerotic heart disease of native coronary artery without angina pectoris 02/06/2021   ED (erectile dysfunction) of organic origin 02/06/2021   Pure hypercholesterolemia 02/06/2021   Chronic maxillary sinusitis 02/06/2021   Adhesive capsulitis    Diabetes mellitus without complication ([place])    Dyslipidemia    ED (erectile dysfunction)    HTN (hypertension)    Hyperlipidemia, unspecified    Hypothyroidism    PONV (postoperative nausea and vomiting)    Hearing loss of left ear 01/12/2020   Left-sided tinnitus 01/12/2020   Deviated septum 10/06/2019   Gastroesophageal reflux disease without esophagitis 10/06/2019   Globus pharyngeus 10/06/2019   Nasal turbinate hypertrophy 10/06/2019   Rhinitis, chronic 10/06/2019   Typical atrial flutter ([place])    Mixed dyslipidemia 01/07/2019   Atrial flutter, paroxysmal ([place]) 01/07/2019   Atrial fibrillation with RVR ([place]) 01/03/2019   Hypercholesteremia 01/03/2019   CAD (coronary artery disease), native coronary artery 05/14/2018   Essential hypertension 05/13/2018   Hyperglycemia due to type 1 diabetes mellitus ([place]) 05/13/2018   Hyperlipidemia due to type 1 diabetes mellitus ([place]) 05/13/2018   Type 1 diabetes mellitus ([place]) 08/28/2013   Diverticulitis 08/28/2013   Hypothyroid 08/28/2013    Past Surgical History:  Procedure Laterality Date   A-FLUTTER ABLATION N/A 08/28/2019   Procedure: A-FLUTTER ABLATION;  Surgeon: Constance Haw, MD;  Location: [place] [facility];  Service: Cardiovascular;  Laterality: N/A;   BACK SURGERY     CERVICAL DISC SURGERY     CORONARY STENT INTERVENTION N/A 05/13/2018   Procedure: CORONARY STENT INTERVENTION;  Surgeon: Leonie Man, MD;  Location: [place] [facility];  Service: Cardiovascular;  Laterality: N/A;   left hand     LEFT HEART CATH AND CORONARY ANGIOGRAPHY N/A 05/13/2018   Procedure: LEFT HEART CATH AND CORONARY ANGIOGRAPHY;   Surgeon: Leonie Man, MD;  Location: [place] [facility];  Service: Cardiovascular;  Laterality: N/A;       Family History  Problem Relation Age of Onset   Hyperlipidemia Mother    Asthma Mother    Asthma Father    COPD Father    Heart Problems Father        bicuspid aortic valve replacement   Other Brother        orthopedic problems   Lung cancer Maternal Grandfather    Heart Problems Paternal Grandmother        aortic v alve replacement   Hyperlipidemia Paternal Grandfather    Stroke Paternal Grandfather    CVA Paternal Grandfather     Social History   Tobacco Use   Smoking status: Former   Smokeless tobacco: Never  Substance Use Topics   Alcohol use: Yes   Drug use: No    Home Medications Prior to Admission medications   Medication Sig Start Date End Date Taking? Authorizing Provider  acetaminophen (TYLENOL) 500 MG tablet Take 1,000 mg by mouth every 6 (six) hours as needed for moderate pain or headache.   Yes  [provider]  apixaban (ELIQUIS) 5 MG TABS tablet Take 1 tablet (5 mg total) by mouth 2 (two) times daily. 09/09/21 10/09/21 Yes Quintella Reichert, MD  Cholecalciferol (VITAMIN D3 PO) Take 1 tablet by mouth at bedtime.   Yes [provider]  clopidogrel (PLAVIX) 75 MG tablet Take 75 mg by mouth daily.   Yes [provider]  Cyanocobalamin (VITAMIN B-12 PO) Take 1 tablet by mouth at bedtime.   Yes [provider]  fluticasone (FLONASE) 50 MCG/ACT nasal spray Place 1 spray into both nostrils daily as needed for allergies.  12/28/15  Yes [provider]  Glucagon HCl 1 MG SOLR Inject 1 mg into the skin once as needed (low blood sugar).   Yes [provider]  insulin glargine (LANTUS) 100 UNIT/ML Solostar Pen Inject 11 Units into the skin as needed (for blood sugar if insulin pump cannot be used).   Yes [provider]  insulin lispro (HUMALOG) 100 UNIT/ML injection Inject 20 Units into the skin 3  (three) times daily as needed for high blood sugar (if insulin pump malfunction).   Yes [provider]  levothyroxine (SYNTHROID) 175 MCG tablet Take 175 mcg by mouth daily before breakfast.   Yes [provider]  loratadine (CLARITIN) 10 MG tablet Take 10 mg by mouth daily as needed for allergies.   Yes [provider]  LYUMJEV 100 UNIT/ML SOLN Inject 60 Units into the skin daily. Via insulin pump 01/26/21  Yes [provider]  MAGNESIUM PO Take 1 tablet by mouth at bedtime.   Yes [provider]  nitroGLYCERIN (NITROSTAT) 0.4 MG SL tablet Place 1 tablet (0.4 mg total) under the tongue every 5 (five) minutes as needed for chest pain. 09/04/21  Yes Revankar, Reita Cliche, MD  omega-3 acid ethyl esters (LOVAZA) 1 g capsule Take 2 g by mouth at bedtime.   Yes [provider]  ondansetron (ZOFRAN ODT) 4 MG disintegrating tablet 4mg  ODT q4 hours prn nausea/vomit Patient taking differently: Take 4 mg by mouth every 4 (four) hours as needed for nausea or vomiting. 04/28/21  Yes Mesner, Corene Cornea, MD  Pseudoephedrine HCl (SUDAFED PO) Take 2 tablets by mouth every 4 (four) hours as needed (congestion).   Yes [provider]  ranolazine (RANEXA) 1000 MG SR tablet Take 1 tablet (1,000 mg total) by mouth 2 (two) times daily. 08/29/21  Yes Revankar, Reita Cliche, MD  rosuvastatin (CRESTOR) 5 MG tablet Take 1 tablet (5 mg total) by mouth daily. On Monday, Wednesday, and Friday Patient taking differently: Take 5 mg by mouth daily. 12/30/20  Yes Revankar, Reita Cliche, MD  sildenafil (VIAGRA) 100 MG tablet Take 50 mg by mouth daily as needed for erectile dysfunction.    Yes [provider]  lisinopril (ZESTRIL) 2.5 MG tablet Take 2.5 mg by mouth daily. 09/07/21   [provider]  triamcinolone cream (KENALOG) 0.1 % Apply 1 application topically 2 (two) times daily. Patient not taking: No sig reported 07/07/21   Pearson Forster, NP    Allergies     Atorvastatin, Erythromycin, and Silicone  Review of Systems   Review of Systems  Cardiovascular:  Positive for chest pain.  All other systems reviewed and are negative.  Physical Exam Updated Vital Signs BP 125/87   Pulse 86   Temp 99.1 F (37.3 C) (Oral)   Resp 17   SpO2 96%   Physical Exam Vitals and nursing note reviewed.  Constitutional:  Appearance: He is well-developed.  HENT:     Head: Normocephalic and atraumatic.  Cardiovascular:     Rate and Rhythm: Normal rate and regular rhythm.     Heart sounds: No murmur heard. Pulmonary:     Effort: Pulmonary effort is normal. No respiratory distress.     Breath sounds: Normal breath sounds.  Abdominal:     Palpations: Abdomen is soft.     Tenderness: There is no abdominal tenderness. There is no guarding or rebound.  Musculoskeletal:        General: No swelling or tenderness.  Skin:    General: Skin is warm and dry.  Neurological:     Mental Status: He is alert and oriented to person, place, and time.  Psychiatric:        Behavior: Behavior normal.    ED Results / Procedures / Treatments   Labs (all labs ordered are listed, but only abnormal results are displayed) Labs Reviewed  BASIC METABOLIC PANEL - Abnormal; Notable for the following components:      Result Value   Glucose, Bld 105 (*)    All other components within normal limits  RESP PANEL BY RT-PCR (FLU A&B, COVID) ARPGX2  CBC  TROPONIN I (HIGH SENSITIVITY)  TROPONIN I (HIGH SENSITIVITY)    EKG EKG Interpretation  Date/Time:  Saturday September 09 2021 04:04:32 EDT Ventricular Rate:  98 PR Interval:  170 QRS Duration: 151 QT Interval:  386 QTC Calculation: 493 R Axis:   79 Text Interpretation: Sinus rhythm Left atrial enlargement Right bundle branch block Confirmed by Tilden Fossa [phone]) on 09/09/2021 4:09:26 AM  Radiology DG Chest 2 View  Result Date: 09/09/2021 CLINICAL DATA:  Chest pain EXAM: CHEST - 2 VIEW COMPARISON:  04/29/2018  chest radiograph. FINDINGS: Stable cardiomediastinal silhouette with normal heart size. No pneumothorax. No pleural effusion. Lungs appear clear, with no acute consolidative airspace disease and no pulmonary edema. IMPRESSION: No active cardiopulmonary disease. Electronically Signed   By: Delbert Phenix M.D.   On: 09/09/2021 05:20    Procedures Procedures   Medications Ordered in ED Medications - No data to display  ED Course  I have reviewed the triage vital signs and the nursing notes.  Pertinent labs & imaging results that were available during my care of the patient were reviewed by me and considered in my medical decision making (see chart for details).    MDM Rules/Calculators/A&P                         patient with history of coronary artery disease, diabetes, atrial fibrillation status post ablation here for evaluation of chest pain and atrial fibrillation recorded at home. In the emergency department his pain is resolved and he is no longer in atrial fibrillation. Troponins are negative times two. Discussed with on-call cardiologist patient's symptoms and history - given elevation is Chadvasc score it is recommended that he started on anticoagulation. Discussed this with patient and he is hesitant to initiate anticoagulation at this time. Discussed following up with his cardiologist for further recommendations and follow-up. Recommend avoiding Sudafed in the future.   CHA2DS2/VAS Stroke Risk Points  Current as of just now     4 >= 2 Points: High Risk  1 - 1.99 Points: Medium Risk  0 Points: Low Risk    Last Change: N/A      Details    This score determines the patient's risk of having a stroke if the  patient has atrial fibrillation.       Points Metrics  0 Has Congestive Heart Failure:  No    Current as of just now  1 Has Vascular Disease:  Yes    Current as of just now  1 Has Hypertension:  Yes    Current as of just now  1 Age:  34    Current as of just now  1 Has  Diabetes:  Yes    Current as of just now  0 Had Stroke:  No  Had TIA:  No  Had Thromboembolism:  No    Current as of just now  0 Male:  No    Current as of just now          Final Clinical Impression(s) / ED Diagnoses Final diagnoses:  Paroxysmal atrial fibrillation (HCC)  Nonspecific chest pain    Rx / DC Orders ED Discharge Orders          Ordered    apixaban (ELIQUIS) 5 MG TABS tablet  2 times daily        09/09/21 0716             Quintella Reichert, MD 09/09/21 0745

## 2021-09-09 NOTE — ED Triage Notes (Signed)
Patient here from home, woke up with chest pain around 1am.  8/10, nonradiating.  Patient took 2 of his home nitros, no pain upon EMS arrival.  Patient's Apple Watch alarmed him that he was in AFib @ 140.  He is ST with EMS at 102.  Patient has hx of stents and Aflutter.  Took 324mg  ASA with EMS.  Patient is chest pain free at this time.

## 2021-09-09 NOTE — ED Notes (Signed)
Water given  

## 2021-09-18 ENCOUNTER — Other Ambulatory Visit: Payer: Self-pay

## 2021-09-19 ENCOUNTER — Encounter: Payer: Self-pay | Admitting: Cardiology

## 2021-09-19 ENCOUNTER — Other Ambulatory Visit: Payer: Self-pay

## 2021-09-19 ENCOUNTER — Ambulatory Visit (INDEPENDENT_AMBULATORY_CARE_PROVIDER_SITE_OTHER): Payer: Medicare Other | Admitting: Cardiology

## 2021-09-19 VITALS — BP 112/70 | HR 80 | Ht 70.0 in | Wt 172.0 lb

## 2021-09-19 DIAGNOSIS — E1069 Type 1 diabetes mellitus with other specified complication: Secondary | ICD-10-CM

## 2021-09-19 DIAGNOSIS — E088 Diabetes mellitus due to underlying condition with unspecified complications: Secondary | ICD-10-CM

## 2021-09-19 DIAGNOSIS — I2510 Atherosclerotic heart disease of native coronary artery without angina pectoris: Secondary | ICD-10-CM

## 2021-09-19 DIAGNOSIS — I480 Paroxysmal atrial fibrillation: Secondary | ICD-10-CM

## 2021-09-19 DIAGNOSIS — I10 Essential (primary) hypertension: Secondary | ICD-10-CM | POA: Diagnosis not present

## 2021-09-19 DIAGNOSIS — E785 Hyperlipidemia, unspecified: Secondary | ICD-10-CM

## 2021-09-19 HISTORY — DX: Paroxysmal atrial fibrillation: I48.0

## 2021-09-19 NOTE — Progress Notes (Signed)
Cardiology Office Note:    Date:  09/19/2021   ID:  Mark Shah, DOB December 29, 1955, MRN [id number]  PCP:  Lujean Amel, MD  Cardiologist:  Jenean Lindau, MD   Referring MD: Lujean Amel, MD    ASSESSMENT:    1. Atherosclerosis of native coronary artery of native heart without angina pectoris   2. Coronary artery disease involving native coronary artery of native heart without angina pectoris   3. Essential hypertension   4. Hyperlipidemia due to type 1 diabetes mellitus ([place])   5. Diabetes mellitus due to underlying condition with unspecified complications ([place])   6. PAF (paroxysmal atrial fibrillation) (HCC)    PLAN:    In order of problems listed above:  Paroxysmal atrial fibrillation:I discussed with the patient atrial fibrillation, disease process. Management and therapy including rate and rhythm control, anticoagulation benefits and potential risks were discussed extensively with the patient. Patient had multiple questions which were answered to patient's satisfaction.  Patient is going to let us know about the medication choice.  Currently he is not working on anticoagulation because of cost issues.  Risks explained and he understands.  He will get back to [place] about which anticoagulant will work best for him from a cost standpoint and I gave him the list of medications written down to him.  I also discussed antiarrhythmic therapy with medication such as dronedrone but again for cost issues he wants to hold off and I respect his wishes. Coronary artery disease: Stable at this time.  Active gentleman without any symptoms.  Medical management. Mixed dyslipidemia: Lipids are elevated.  This is followed by primary care.  I encouraged him to diet and get his lipids checked by them again. Diabetes mellitus: Current management.  Hemoglobin A1c is elevated and followed by primary care. Patient will be seen in follow-up appointment in 6 months or earlier if the patient has any  concerns    Medication Adjustments/Labs and Tests Ordered: Current medicines are reviewed at length with the patient today.  Concerns regarding medicines are outlined above.  No orders of the defined types were placed in this encounter.  No orders of the defined types were placed in this encounter.    No chief complaint on file.    History of Present Illness:    Mark Shah is a 65 y.o. male.  Patient has past medical history of coronary artery disease, essential hypertension, dyslipidemia and diabetes mellitus.  He has history of atrial flutter post ablation.  He recently took Sudafed for upper respiratory tract symptoms and tells me on the third day of taking this medication he developed atrial fibrillation.  I reviewed tracings that he sent for [place].  He then went to the emergency room and was evaluated and discharged.  He did not want anticoagulation at that point.  At the time of my evaluation, the patient is alert awake oriented and in no distress.  Past Medical History:  Diagnosis Date   Adhesive capsulitis    in shoulders   Atherosclerotic heart disease of native coronary artery without angina pectoris 02/06/2021   Atrial fibrillation with RVR (HCC) 01/03/2019   Atrial flutter, paroxysmal ([place]) 01/07/2019   CAD (coronary artery disease), native coronary artery 05/14/2018   Cath 05/13/18 LM normal, ostial LAD 25%, 95% prox LAD, 70% prox diag, 40% mid LAD, 30% prox circ, irregularities RCA  2.75 x 12 mm Synergy stent to prox LAD post dil to 2.9 and 2.25 x 12 mm Synergy stent post dial to  2.4 Dr. Ellyn Hack    Chronic maxillary sinusitis 02/06/2021   Deviated septum 10/06/2019   Diabetes mellitus due to underlying condition with unspecified complications ([place]) [id number]   Diabetes mellitus without complication ([place])    Diverticulitis    Dyslipidemia    ED (erectile dysfunction)    ED (erectile dysfunction) of organic origin 02/06/2021   Essential hypertension 05/13/2018   Gastroesophageal  reflux disease without esophagitis 10/06/2019   Globus pharyngeus 10/06/2019   Hearing loss of left ear 01/12/2020   HTN (hypertension)    Hypercholesteremia 01/03/2019   Hyperglycemia due to type 1 diabetes mellitus ([place]) 05/13/2018   Hyperlipidemia    Hyperlipidemia due to type 1 diabetes mellitus ([place]) 05/13/2018   Hyperlipidemia, unspecified    Hypothyroid 08/28/2013   Hypothyroidism    Insomnia 02/06/2021   Left lower quadrant pain 02/06/2021   Left-sided tinnitus 01/12/2020   Long term (current) use of insulin ([place]) 02/06/2021   Low back pain 02/06/2021   Mixed dyslipidemia 01/07/2019   Nasal turbinate hypertrophy 10/06/2019   Obstructive sleep apnea syndrome 02/06/2021   Overweight 02/06/2021   Peripheral neuropathy 02/06/2021   PONV (postoperative nausea and vomiting)    Pure hypercholesterolemia 02/06/2021   Reactive depression 02/06/2021   Rhinitis, chronic 10/06/2019   Type 1 diabetes mellitus ([place]) 08/28/2013   Typical atrial flutter ([place])    Unspecified abnormal finding in specimens from other organs, systems and tissues 02/06/2021    Past Surgical History:  Procedure Laterality Date   A-FLUTTER ABLATION N/A 08/28/2019   Procedure: A-FLUTTER ABLATION;  Surgeon: Constance Haw, MD;  Location: [facility];  Service: Cardiovascular;  Laterality: N/A;   BACK SURGERY     CERVICAL DISC SURGERY     CORONARY STENT INTERVENTION N/A 05/13/2018   Procedure: CORONARY STENT INTERVENTION;  Surgeon: Leonie Man, MD;  Location: [facility];  Service: Cardiovascular;  Laterality: N/A;   left hand     LEFT HEART CATH AND CORONARY ANGIOGRAPHY N/A 05/13/2018   Procedure: LEFT HEART CATH AND CORONARY ANGIOGRAPHY;  Surgeon: Leonie Man, MD;  Location: [facility];  Service: Cardiovascular;  Laterality: N/A;    Current Medications: No outpatient medications have been marked as taking for the 09/19/21 encounter (Office Visit) with Sia Gabrielsen, Reita Cliche, MD.     Allergies:    Atorvastatin, Erythromycin, and Silicone   Social History   Socioeconomic History   Marital status: Married    Spouse name: Not on file   Number of children: Not on file   Years of education: Not on file   Highest education level: Not on file  Occupational History   Not on file  Tobacco Use   Smoking status: Former   Smokeless tobacco: Never  Substance and Sexual Activity   Alcohol use: Yes   Drug use: No   Sexual activity: Yes  Other Topics Concern   Not on file  Social History Narrative   Not on file   Social Determinants of Health   Financial Resource Strain: Not on file  Food Insecurity: Not on file  Transportation Needs: Not on file  Physical Activity: Not on file  Stress: Not on file  Social Connections: Not on file     Family History: The patient's family history includes Asthma in his father and mother; COPD in his father; CVA in his paternal grandfather; Heart Problems in his father and paternal grandmother; Hyperlipidemia in his mother and paternal grandfather; Lung cancer in his maternal  grandfather; Other in his brother; Stroke in his paternal grandfather.  ROS:   Please see the history of present illness.    All other systems reviewed and are negative.  EKGs/Labs/Other Studies Reviewed:    The following studies were reviewed today: EKG reveals sinus rhythm and nonspecific ST-T changes   Recent Labs: 04/27/2021: ALT 18 09/09/2021: BUN 9; Creatinine, Ser 1.19; Hemoglobin 14.4; Platelets 225; Potassium 3.8; Sodium 136  Recent Lipid Panel    Component Value Date/Time   CHOL 198 12/29/2020 1013   TRIG 101 12/29/2020 1013   HDL 56 12/29/2020 1013   CHOLHDL 3.5 12/29/2020 1013   LDLCALC 124 (H) 12/29/2020 1013    Physical Exam:    VS:  BP 112/70   Pulse 80   Ht 5\' 10"  (1.778 m)   Wt 172 lb (78 kg)   SpO2 99%   BMI 24.68 kg/m     Wt Readings from Last 3 Encounters:  09/19/21 172 lb (78 kg)  09/04/21 179 lb (81.2 kg)  04/27/21 173 lb (78.5  kg)     GEN: Patient is in no acute distress HEENT: Normal NECK: No JVD; No carotid bruits LYMPHATICS: No lymphadenopathy CARDIAC: Hear sounds regular, 2/6 systolic murmur at the apex. RESPIRATORY:  Clear to auscultation without rales, wheezing or rhonchi  ABDOMEN: Soft, non-tender, non-distended MUSCULOSKELETAL:  No edema; No deformity  SKIN: Warm and dry NEUROLOGIC:  Alert and oriented x 3 PSYCHIATRIC:  Normal affect   Signed, 04/29/21, MD  09/19/2021 10:45 AM    [facility]

## 2021-09-19 NOTE — Patient Instructions (Addendum)
Medication Instructions:  Your physician recommends that you continue on your current medications as directed. Please refer to the Current Medication list given to you today.  *If you need a refill on your cardiac medications before your next appointment, please call your pharmacy*   Lab Work: None ordered If you have labs (blood work) drawn today and your tests are completely normal, you will receive your results only by: MyChart Message (if you have MyChart) OR A paper copy in the mail If you have any lab test that is abnormal or we need to change your treatment, we will call you to review the results.   Testing/Procedures: None ordered   Follow-Up: At [facility], you and your health needs are our priority.  As part of our continuing mission to provide you with exceptional heart care, we have created designated Provider Care Teams.  These Care Teams include your primary Cardiologist (physician) and Advanced Practice Providers (APPs -  Physician Assistants and Nurse Practitioners) who all work together to provide you with the care you need, when you need it.  We recommend signing up for the patient portal called "MyChart".  Sign up information is provided on this After Visit Summary.  MyChart is used to connect with patients for Virtual Visits (Telemedicine).  Patients are able to view lab/test results, encounter notes, upcoming appointments, etc.  Non-urgent messages can be sent to your provider as well.   To learn more about what you can do with MyChart, go to [email or web address].    Your next appointment:   3 month(s)  The format for your next appointment:   In Person  Provider:   Rajan Revankar, MD   Other Instructions NA  

## 2021-09-26 DIAGNOSIS — L821 Other seborrheic keratosis: Secondary | ICD-10-CM

## 2021-09-26 DIAGNOSIS — L710 Perioral dermatitis: Secondary | ICD-10-CM | POA: Insufficient documentation

## 2021-09-26 HISTORY — DX: Perioral dermatitis: L71.0

## 2021-09-26 HISTORY — DX: Other seborrheic keratosis: L82.1

## 2021-11-22 ENCOUNTER — Other Ambulatory Visit: Payer: Self-pay | Admitting: Cardiology

## 2021-11-23 ENCOUNTER — Telehealth: Payer: Self-pay | Admitting: Cardiology

## 2021-11-23 DIAGNOSIS — I480 Paroxysmal atrial fibrillation: Secondary | ICD-10-CM

## 2021-11-23 NOTE — Telephone Encounter (Signed)
Dr Tomie China, is this patient supposed to be on Eliquis?  Prescription refill request for Eliquis received. Indication: a fib Last office visit: 09/19/21 Scr: 1.19 Age: 66 Weight: 78

## 2021-11-23 NOTE — Telephone Encounter (Signed)
*  STAT* If patient is at the pharmacy, call can be transferred to refill team.   1. Which medications need to be refilled? (please list name of each medication and dose if known) new prescription for Eliquis   2. Which pharmacy/location (including street and city if local pharmacy) is medication to be sent to?[facility]  3. Do they need a 30 day or 90 day supply? 90 days and refills  . 

## 2021-11-24 MED ORDER — APIXABAN 5 MG PO TABS: 5.0000 mg | ORAL_TABLET | Freq: Two times a day (BID) | ORAL | 1 refills | Status: DC

## 2021-11-24 NOTE — Telephone Encounter (Signed)
Rx sent to pharmacy   

## 2021-11-26 IMAGING — CR DG CHEST 2V
3 series · 3 of 3 positions shown · non-contrast
Comparison: 04/29/2018 chest radiograph.

CLINICAL DATA: Chest pain

EXAM:
CHEST - 2 VIEW

[chest lat (1 of 2)]
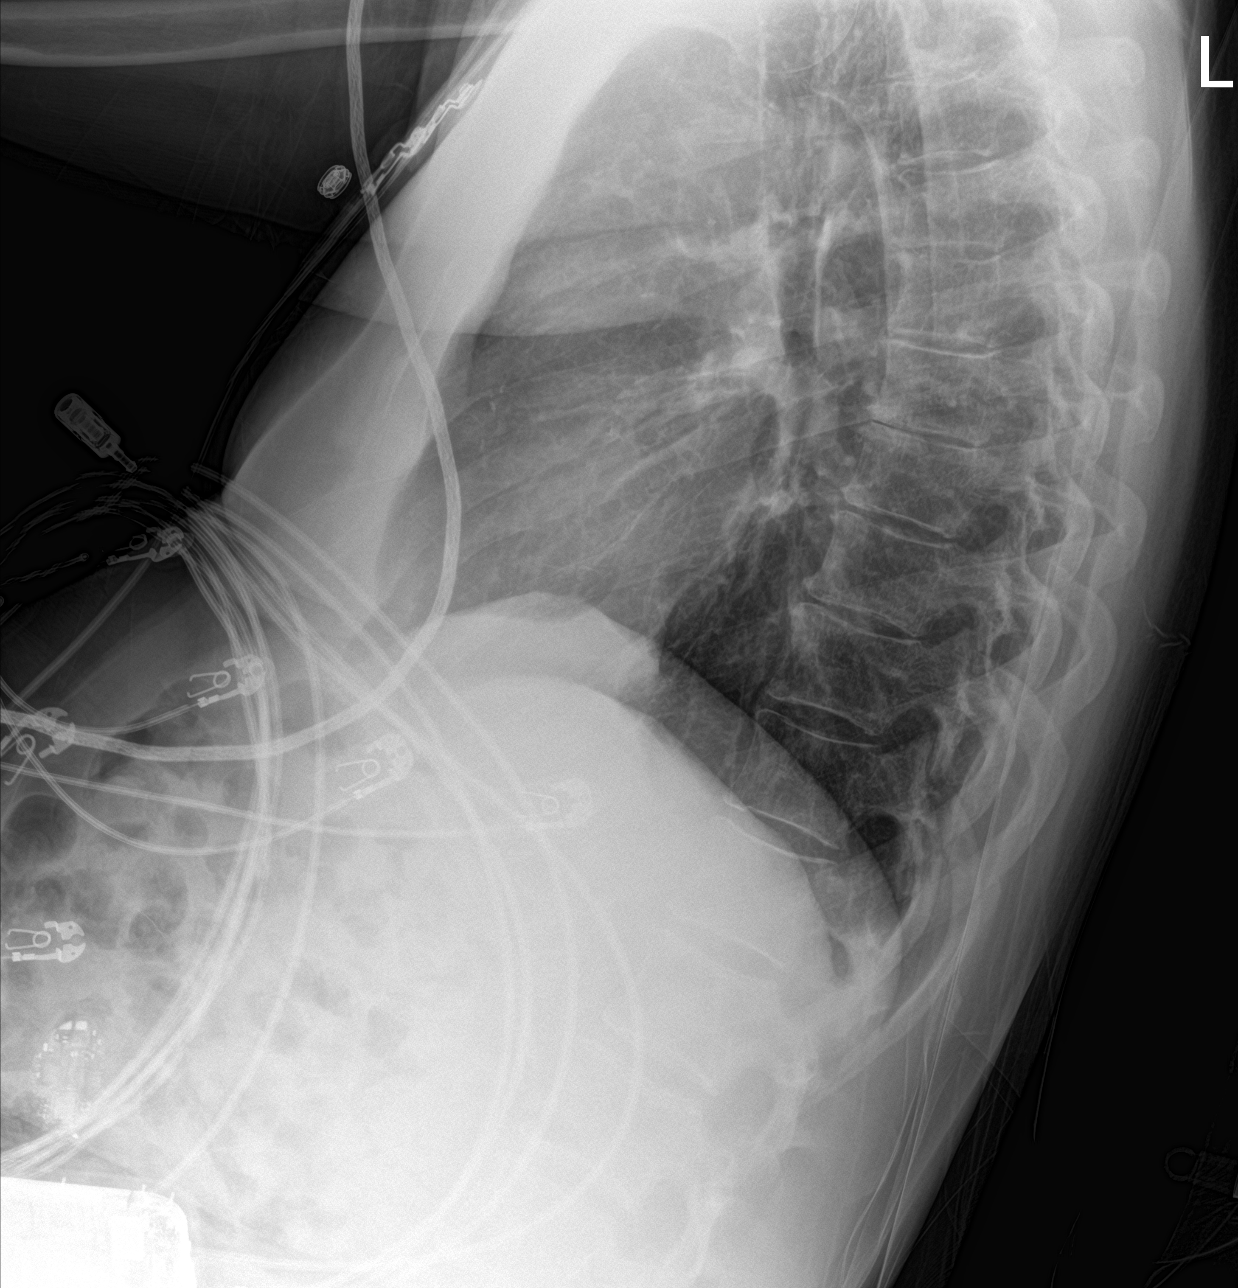

[chest ap]
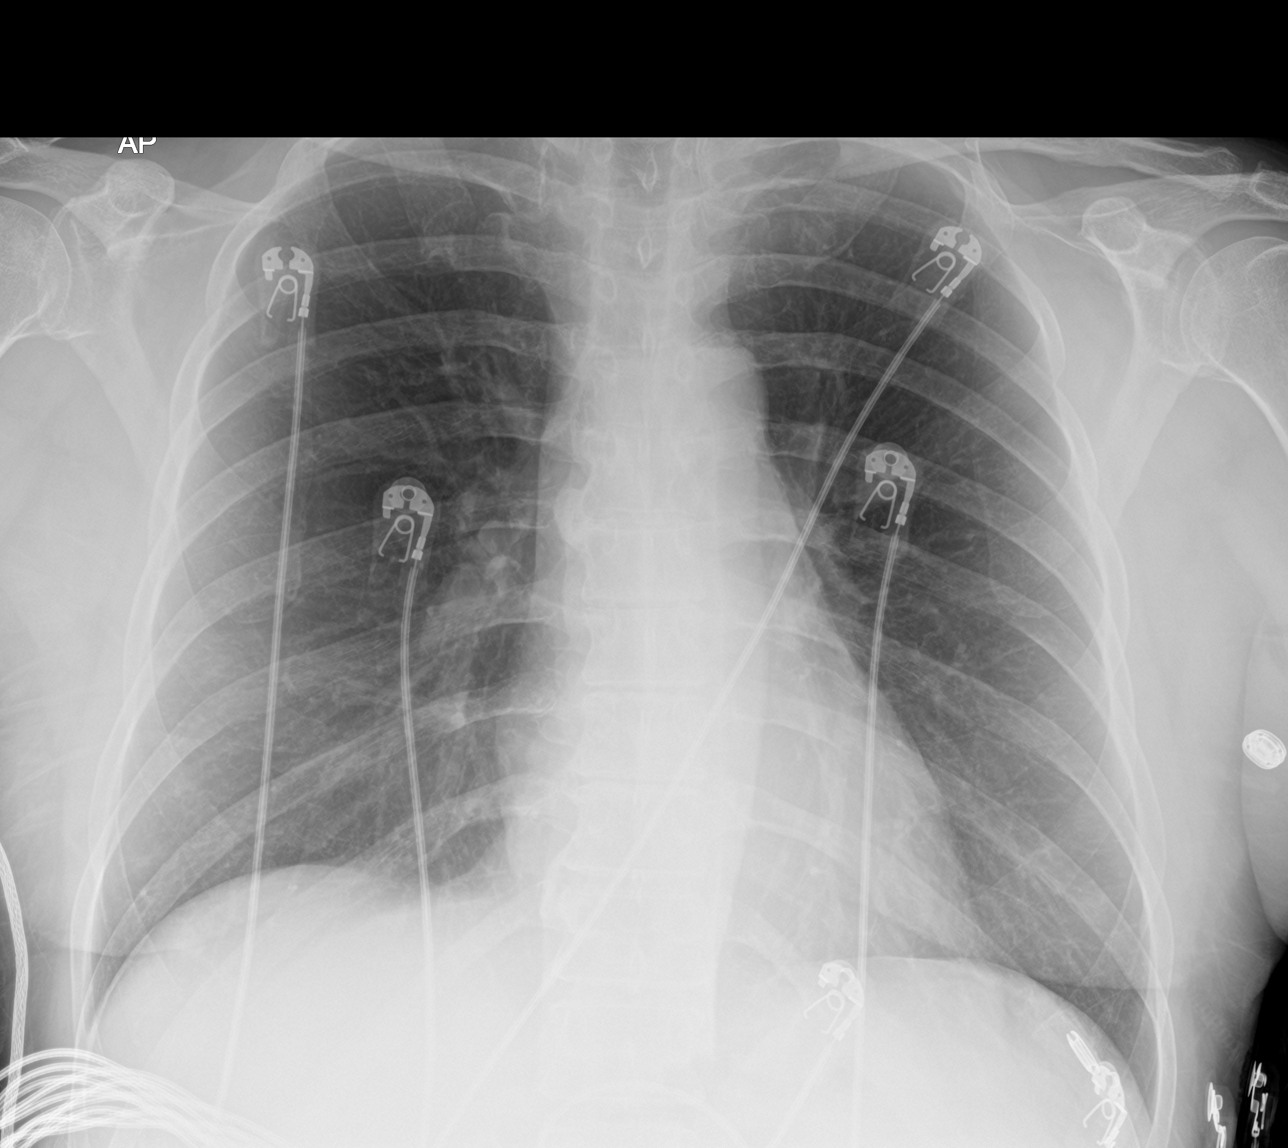

[chest lat (2 of 2)]
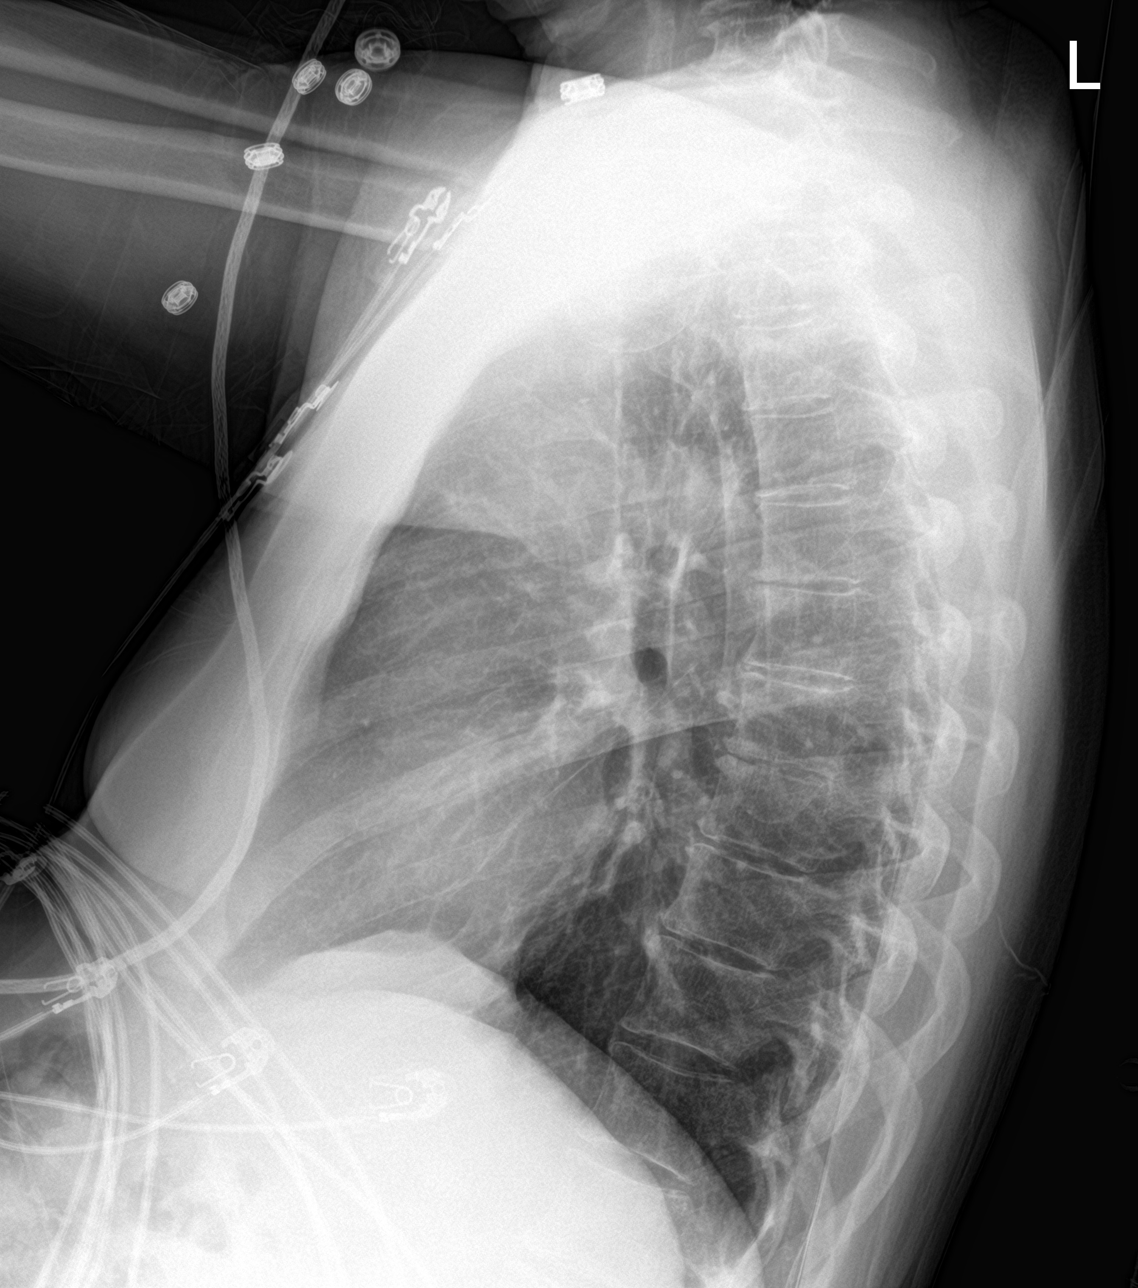

[3 of 3 positions shown; findings below may reference images not displayed]

FINDINGS: Stable cardiomediastinal silhouette with normal heart size. No
pneumothorax. No pleural effusion. Lungs appear clear, with no acute
consolidative airspace disease and no pulmonary edema.
IMPRESSION: No active cardiopulmonary disease.

## 2021-12-08 ENCOUNTER — Other Ambulatory Visit: Payer: Self-pay

## 2021-12-14 ENCOUNTER — Ambulatory Visit (INDEPENDENT_AMBULATORY_CARE_PROVIDER_SITE_OTHER): Payer: Medicare Other | Admitting: Cardiology

## 2021-12-14 ENCOUNTER — Other Ambulatory Visit: Payer: Self-pay

## 2021-12-14 ENCOUNTER — Encounter: Payer: Self-pay | Admitting: Cardiology

## 2021-12-14 VITALS — BP 114/66 | HR 78 | Ht 70.0 in | Wt 165.0 lb

## 2021-12-14 DIAGNOSIS — I480 Paroxysmal atrial fibrillation: Secondary | ICD-10-CM

## 2021-12-14 DIAGNOSIS — I10 Essential (primary) hypertension: Secondary | ICD-10-CM | POA: Diagnosis not present

## 2021-12-14 DIAGNOSIS — I2510 Atherosclerotic heart disease of native coronary artery without angina pectoris: Secondary | ICD-10-CM | POA: Diagnosis not present

## 2021-12-14 DIAGNOSIS — E088 Diabetes mellitus due to underlying condition with unspecified complications: Secondary | ICD-10-CM

## 2021-12-14 DIAGNOSIS — E785 Hyperlipidemia, unspecified: Secondary | ICD-10-CM

## 2021-12-14 DIAGNOSIS — Z1321 Encounter for screening for nutritional disorder: Secondary | ICD-10-CM

## 2021-12-14 DIAGNOSIS — E039 Hypothyroidism, unspecified: Secondary | ICD-10-CM

## 2021-12-14 NOTE — Progress Notes (Signed)
Cardiology Office Note:    Date:  12/14/2021   ID:  Mark Shah, DOB [birth year], MRN [id number]  PCP:  Lujean Amel, MD  Cardiologist:  Jenean Lindau, MD   Referring MD: Lujean Amel, MD    ASSESSMENT:    1. PAF (paroxysmal atrial fibrillation) ([place])   2. Essential hypertension   3. Coronary artery disease involving native coronary artery of native heart without angina pectoris   4. Diabetes mellitus due to underlying condition with unspecified complications ([place])    PLAN:    In order of problems listed above:  Coronary artery disease: Secondary prevention stressed with the patient.  Importance of compliance with diet and medication and he vocalized understanding.  He was advised to continue his excellent exercise protocol. Essential hypertension: Blood pressure stable.  Diet was emphasized. Mixed dyslipidemia: He has not had lipids checked for some time we will do blood work today and advise him accordingly. Diabetes mellitus: Managed by primary care. Paroxysmal atrial fibrillation:I discussed with the patient atrial fibrillation, disease process. Management and therapy including rate and rhythm control, anticoagulation benefits and potential risks were discussed extensively with the patient. Patient had multiple questions which were answered to patient's satisfaction. He also requests vitamin D and hemoglobin A1c to be checked and we will do this Patient will be seen in follow-up appointment in 6 months or earlier if the patient has any concerns    Medication Adjustments/Labs and Tests Ordered: Current medicines are reviewed at length with the patient today.  Concerns regarding medicines are outlined above.  No orders of the defined types were placed in this encounter.  No orders of the defined types were placed in this encounter.    No chief complaint on file.    History of Present Illness:    Mark Shah is a 66 y.o. male.  Patient has a past medical  history of coronary artery disease, essential hypertension, mixed dyslipidemia and diabetes mellitus.  He denies any problems at this time and takes care of activities of daily living.  He does a slow jog 3 miles a day without any issues.  He has paroxysmal atrial fibrillation.  At the time of my evaluation, the patient is alert awake oriented and in no distress.  Past Medical History:  Diagnosis Date   Acquired hypothyroidism 08/28/2013   Adhesive capsulitis    in shoulders   Atherosclerosis of coronary artery 05/14/2018   Formatting of this note might be different from the original. Formatting of this note might be different from the original. Cath 05/13/18 LM normal, ostial LAD 25%, 95% prox LAD, 70% prox diag, 40% mid LAD, 30% prox circ, irregularities RCA  2.75 x 12 mm Synergy stent to prox LAD post dil to 2.9 and 2.25 x 12 mm Synergy stent post dial to 2.4 Dr. Ellyn Hack   Atherosclerotic heart disease of native coronary artery without angina pectoris 02/06/2021   Atrial fibrillation with RVR ([place]) 01/03/2019   Atrial flutter, paroxysmal ([place]) 01/07/2019   CAD (coronary artery disease), native coronary artery 05/14/2018   Cath 05/13/18 LM normal, ostial LAD 25%, 95% prox LAD, 70% prox diag, 40% mid LAD, 30% prox circ, irregularities RCA  2.75 x 12 mm Synergy stent to prox LAD post dil to 2.9 and 2.25 x 12 mm Synergy stent post dial to 2.4 Dr. Ellyn Hack    Chronic maxillary sinusitis 02/06/2021   Deviated septum 10/06/2019   Diabetes mellitus due to underlying condition with unspecified complications ([place]) [id number]   Diabetes  mellitus without complication ([place])    Diverticulitis    Dyslipidemia    ED (erectile dysfunction)    ED (erectile dysfunction) of organic origin 02/06/2021   Essential hypertension 05/13/2018   Gastroesophageal reflux disease without esophagitis 10/06/2019   Globus pharyngeus 10/06/2019   Hearing loss of left ear 01/12/2020   HTN (hypertension)    Hypercholesteremia 01/03/2019    Hyperglycemia due to type 1 diabetes mellitus ([place]) 05/13/2018   Hyperlipidemia    Hyperlipidemia due to type 1 diabetes mellitus ([place]) 05/13/2018   Hyperlipidemia, unspecified    Hypothyroid 08/28/2013   Hypothyroidism    Insomnia 02/06/2021   Left lower quadrant pain 02/06/2021   Left-sided tinnitus 01/12/2020   Long term (current) use of insulin ([place]) 02/06/2021   Low back pain 02/06/2021   Mixed dyslipidemia 01/07/2019   Nasal turbinate hypertrophy 10/06/2019   Obstructive sleep apnea syndrome 02/06/2021   Other seborrheic keratosis 09/26/2021   Overweight 02/06/2021   PAF (paroxysmal atrial fibrillation) ([place]) 09/19/2021   Perioral dermatitis 09/26/2021   Peripheral neuropathy 02/06/2021   PONV (postoperative nausea and vomiting)    Pure hypercholesterolemia 02/06/2021   Reactive depression 02/06/2021   Rhinitis, chronic 10/06/2019   Type 1 diabetes mellitus ([place]) 08/28/2013   Typical atrial flutter ([place])    Unspecified abnormal finding in specimens from other organs, systems and tissues 02/06/2021    Past Surgical History:  Procedure Laterality Date   A-FLUTTER ABLATION N/A 08/28/2019   Procedure: A-FLUTTER ABLATION;  Surgeon: Constance Haw, MD;  Location: [facility];  Service: Cardiovascular;  Laterality: N/A;   BACK SURGERY     CERVICAL DISC SURGERY     CORONARY STENT INTERVENTION N/A 05/13/2018   Procedure: CORONARY STENT INTERVENTION;  Surgeon: Leonie Man, MD;  Location: [facility];  Service: Cardiovascular;  Laterality: N/A;   left hand     LEFT HEART CATH AND CORONARY ANGIOGRAPHY N/A 05/13/2018   Procedure: LEFT HEART CATH AND CORONARY ANGIOGRAPHY;  Surgeon: Leonie Man, MD;  Location: [facility];  Service: Cardiovascular;  Laterality: N/A;    Current Medications: Current Meds  Medication Sig   acetaminophen (TYLENOL) 500 MG tablet Take 1,000 mg by mouth every 6 (six) hours as needed for moderate pain or headache.   apixaban (ELIQUIS) 5 MG TABS tablet  Take 1 tablet (5 mg total) by mouth 2 (two) times daily.   Cholecalciferol (VITAMIN D3 PO) Take 1 tablet by mouth at bedtime.   clopidogrel (PLAVIX) 75 MG tablet Take 75 mg by mouth daily.   Cyanocobalamin (VITAMIN B-12 PO) Take 1 tablet by mouth at bedtime.   fluticasone (FLONASE) 50 MCG/ACT nasal spray Place 1 spray into both nostrils daily as needed for allergies.    Glucagon HCl 1 MG SOLR Inject 1 mg into the skin once as needed (low blood sugar).   insulin glargine (LANTUS) 100 UNIT/ML Solostar Pen Inject 11 Units into the skin as needed (for blood sugar if insulin pump cannot be used).   insulin lispro (HUMALOG) 100 UNIT/ML injection Inject 20 Units into the skin 3 (three) times daily as needed for high blood sugar (if insulin pump malfunction).   levothyroxine (SYNTHROID) 137 MCG tablet Take 137 mcg by mouth every morning.   lisinopril (ZESTRIL) 2.5 MG tablet Take 2.5 mg by mouth daily.   loratadine (CLARITIN) 10 MG tablet Take 10 mg by mouth daily as needed for allergies.   LYUMJEV 100 UNIT/ML SOLN Inject 60 Units into the  skin daily. Via insulin pump   MAGNESIUM PO Take 1 tablet by mouth at bedtime.   nitroGLYCERIN (NITROSTAT) 0.4 MG SL tablet Place 1 tablet (0.4 mg total) under the tongue every 5 (five) minutes as needed for chest pain.   omega-3 acid ethyl esters (LOVAZA) 1 g capsule Take 2 g by mouth at bedtime.   ranolazine (RANEXA) 1000 MG SR tablet Take 1 tablet (1,000 mg total) by mouth 2 (two) times daily.   rosuvastatin (CRESTOR) 5 MG tablet Take 1 tablet (5 mg total) by mouth daily. On Monday, Wednesday, and Friday (Patient taking differently: Take 5 mg by mouth daily.)   sildenafil (VIAGRA) 100 MG tablet Take 50 mg by mouth daily as needed for erectile dysfunction.     Allergies:   Atorvastatin, Erythromycin, and Silicone   Social History   Socioeconomic History   Marital status: Married    Spouse name: Not on file   Number of children: Not on file   Years of  education: Not on file   Highest education level: Not on file  Occupational History   Not on file  Tobacco Use   Smoking status: Former   Smokeless tobacco: Never  Substance and Sexual Activity   Alcohol use: Yes   Drug use: No   Sexual activity: Yes  Other Topics Concern   Not on file  Social History Narrative   Not on file   Social Determinants of Health   Financial Resource Strain: Not on file  Food Insecurity: Not on file  Transportation Needs: Not on file  Physical Activity: Not on file  Stress: Not on file  Social Connections: Not on file     Family History: The patient's family history includes Asthma in his father and mother; COPD in his father; CVA in his paternal grandfather; Heart Problems in his father and paternal grandmother; Hyperlipidemia in his mother and paternal grandfather; Lung cancer in his maternal grandfather; Other in his brother; Stroke in his paternal grandfather.  ROS:   Please see the history of present illness.    All other systems reviewed and are negative.  EKGs/Labs/Other Studies Reviewed:    The following studies were reviewed today: I discussed my findings with the patient at length.   Recent Labs: 04/27/2021: ALT 18 09/09/2021: BUN 9; Creatinine, Ser 1.19; Hemoglobin 14.4; Platelets 225; Potassium 3.8; Sodium 136  Recent Lipid Panel    Component Value Date/Time   CHOL 198 12/29/2020 1013   TRIG 101 12/29/2020 1013   HDL 56 12/29/2020 1013   CHOLHDL 3.5 12/29/2020 1013   LDLCALC 124 (H) 12/29/2020 1013    Physical Exam:    VS:  BP 114/66    Pulse 78    Ht 5\' 10"  (1.778 m)    Wt 165 lb 0.6 oz (74.9 kg)    SpO2 99%    BMI 23.68 kg/m     Wt Readings from Last 3 Encounters:  12/14/21 165 lb 0.6 oz (74.9 kg)  09/19/21 172 lb (78 kg)  09/04/21 179 lb (81.2 kg)     GEN: Patient is in no acute distress HEENT: Normal NECK: No JVD; No carotid bruits LYMPHATICS: No lymphadenopathy CARDIAC: Hear sounds regular, 2/6 systolic  murmur at the apex. RESPIRATORY:  Clear to auscultation without rales, wheezing or rhonchi  ABDOMEN: Soft, non-tender, non-distended MUSCULOSKELETAL:  No edema; No deformity  SKIN: Warm and dry NEUROLOGIC:  Alert and oriented x 3 PSYCHIATRIC:  Normal affect   Signed, Garwin Brothers, MD  12/14/2021 11:00 AM    [facility]

## 2021-12-14 NOTE — Patient Instructions (Signed)
Medication Instructions:  ?Your physician recommends that you continue on your current medications as directed. Please refer to the Current Medication list given to you today. ? ?*If you need a refill on your cardiac medications before your next appointment, please call your pharmacy* ? ? ?Lab Work: ?Your physician recommends that you have labs done in the office today. Your test included  basic metabolic panel, complete blood count, TSH, A1C, liver function and lipids. ? ?If you have labs (blood work) drawn today and your tests are completely normal, you will receive your results only by: ?MyChart Message (if you have MyChart) OR ?A paper copy in the mail ?If you have any lab test that is abnormal or we need to change your treatment, we will call you to review the results. ? ? ?Testing/Procedures: ?None ordered ? ? ?Follow-Up: ?At [facility], you and your health needs are our priority.  As part of our continuing mission to provide you with exceptional heart care, we have created designated Provider Care Teams.  These Care Teams include your primary Cardiologist (physician) and Advanced Practice Providers (APPs -  Physician Assistants and Nurse Practitioners) who all work together to provide you with the care you need, when you need it. ? ?We recommend signing up for the patient portal called "MyChart".  Sign up information is provided on this After Visit Summary.  MyChart is used to connect with patients for Virtual Visits (Telemedicine).  Patients are able to view lab/test results, encounter notes, upcoming appointments, etc.  Non-urgent messages can be sent to your provider as well.   ?To learn more about what you can do with MyChart, go to [email or web address].   ? ?Your next appointment:   ?9 month(s) ? ?The format for your next appointment:   ?In Person ? ?Provider:   ?Rajan Revankar, MD ? ? ?Other Instructions ?NA   ?

## 2021-12-15 LAB — HEMOGLOBIN A1C
Est. average glucose Bld gHb Est-mCnc: 151 mg/dL
Hgb A1c MFr Bld: 6.9 % — ABNORMAL HIGH (ref 4.8–5.6)

## 2021-12-15 LAB — CBC WITH DIFFERENTIAL/PLATELET
Basophils Absolute: 0.1 10*3/uL (ref 0.0–0.2)
Basos: 1 %
EOS (ABSOLUTE): 0 10*3/uL (ref 0.0–0.4)
Eos: 1 %
Hematocrit: 38 % (ref 37.5–51.0)
Hemoglobin: 12.8 g/dL — ABNORMAL LOW (ref 13.0–17.7)
Immature Grans (Abs): 0 10*3/uL (ref 0.0–0.1)
Immature Granulocytes: 0 %
Lymphocytes Absolute: 1.1 10*3/uL (ref 0.7–3.1)
Lymphs: 30 %
MCH: 33.7 pg — ABNORMAL HIGH (ref 26.6–33.0)
MCHC: 33.7 g/dL (ref 31.5–35.7)
MCV: 100 fL — ABNORMAL HIGH (ref 79–97)
Monocytes Absolute: 0.4 10*3/uL (ref 0.1–0.9)
Monocytes: 11 %
Neutrophils Absolute: 2.1 10*3/uL (ref 1.4–7.0)
Neutrophils: 57 %
Platelets: 240 10*3/uL (ref 150–450)
RBC: 3.8 x10E6/uL — ABNORMAL LOW (ref 4.14–5.80)
RDW: 12.3 % (ref 11.6–15.4)
WBC: 3.7 10*3/uL (ref 3.4–10.8)

## 2021-12-15 LAB — HEPATIC FUNCTION PANEL
ALT: 18 IU/L (ref 0–44)
AST: 20 IU/L (ref 0–40)
Albumin: 4.4 g/dL (ref 3.8–4.8)
Alkaline Phosphatase: 49 IU/L (ref 44–121)
Bilirubin Total: 0.5 mg/dL (ref 0.0–1.2)
Bilirubin, Direct: 0.15 mg/dL (ref 0.00–0.40)
Total Protein: 6.4 g/dL (ref 6.0–8.5)

## 2021-12-15 LAB — LIPID PANEL
Chol/HDL Ratio: 3.1 ratio (ref 0.0–5.0)
Cholesterol, Total: 212 mg/dL — ABNORMAL HIGH (ref 100–199)
HDL: 69 mg/dL (ref >39)
LDL Chol Calc (NIH): 133 mg/dL — ABNORMAL HIGH (ref 0–99)
Triglycerides: 55 mg/dL (ref 0–149)
VLDL Cholesterol Cal: 10 mg/dL (ref 5–40)

## 2021-12-15 LAB — BASIC METABOLIC PANEL
BUN/Creatinine Ratio: 5 — ABNORMAL LOW (ref 10–24)
BUN: 5 mg/dL — ABNORMAL LOW (ref 8–27)
CO2: 25 mmol/L (ref 20–29)
Calcium: 9.2 mg/dL (ref 8.6–10.2)
Chloride: 100 mmol/L (ref 96–106)
Creatinine, Ser: 1.05 mg/dL (ref 0.76–1.27)
Glucose: 65 mg/dL — ABNORMAL LOW (ref 70–99)
Potassium: 5 mmol/L (ref 3.5–5.2)
Sodium: 136 mmol/L (ref 134–144)
eGFR: 78 mL/min/{1.73_m2} (ref >59)

## 2021-12-15 LAB — TSH: TSH: 1.1 u[IU]/mL (ref 0.450–4.500)

## 2021-12-15 MED ORDER — ROSUVASTATIN CALCIUM 10 MG PO TABS: 5.0000 mg | ORAL_TABLET | Freq: Every day | ORAL | 3 refills | Status: DC

## 2021-12-15 NOTE — Addendum Note (Signed)
Addended by: Eleonore Chiquito on: 12/15/2021 01:13 PM   Modules accepted: Orders

## 2022-01-02 ENCOUNTER — Ambulatory Visit: Payer: Medicare Other | Admitting: Cardiology

## 2022-03-20 ENCOUNTER — Ambulatory Visit: Payer: Medicare Other | Admitting: Cardiology

## 2022-05-10 ENCOUNTER — Ambulatory Visit: Payer: Medicare Other | Admitting: Cardiology

## 2022-05-16 ENCOUNTER — Ambulatory Visit
Admission: RE | Admit: 2022-05-16 | Discharge: 2022-05-16 | Disposition: A | Discharge status: Home / Self Care | Payer: Medicare Other | Source: Ambulatory Visit | Attending: Family Medicine | Admitting: Family Medicine

## 2022-05-16 ENCOUNTER — Other Ambulatory Visit: Payer: Self-pay | Admitting: Family Medicine

## 2022-05-16 DIAGNOSIS — R053 Chronic cough: Secondary | ICD-10-CM

## 2022-06-04 ENCOUNTER — Ambulatory Visit: Payer: Medicare Other | Admitting: Cardiology

## 2022-06-19 ENCOUNTER — Other Ambulatory Visit: Payer: Self-pay | Admitting: Cardiology

## 2022-06-19 DIAGNOSIS — I480 Paroxysmal atrial fibrillation: Secondary | ICD-10-CM

## 2022-06-20 NOTE — Telephone Encounter (Signed)
Prescription refill request for Eliquis received. Indication:Afib Last office visit:2/23 Scr:1.0 Age: 66 Weight:74.9 kg  Prescription refilled

## 2022-07-03 ENCOUNTER — Other Ambulatory Visit: Payer: Self-pay

## 2022-07-03 DIAGNOSIS — L219 Seborrheic dermatitis, unspecified: Secondary | ICD-10-CM | POA: Insufficient documentation

## 2022-07-03 DIAGNOSIS — E039 Hypothyroidism, unspecified: Secondary | ICD-10-CM | POA: Insufficient documentation

## 2022-07-03 DIAGNOSIS — Z23 Encounter for immunization: Secondary | ICD-10-CM | POA: Insufficient documentation

## 2022-07-03 HISTORY — DX: Seborrheic dermatitis, unspecified: L21.9

## 2022-07-03 HISTORY — DX: Encounter for immunization: Z23

## 2022-07-04 ENCOUNTER — Ambulatory Visit: Payer: Medicare Other | Attending: Cardiology | Admitting: Cardiology

## 2022-07-04 ENCOUNTER — Encounter: Payer: Self-pay | Admitting: Cardiology

## 2022-07-04 VITALS — BP 102/58 | HR 76 | Ht 70.6 in | Wt 171.1 lb

## 2022-07-04 DIAGNOSIS — E1069 Type 1 diabetes mellitus with other specified complication: Secondary | ICD-10-CM | POA: Insufficient documentation

## 2022-07-04 DIAGNOSIS — E782 Mixed hyperlipidemia: Secondary | ICD-10-CM | POA: Insufficient documentation

## 2022-07-04 DIAGNOSIS — I2510 Atherosclerotic heart disease of native coronary artery without angina pectoris: Secondary | ICD-10-CM | POA: Insufficient documentation

## 2022-07-04 DIAGNOSIS — E785 Hyperlipidemia, unspecified: Secondary | ICD-10-CM | POA: Insufficient documentation

## 2022-07-04 DIAGNOSIS — I10 Essential (primary) hypertension: Secondary | ICD-10-CM | POA: Insufficient documentation

## 2022-07-04 DIAGNOSIS — I480 Paroxysmal atrial fibrillation: Secondary | ICD-10-CM | POA: Insufficient documentation

## 2022-07-04 NOTE — Patient Instructions (Signed)

## 2022-07-04 NOTE — Progress Notes (Signed)
Cardiology Office Note:    Date:  07/04/2022   ID:  Norris Brumbach, DOB 25-Jul-1956, MRN [id number]  PCP:  Darrow Bussing, MD  Cardiologist:  Garwin Brothers, MD   Referring MD: Catha Gosselin, MD    ASSESSMENT:    1. Coronary artery disease involving native coronary artery of native heart without angina pectoris   2. Essential hypertension   3. PAF (paroxysmal atrial fibrillation) (HCC)   4. Hyperlipidemia due to type 1 diabetes mellitus (HCC)   5. Mixed dyslipidemia    PLAN:    In order of problems listed above:  Neri artery disease: Secondary prevention stressed with the patient.  Importance of compliance with diet medication stressed any vocalized understanding.  He was advised to walk at least half an hour a day 5 days a week and he promises to do so.  He is very regular about this. Essential hypertension: Blood pressure stable and diet was emphasized. Mixed dyslipidemia: On lipid-lowering medications.  Lipids elevated and his primary doubled his statin.  Diet and exercise stressed.  He will get rechecked in 6 weeks.  I told him it must be less than 60 and he understands. Paroxysmal atrial fibrillation:I discussed with the patient atrial fibrillation, disease process. Management and therapy including rate and rhythm control, anticoagulation benefits and potential risks were discussed extensively with the patient. Patient had multiple questions which were answered to patient's satisfaction. Patient will be seen in follow-up appointment in 9 months or earlier if the patient has any concerns    Medication Adjustments/Labs and Tests Ordered: Current medicines are reviewed at length with the patient today.  Concerns regarding medicines are outlined above.  No orders of the defined types were placed in this encounter.  No orders of the defined types were placed in this encounter.    No chief complaint on file.    History of Present Illness:    Momodou Consiglio is a 66  y.o. male.  Patient has past medical history of coronary artery disease, essential hypertension, dyslipidemia, diabetes mellitus and paroxysmal atrial fibrillation.  She denies any problems at this time and takes care of activities of daily living.  No chest pain orthopnea or PND.  He walks on a regular basis.  Past Medical History:  Diagnosis Date   Acquired hypothyroidism 08/28/2013   Adhesive capsulitis    in shoulders   Atherosclerosis of coronary artery 05/14/2018   Formatting of this note might be different from the original. Formatting of this note might be different from the original. Cath 05/13/18 LM normal, ostial LAD 25%, 95% prox LAD, 70% prox diag, 40% mid LAD, 30% prox circ, irregularities RCA  2.75 x 12 mm Synergy stent to prox LAD post dil to 2.9 and 2.25 x 12 mm Synergy stent post dial to 2.4 Dr. Herbie [place]   Atherosclerotic heart disease of native coronary artery without angina pectoris 02/06/2021   Atrial fibrillation with RVR (HCC) 01/03/2019   Atrial flutter, paroxysmal (HCC) 01/07/2019   CAD (coronary artery disease), native coronary artery 05/14/2018   Cath 05/13/18 LM normal, ostial LAD 25%, 95% prox LAD, 70% prox diag, 40% mid LAD, 30% prox circ, irregularities RCA  2.75 x 12 mm Synergy stent to prox LAD post dil to 2.9 and 2.25 x 12 mm Synergy stent post dial to 2.4 Dr. Herbie [place]    Chronic maxillary sinusitis 02/06/2021   Deviated septum 10/06/2019   Diabetes mellitus due to underlying condition with unspecified complications (HCC) 09/04/2021   Diabetes mellitus without  complication (HCC)    Diverticulitis    Dyslipidemia    ED (erectile dysfunction)    ED (erectile dysfunction) of organic origin 02/06/2021   Encounter for immunization 07/03/2022   Essential hypertension 05/13/2018   Gastroesophageal reflux disease without esophagitis 10/06/2019   Globus pharyngeus 10/06/2019   Hearing loss of left ear 01/12/2020   HTN (hypertension)    Hypercholesteremia 01/03/2019   Hyperglycemia due to  type 1 diabetes mellitus (HCC) 05/13/2018   Hyperlipidemia    Hyperlipidemia due to type 1 diabetes mellitus (HCC) 05/13/2018   Hyperlipidemia, unspecified    Hypothyroid 08/28/2013   Hypothyroidism    Insomnia 02/06/2021   Left lower quadrant pain 02/06/2021   Left-sided tinnitus 01/12/2020   Long term (current) use of insulin (HCC) 02/06/2021   Low back pain 02/06/2021   Mixed dyslipidemia 01/07/2019   Nasal turbinate hypertrophy 10/06/2019   Obstructive sleep apnea syndrome 02/06/2021   Other seborrheic keratosis 09/26/2021   Overweight 02/06/2021   PAF (paroxysmal atrial fibrillation) (HCC) 09/19/2021   Perioral dermatitis 09/26/2021   Peripheral neuropathy 02/06/2021   PONV (postoperative nausea and vomiting)    Pure hypercholesterolemia 02/06/2021   Reactive depression 02/06/2021   Rhinitis, chronic 10/06/2019   Seborrheic dermatitis 07/03/2022   Type 1 diabetes mellitus (HCC) 08/28/2013   Typical atrial flutter (HCC)    Unspecified abnormal finding in specimens from other organs, systems and tissues 02/06/2021    Past Surgical History:  Procedure Laterality Date   A-FLUTTER ABLATION N/A 08/28/2019   Procedure: A-FLUTTER ABLATION;  Surgeon: Regan Lemming, MD;  Location: [facility] INVASIVE CV LAB;  Service: Cardiovascular;  Laterality: N/A;   BACK SURGERY     CERVICAL DISC SURGERY     CORONARY STENT INTERVENTION N/A 05/13/2018   Procedure: CORONARY STENT INTERVENTION;  Surgeon: Marykay Lex, MD;  Location: [facility] INVASIVE CV LAB;  Service: Cardiovascular;  Laterality: N/A;   left hand     LEFT HEART CATH AND CORONARY ANGIOGRAPHY N/A 05/13/2018   Procedure: LEFT HEART CATH AND CORONARY ANGIOGRAPHY;  Surgeon: Marykay Lex, MD;  Location: [facility] INVASIVE CV LAB;  Service: Cardiovascular;  Laterality: N/A;    Current Medications: Current Meds  Medication Sig   acetaminophen (TYLENOL) 500 MG tablet Take 1,000 mg by mouth every 6 (six) hours as needed for moderate pain or headache.   apixaban (ELIQUIS) 5  MG TABS tablet TAKE 1 TABLET TWICE A DAY   Cholecalciferol (VITAMIN D3 PO) Take 1 tablet by mouth at bedtime.   clopidogrel (PLAVIX) 75 MG tablet Take 75 mg by mouth daily.   Cyanocobalamin (VITAMIN B-12 PO) Take 1 tablet by mouth at bedtime.   fluticasone (FLONASE) 50 MCG/ACT nasal spray Place 1 spray into both nostrils daily as needed for allergies.    Glucagon HCl 1 MG SOLR Inject 1 mg into the skin once as needed (low blood sugar).   insulin glargine (LANTUS) 100 UNIT/ML Solostar Pen Inject 11 Units into the skin as needed (for blood sugar if insulin pump cannot be used).   insulin lispro (HUMALOG) 100 UNIT/ML injection Inject 20 Units into the skin 3 (three) times daily as needed for high blood sugar (if insulin pump malfunction).   levothyroxine (SYNTHROID) 137 MCG tablet Take 137 mcg by mouth every morning.   lisinopril (ZESTRIL) 2.5 MG tablet Take 2.5 mg by mouth daily.   loratadine (CLARITIN) 10 MG tablet Take 10 mg by mouth daily as needed for allergies.   LYUMJEV 100 UNIT/ML SOLN Inject 60  Units into the skin daily. Via insulin pump   MAGNESIUM PO Take 1 tablet by mouth at bedtime.   nitroGLYCERIN (NITROSTAT) 0.4 MG SL tablet Place 1 tablet (0.4 mg total) under the tongue every 5 (five) minutes as needed for chest pain.   omega-3 acid ethyl esters (LOVAZA) 1 g capsule Take 2 g by mouth at bedtime.   ranolazine (RANEXA) 1000 MG SR tablet Take 1 tablet (1,000 mg total) by mouth 2 (two) times daily.   rosuvastatin (CRESTOR) 20 MG tablet Take 20 mg by mouth daily.   tadalafil (CIALIS) 20 MG tablet Take 20 mg by mouth daily as needed for erectile dysfunction.   traZODone (DESYREL) 100 MG tablet Take 100 mg by mouth at bedtime.     Allergies:   Atorvastatin, Erythromycin, and Silicone   Social History   Socioeconomic History   Marital status: Married    Spouse name: Not on file   Number of children: Not on file   Years of education: Not on file   Highest education level: Not on  file  Occupational History   Not on file  Tobacco Use   Smoking status: Former   Smokeless tobacco: Never  Substance and Sexual Activity   Alcohol use: Yes   Drug use: No   Sexual activity: Yes  Other Topics Concern   Not on file  Social History Narrative   Not on file   Social Determinants of Health   Financial Resource Strain: Not on file  Food Insecurity: Not on file  Transportation Needs: Not on file  Physical Activity: Not on file  Stress: Not on file  Social Connections: Not on file     Family History: The patient's family history includes Asthma in his father and mother; COPD in his father; CVA in his paternal grandfather; Heart Problems in his father and paternal grandmother; Hyperlipidemia in his mother and paternal grandfather; Lung cancer in his maternal grandfather; Other in his brother; Stroke in his paternal grandfather.  ROS:   Please see the history of present illness.    All other systems reviewed and are negative.  EKGs/Labs/Other Studies Reviewed:    The following studies were reviewed today: EKG reveals sinus rhythm and nonspecific ST-T changes   Recent Labs: 12/14/2021: ALT 18; BUN 5; Creatinine, Ser 1.05; Hemoglobin 12.8; Platelets 240; Potassium 5.0; Sodium 136; TSH 1.100  Recent Lipid Panel    Component Value Date/Time   CHOL 212 (H) 12/14/2021 1108   TRIG 55 12/14/2021 1108   HDL 69 12/14/2021 1108   CHOLHDL 3.1 12/14/2021 1108   LDLCALC 133 (H) 12/14/2021 1108    Physical Exam:    VS:  BP (!) 102/58   Pulse 76   Ht 5' 10.6" (1.793 m)   Wt 171 lb 1.9 oz (77.6 kg)   SpO2 96%   BMI 24.14 kg/m     Wt Readings from Last 3 Encounters:  07/04/22 171 lb 1.9 oz (77.6 kg)  12/14/21 165 lb 0.6 oz (74.9 kg)  09/19/21 172 lb (78 kg)     GEN: Patient is in no acute distress HEENT: Normal NECK: No JVD; No carotid bruits LYMPHATICS: No lymphadenopathy CARDIAC: Hear sounds regular, 2/6 systolic murmur at the apex. RESPIRATORY:  Clear to  auscultation without rales, wheezing or rhonchi  ABDOMEN: Soft, non-tender, non-distended MUSCULOSKELETAL:  No edema; No deformity  SKIN: Warm and dry NEUROLOGIC:  Alert and oriented x 3 PSYCHIATRIC:  Normal affect   Signed, Jenean Lindau, MD  07/04/2022 3:18 PM    [facility]

## 2022-08-20 ENCOUNTER — Telehealth: Payer: Self-pay | Admitting: Cardiology

## 2022-08-20 DIAGNOSIS — I2510 Atherosclerotic heart disease of native coronary artery without angina pectoris: Secondary | ICD-10-CM

## 2022-08-20 MED ORDER — CLOPIDOGREL BISULFATE 75 MG PO TABS: 75.0000 mg | ORAL_TABLET | Freq: Every day | ORAL | 3 refills | Status: DC

## 2022-08-20 NOTE — Telephone Encounter (Signed)
*  STAT* If patient is at the pharmacy, call can be transferred to refill team.   1. Which medications need to be refilled? (please list name of each medication and dose if known)   clopidogrel (PLAVIX) 75 MG tablet    2. Which pharmacy/location (including street and city if local pharmacy) is medication to be sent to?   [place], [place] - [address] ( Pt wants it sent here because he has to leave town)   3. Do they need a 30 day or 90 day supply? [place]

## 2022-08-20 NOTE — Telephone Encounter (Signed)
RX sent

## 2022-09-28 ENCOUNTER — Other Ambulatory Visit: Payer: Self-pay | Admitting: Cardiology

## 2022-11-27 ENCOUNTER — Other Ambulatory Visit: Payer: Self-pay

## 2022-11-27 DIAGNOSIS — I480 Paroxysmal atrial fibrillation: Secondary | ICD-10-CM

## 2022-11-27 MED ORDER — APIXABAN 5 MG PO TABS: 5.0000 mg | ORAL_TABLET | Freq: Two times a day (BID) | ORAL | 1 refills | Status: DC

## 2022-11-27 NOTE — Telephone Encounter (Signed)
Pt last saw Dr Geraldo Pitter 07/04/22, last labs 12/14/21 Creat 1.05, age 67, weight 77.6kg, based on specified criteria pt is on appropriate dosage of Eliquis 5mg  BID for PAF.  Will refill rx.

## 2022-11-28 ENCOUNTER — Other Ambulatory Visit: Payer: Self-pay

## 2022-11-28 MED ORDER — RANOLAZINE ER 1000 MG PO TB12: 1000.0000 mg | ORAL_TABLET | Freq: Two times a day (BID) | ORAL | 2 refills | Status: DC

## 2022-12-25 ENCOUNTER — Other Ambulatory Visit: Payer: Self-pay

## 2022-12-25 MED ORDER — RANOLAZINE ER 1000 MG PO TB12: 1000.0000 mg | ORAL_TABLET | Freq: Two times a day (BID) | ORAL | 1 refills | Status: DC

## 2023-01-24 ENCOUNTER — Other Ambulatory Visit: Payer: Self-pay | Admitting: Cardiology

## 2023-04-24 ENCOUNTER — Other Ambulatory Visit: Payer: Self-pay | Admitting: Cardiology

## 2023-05-31 ENCOUNTER — Emergency Department (HOSPITAL_COMMUNITY)
Admission: EM | Admit: 2023-05-31 | Discharge: 2023-06-01 | Disposition: A | Discharge status: Home / Self Care | Payer: Medicare Other | Attending: Emergency Medicine | Admitting: Emergency Medicine

## 2023-05-31 ENCOUNTER — Emergency Department (HOSPITAL_COMMUNITY): Payer: Medicare Other

## 2023-05-31 DIAGNOSIS — I2510 Atherosclerotic heart disease of native coronary artery without angina pectoris: Secondary | ICD-10-CM | POA: Insufficient documentation

## 2023-05-31 DIAGNOSIS — Z79899 Other long term (current) drug therapy: Secondary | ICD-10-CM | POA: Insufficient documentation

## 2023-05-31 DIAGNOSIS — Z7902 Long term (current) use of antithrombotics/antiplatelets: Secondary | ICD-10-CM | POA: Insufficient documentation

## 2023-05-31 DIAGNOSIS — E039 Hypothyroidism, unspecified: Secondary | ICD-10-CM | POA: Diagnosis not present

## 2023-05-31 DIAGNOSIS — E119 Type 2 diabetes mellitus without complications: Secondary | ICD-10-CM | POA: Diagnosis not present

## 2023-05-31 DIAGNOSIS — Z794 Long term (current) use of insulin: Secondary | ICD-10-CM | POA: Insufficient documentation

## 2023-05-31 DIAGNOSIS — I10 Essential (primary) hypertension: Secondary | ICD-10-CM | POA: Insufficient documentation

## 2023-05-31 DIAGNOSIS — R55 Syncope and collapse: Secondary | ICD-10-CM | POA: Insufficient documentation

## 2023-05-31 DIAGNOSIS — Z7901 Long term (current) use of anticoagulants: Secondary | ICD-10-CM | POA: Diagnosis not present

## 2023-05-31 LAB — COMPREHENSIVE METABOLIC PANEL
ALT: 25 U/L (ref 0–44)
AST: 17 U/L (ref 15–41)
Albumin: 3.7 g/dL (ref 3.5–5.0)
Alkaline Phosphatase: 37 U/L — ABNORMAL LOW (ref 38–126)
Anion gap: 10 (ref 5–15)
BUN: 13 mg/dL (ref 8–23)
CO2: 24 mmol/L (ref 22–32)
Calcium: 8.3 mg/dL — ABNORMAL LOW (ref 8.9–10.3)
Chloride: 103 mmol/L (ref 98–111)
Creatinine, Ser: 0.95 mg/dL (ref 0.61–1.24)
GFR, Estimated: >60 mL/min (ref >60)
Glucose, Bld: 73 mg/dL (ref 70–99)
Potassium: 3.2 mmol/L — ABNORMAL LOW (ref 3.5–5.1)
Sodium: 137 mmol/L (ref 135–145)
Total Bilirubin: 0.8 mg/dL (ref 0.3–1.2)
Total Protein: 6.5 g/dL (ref 6.5–8.1)

## 2023-05-31 LAB — CBC WITH DIFFERENTIAL/PLATELET
Abs Immature Granulocytes: 0.06 10*3/uL (ref 0.00–0.07)
Basophils Absolute: 0 10*3/uL (ref 0.0–0.1)
Basophils Relative: 1 %
Eosinophils Absolute: 0.1 10*3/uL (ref 0.0–0.5)
Eosinophils Relative: 1 %
HCT: 36.1 % — ABNORMAL LOW (ref 39.0–52.0)
Hemoglobin: 12.4 g/dL — ABNORMAL LOW (ref 13.0–17.0)
Immature Granulocytes: 1 %
Lymphocytes Relative: 33 %
Lymphs Abs: 1.7 10*3/uL (ref 0.7–4.0)
MCH: 33.4 pg (ref 26.0–34.0)
MCHC: 34.3 g/dL (ref 30.0–36.0)
MCV: 97.3 fL (ref 80.0–100.0)
Monocytes Absolute: 0.4 10*3/uL (ref 0.1–1.0)
Monocytes Relative: 9 %
Neutro Abs: 2.9 10*3/uL (ref 1.7–7.7)
Neutrophils Relative %: 55 %
Platelets: 191 10*3/uL (ref 150–400)
RBC: 3.71 MIL/uL — ABNORMAL LOW (ref 4.22–5.81)
RDW: 12.2 % (ref 11.5–15.5)
WBC: 5.2 10*3/uL (ref 4.0–10.5)
nRBC: 0 % (ref 0.0–0.2)

## 2023-05-31 LAB — TROPONIN I (HIGH SENSITIVITY): Troponin I (High Sensitivity): 2 ng/L (ref <18)

## 2023-05-31 LAB — CBG MONITORING, ED: Glucose-Capillary: 79 mg/dL (ref 70–99)

## 2023-05-31 MED ORDER — DEXTROSE 50 % IV SOLN
1.0000 | Freq: Once | INTRAVENOUS | Status: AC
  Administered 2023-05-31: 50 mL via INTRAVENOUS
  Filled 2023-05-31: qty 50

## 2023-05-31 MED ORDER — METOCLOPRAMIDE HCL 5 MG/ML IJ SOLN
10.0000 mg | INTRAMUSCULAR | Status: AC
  Administered 2023-05-31: 10 mg via INTRAVENOUS
  Filled 2023-05-31: qty 2

## 2023-05-31 NOTE — ED Provider Notes (Signed)
[place] EMERGENCY DEPARTMENT AT [facility] Provider Note   CSN: [id number] Arrival date & time: 05/31/23  2202     History {Add pertinent medical, surgical, social history, OB history to HPI:1} Chief Complaint  Patient presents with   Loss of Consciousness   Hypotension    Mark Shah is a 67 y.o. male.   Loss of Consciousness  67 year old male with history of hypertension, diabetes, coronary artery disease, paroxysmal atrial flutter, hypothyroidism, hyperlipidemia, presenting to the ED after syncopal event.  Had usual day today, went out to dinner with wife this evening.  After sitting down on the table he voices that he was feeling a little lightheaded and had some pressure in his left ear.  They ate dinner and shortly after finishing he had a syncopal event in the chair.  He did not fall from the chair, strike his head.  He was unconscious for about 1 minute before coming to.  Wife states upon waking he immediately began vomiting.  Turns out his insulin pump was not functioning properly so not sure if he received a large bolus of insulin prior to dinner not.  He does recall feeling like he was going to pass out prior to this happening but does not recall other events.  Currently still feels quite nauseated even after Zofran with EMS.  He has had similar syncopal events in the past related to low blood sugar.  He has not had any chest pain or shortness of breath.  No fever or chills.  Home Medications Prior to Admission medications   Medication Sig Start Date End Date Taking? Authorizing Provider  acetaminophen (TYLENOL) 500 MG tablet Take 1,000 mg by mouth every 6 (six) hours as needed for moderate pain or headache.    [provider]  apixaban (ELIQUIS) 5 MG TABS tablet Take 1 tablet (5 mg total) by mouth 2 (two) times daily. 11/27/22   Revankar, Aundra Dubin, MD  Cholecalciferol (VITAMIN D3 PO) Take 1 tablet by mouth at bedtime.    [provider]   clopidogrel (PLAVIX) 75 MG tablet TAKE 1 TABLET DAILY 04/24/23   Revankar, Aundra Dubin, MD  Cyanocobalamin (VITAMIN B-12 PO) Take 1 tablet by mouth at bedtime.    [provider]  fluticasone (FLONASE) 50 MCG/ACT nasal spray Place 1 spray into both nostrils daily as needed for allergies.  12/28/15   [provider]  Glucagon HCl 1 MG SOLR Inject 1 mg into the skin once as needed (low blood sugar).    [provider]  insulin glargine (LANTUS) 100 UNIT/ML Solostar Pen Inject 11 Units into the skin as needed (for blood sugar if insulin pump cannot be used).    [provider]  insulin lispro (HUMALOG) 100 UNIT/ML injection Inject 20 Units into the skin 3 (three) times daily as needed for high blood sugar (if insulin pump malfunction).    [provider]  levothyroxine (SYNTHROID) 137 MCG tablet Take 137 mcg by mouth every morning. 11/21/21   [provider]  lisinopril (ZESTRIL) 2.5 MG tablet Take 2.5 mg by mouth daily. 09/07/21   [provider]  loratadine (CLARITIN) 10 MG tablet Take 10 mg by mouth daily as needed for allergies.    [provider]  LYUMJEV 100 UNIT/ML SOLN Inject 60 Units into the skin daily. Via insulin pump 01/26/21   [provider]  MAGNESIUM PO Take 1 tablet by mouth at bedtime.    [provider]  nitroGLYCERIN (NITROSTAT)  0.4 MG SL tablet Place 1 tablet (0.4 mg total) under the tongue every 5 (five) minutes as needed for chest pain. 09/04/21   Revankar, Aundra Dubin, MD  omega-3 acid ethyl esters (LOVAZA) 1 g capsule Take 2 g by mouth at bedtime.    [provider]  ranolazine (RANEXA) 1000 MG SR tablet Take 1 tablet (1,000 mg total) by mouth 2 (two) times daily. 12/25/22   Revankar, Aundra Dubin, MD  rosuvastatin (CRESTOR) 20 MG tablet Take 20 mg by mouth daily.    [provider]  tadalafil (CIALIS) 20 MG tablet Take 20 mg by mouth daily as needed for erectile dysfunction. 04/26/22    [provider]  traZODone (DESYREL) 100 MG tablet Take 100 mg by mouth at bedtime. 04/26/22   [provider]      Allergies    Atorvastatin, Erythromycin, and Silicone    Review of Systems   Review of Systems  Cardiovascular:  Positive for syncope.    Physical Exam Updated Vital Signs BP 123/76 (BP Location: Right Arm)   Pulse 82   Temp 97.6 F (36.4 C) (Oral)   Resp 14   SpO2 98%  Physical Exam Vitals and nursing note reviewed.  Constitutional:      Appearance: He is well-developed.     Comments: Dry heaving on arrival  HENT:     Head: Normocephalic and atraumatic.  Eyes:     Conjunctiva/sclera: Conjunctivae normal.     Pupils: Pupils are equal, round, and reactive to light.  Cardiovascular:     Rate and Rhythm: Normal rate and regular rhythm.     Heart sounds: Normal heart sounds.  Pulmonary:     Effort: Pulmonary effort is normal. No respiratory distress.     Breath sounds: Normal breath sounds. No rhonchi.  Abdominal:     General: Bowel sounds are normal.     Palpations: Abdomen is soft.  Musculoskeletal:        General: Normal range of motion.     Cervical back: Normal range of motion.  Skin:    General: Skin is warm and dry.  Neurological:     Mental Status: He is alert and oriented to person, place, and time.     Comments: AAOx3, answering questions and following commands appropriately; equal strength UE and LE bilaterally; CN grossly intact; moves all extremities appropriately without ataxia; no focal neuro deficits or facial asymmetry appreciated     ED Results / Procedures / Treatments   Labs (all labs ordered are listed, but only abnormal results are displayed) Labs Reviewed  CBC WITH DIFFERENTIAL/PLATELET  COMPREHENSIVE METABOLIC PANEL  URINALYSIS, ROUTINE W REFLEX MICROSCOPIC  CBG MONITORING, ED  TROPONIN I (HIGH SENSITIVITY)    EKG EKG Interpretation Date/Time:  Friday May 31 2023 22:18:48 EDT Ventricular Rate:  79 PR  Interval:  208 QRS Duration:  143 QT Interval:  430 QTC Calculation: 493 R Axis:   20  Text Interpretation: Sinus rhythm Probable left atrial enlargement Right bundle branch block Confirmed by Virgina Norfolk [phone]) on 05/31/2023 10:25:33 PM  Radiology No results found.  Procedures Procedures  {Document cardiac monitor, telemetry assessment procedure when appropriate:1}  Medications Ordered in ED Medications  metoCLOPramide (REGLAN) injection 10 mg (has no administration in time range)  dextrose 50 % solution 50 mL (has no administration in time range)    ED Course/ Medical Decision Making/ A&P   {   Click here for ABCD2, HEART and other calculatorsREFRESH Note before signing :  1}                          Medical Decision Making Amount and/or Complexity of Data Reviewed Labs: ordered. Radiology: ordered.  Risk Prescription drug management.   ***  {Document critical care time when appropriate:1} {Document review of labs and clinical decision tools ie heart score, Chads2Vasc2 etc:1}  {Document your independent review of radiology images, and any outside records:1} {Document your discussion with family members, caretakers, and with consultants:1} {Document social determinants of health affecting pt's care:1} {Document your decision making why or why not admission, treatments were needed:1} Final Clinical Impression(s) / ED Diagnoses Final diagnoses:  None    Rx / DC Orders ED Discharge Orders     None

## 2023-05-31 NOTE — ED Triage Notes (Signed)
Patient here from home reporting syncopal episode today with hypotension via EMS. Reports hx of same with low BS.

## 2023-06-01 ENCOUNTER — Encounter (HOSPITAL_COMMUNITY): Payer: Self-pay

## 2023-06-01 ENCOUNTER — Other Ambulatory Visit: Payer: Self-pay

## 2023-06-01 DIAGNOSIS — R55 Syncope and collapse: Secondary | ICD-10-CM | POA: Diagnosis not present

## 2023-06-01 LAB — CBG MONITORING, ED
Glucose-Capillary: 129 mg/dL — ABNORMAL HIGH (ref 70–99)
Glucose-Capillary: 183 mg/dL — ABNORMAL HIGH (ref 70–99)

## 2023-06-01 NOTE — Discharge Instructions (Signed)
We suspect you likely had an episode of hypoglycemia leading to your syncope. Labs today were reassuring and glucose has stabilized. Make sure to rest, eat well, hydrate over the weekend. Follow-up with your doctor if any ongoing issues. Return here for new concerns.

## 2023-06-05 ENCOUNTER — Other Ambulatory Visit: Payer: Self-pay

## 2023-06-13 ENCOUNTER — Encounter: Payer: Self-pay | Admitting: Cardiology

## 2023-06-13 ENCOUNTER — Ambulatory Visit: Payer: Medicare Other | Attending: Cardiology | Admitting: Cardiology

## 2023-06-13 VITALS — BP 128/70 | HR 72 | Ht 71.0 in | Wt 193.1 lb

## 2023-06-13 DIAGNOSIS — I480 Paroxysmal atrial fibrillation: Secondary | ICD-10-CM | POA: Diagnosis present

## 2023-06-13 DIAGNOSIS — I10 Essential (primary) hypertension: Secondary | ICD-10-CM

## 2023-06-13 DIAGNOSIS — I2510 Atherosclerotic heart disease of native coronary artery without angina pectoris: Secondary | ICD-10-CM | POA: Insufficient documentation

## 2023-06-13 DIAGNOSIS — E088 Diabetes mellitus due to underlying condition with unspecified complications: Secondary | ICD-10-CM

## 2023-06-13 DIAGNOSIS — E782 Mixed hyperlipidemia: Secondary | ICD-10-CM

## 2023-06-13 NOTE — Patient Instructions (Addendum)
Medication Instructions:  Your physician recommends that you continue on your current medications as directed. Please refer to the Current Medication list given to you today.  *If you need a refill on your cardiac medications before your next appointment, please call your pharmacy*   Lab Work: Your physician recommends that you return for lab work in: the morning of your stress test for CMP and lipids. You need to have labs done when you are fasting.  You can come Monday through Friday 8:30 am to 12:00 pm and 1:15 to 4:30. You do not need to make an appointment as the order has already been placed. If you have labs (blood work) drawn today and your tests are completely normal, you will receive your results only by: MyChart Message (if you have MyChart) OR A paper copy in the mail If you have any lab test that is abnormal or we need to change your treatment, we will call you to review the results.   Testing/Procedures: You are scheduled for a Myocardial Perfusion Imaging Study.  Please arrive 15 minutes prior to your appointment time for registration and insurance purposes.  The test will take approximately 3 to 4 hours to complete; you may bring reading material.  If someone comes with you to your appointment, they will need to remain in the main lobby due to limited space in the testing area.   How to prepare for your Myocardial Perfusion Test: Do not eat or drink after midnight prior to your test, except you may have water. Do not consume products containing caffeine (regular or decaffeinated) 12 hours prior to your test. (ex: coffee, chocolate, sodas, tea). Do bring a list of your current medications with you.  If not listed below, you may take your medications as normal. Do wear comfortable clothes (no dresses or overalls) and walking shoes, tennis shoes preferred (No heels or open toe shoes are allowed). Do NOT wear cologne, perfume, aftershave, or lotions (deodorant is allowed). If  these instructions are not followed, your test will have to be rescheduled.  If you cannot keep your appointment, please provide 24 hours notification to the Nuclear Lab, to avoid a possible $50 charge to your account.  Follow-Up: At [facility], you and your health needs are our priority.  As part of our continuing mission to provide you with exceptional heart care, we have created designated Provider Care Teams.  These Care Teams include your primary Cardiologist (physician) and Advanced Practice Providers (APPs -  Physician Assistants and Nurse Practitioners) who all work together to provide you with the care you need, when you need it.  We recommend signing up for the patient portal called "MyChart".  Sign up information is provided on this After Visit Summary.  MyChart is used to connect with patients for Virtual Visits (Telemedicine).  Patients are able to view lab/test results, encounter notes, upcoming appointments, etc.  Non-urgent messages can be sent to your provider as well.   To learn more about what you can do with MyChart, go to [email or web address].    Your next appointment:   9 month(s)  Provider:   Belva Crome, MD   Other Instructions  Cardiac Nuclear Scan A cardiac nuclear scan is a test that is done to check the flow of blood to your heart. It is done when you are resting and when you are exercising. The test looks for problems such as: Not enough blood reaching a portion of the heart. The heart muscle not working as  it should. You may need this test if you have: Heart disease. Lab results that are not normal. Had heart surgery or a balloon procedure to open up blocked arteries (angioplasty) or a small mesh tube (stent). Chest pain. Shortness of breath. Had a heart attack. In this test, a special dye (tracer) is put into your bloodstream. The tracer will travel to your heart. A camera will then take pictures of your heart to see how the tracer moves  through your heart. This test is usually done at a hospital and takes 2-4 hours. Tell a doctor about: Any allergies you have. All medicines you are taking, including vitamins, herbs, eye drops, creams, and over-the-counter medicines. Any bleeding problems you have. Any surgeries you have had. Any medical conditions you have. Whether you are pregnant or may be pregnant. Any history of asthma or long-term (chronic) lung disease. Any history of heart rhythm disorders or heart valve conditions. What are the risks? Your doctor will talk with you about risks. These may include: Serious chest pain and heart attack. This is only a risk if the stress portion of the test is done. Fast or uneven heartbeats (palpitations). A feeling of warmth in your chest. This feeling usually does not last long. Allergic reaction to the tracer. Shortness of breath or trouble breathing. What happens before the test? Ask your doctor about changing or stopping your normal medicines. Follow instructions from your doctor about what you cannot eat or drink. Remove your jewelry on the day of the test. Ask your doctor if you need to avoid nicotine or caffeine. What happens during the test? An IV tube will be inserted into one of your veins. Your doctor will give you a small amount of tracer through the IV tube. You will wait for 20-40 minutes while the tracer moves through your bloodstream. Your heart will be monitored with an electrocardiogram (ECG). You will lie down on an exam table. Pictures of your heart will be taken for about 15-20 minutes. You may also have a stress test. For this test, one of these things may be done: You will be asked to exercise on a treadmill or a stationary bike. You will be given medicines that will make your heart work harder. This is done if you are unable to exercise. When blood flow to your heart has peaked, a tracer will again be given through the IV tube. After 20-40 minutes, you  will get back on the exam table. More pictures will be taken of your heart. Depending on the tracer that is used, more pictures may need to be taken 3-4 hours later. Your IV tube will be removed when the test is over. The test may vary among doctors and hospitals. What happens after the test? Ask your doctor: Whether you can return to your normal schedule, including diet, activities, travel, and medicines. Whether you should drink more fluids. This will help to remove the tracer from your body. Ask your doctor, or the department that is doing the test: When will my results be ready? How will I get my results? What are my treatment options? What other tests do I need? What are my next steps? This information is not intended to replace advice given to you by your health care provider. Make sure you discuss any questions you have with your health care provider. Document Revised: 03/20/2022 Document Reviewed: 03/20/2022 Elsevier Patient Education  2023 [employer].

## 2023-06-13 NOTE — Progress Notes (Signed)
Cardiology Office Note:    Date:  06/13/2023   ID:  [name], DOB 1956/10/26, MRN [id number]  PCP:  Darrow Bussing, MD  Cardiologist:  Garwin Brothers, MD   Referring MD: Darrow Bussing, MD    ASSESSMENT:    1. PAF (paroxysmal atrial fibrillation) (HCC)   2. Coronary artery disease involving native coronary artery of native heart without angina pectoris   3. Primary hypertension   4. Mixed dyslipidemia   5. Diabetes mellitus due to underlying condition with unspecified complications (HCC)    PLAN:    In order of problems listed above:  Coronary artery disease: Secondary prevention stressed to the patient.  Importance of compliance with diet medications stressed and vocalized understanding.  He was advised to exercise on a regular basis at least half an hour a day.  In view of syncopal episode and existing coronary artery disease and the fact that he is a diabetic, I would like to do a exercise stress Cardiolite and he is agreeable. Essential hypertension: Blood pressure is stable and diet was emphasized. Mixed dyslipidemia: On lipid-lowering medications.  Lipids are not at goal.  When he comes for the stress test will repeat lab work and I will increase his statins if necessary.  Diet and exercise stressed. Patient will be seen in follow-up appointment in 6 months or earlier if the patient has any concerns. Per his request nitroglycerin was refilled.   Medication Adjustments/Labs and Tests Ordered: Current medicines are reviewed at length with the patient today.  Concerns regarding medicines are outlined above.  No orders of the defined types were placed in this encounter.  No orders of the defined types were placed in this encounter.    No chief complaint on file.    History of Present Illness:    Mark Shah is a 67 y.o. male.  Patient has past medical history of paroxysmal atrial fibrillation, coronary artery disease, essential hypertension, mixed  dyslipidemia and diabetes mellitus.  He had a syncopal event and it was felt to be due to hypoglycemia.  Subsequently he is active and has had no problems.  No chest pain orthopnea or PND.  At the time of my evaluation, the patient is alert awake oriented and in no distress.  Past Medical History:  Diagnosis Date   Acquired hypothyroidism 08/28/2013   Adhesive capsulitis    in shoulders   Atherosclerosis of coronary artery 05/14/2018   Formatting of this note might be different from the original. Formatting of this note might be different from the original. Cath 05/13/18 LM normal, ostial LAD 25%, 95% prox LAD, 70% prox diag, 40% mid LAD, 30% prox circ, irregularities RCA  2.75 x 12 mm Synergy stent to prox LAD post dil to 2.9 and 2.25 x 12 mm Synergy stent post dial to 2.4 Dr. Herbie [place]   Atherosclerotic heart disease of native coronary artery without angina pectoris 02/06/2021   Atrial fibrillation with RVR (HCC) 01/03/2019   Atrial flutter, paroxysmal (HCC) 01/07/2019   CAD (coronary artery disease), native coronary artery 05/14/2018   Cath 05/13/18 LM normal, ostial LAD 25%, 95% prox LAD, 70% prox diag, 40% mid LAD, 30% prox circ, irregularities RCA  2.75 x 12 mm Synergy stent to prox LAD post dil to 2.9 and 2.25 x 12 mm Synergy stent post dial to 2.4 Dr. Herbie Baltimore    Chronic maxillary sinusitis 02/06/2021   Deviated septum 10/06/2019   Diabetes mellitus due to underlying condition with unspecified complications (HCC) 09/04/2021  Diabetes mellitus without complication (HCC)    Diverticulitis    Dyslipidemia    ED (erectile dysfunction)    ED (erectile dysfunction) of organic origin 02/06/2021   Encounter for immunization 07/03/2022   Essential hypertension 05/13/2018   Gastroesophageal reflux disease without esophagitis 10/06/2019   Globus pharyngeus 10/06/2019   Hearing loss of left ear 01/12/2020   HTN (hypertension)    Hypercholesteremia 01/03/2019   Hyperglycemia due to type 1 diabetes mellitus (HCC)  05/13/2018   Hyperlipidemia    Hyperlipidemia due to type 1 diabetes mellitus (HCC) 05/13/2018   Hyperlipidemia, unspecified    Hypothyroid 08/28/2013   Hypothyroidism    Insomnia 02/06/2021   Left lower quadrant pain 02/06/2021   Left-sided tinnitus 01/12/2020   Long term (current) use of insulin (HCC) 02/06/2021   Low back pain 02/06/2021   Mixed dyslipidemia 01/07/2019   Nasal turbinate hypertrophy 10/06/2019   Obstructive sleep apnea syndrome 02/06/2021   Other seborrheic keratosis 09/26/2021   Overweight 02/06/2021   PAF (paroxysmal atrial fibrillation) (HCC) 09/19/2021   Perioral dermatitis 09/26/2021   Peripheral neuropathy 02/06/2021   PONV (postoperative nausea and vomiting)    Pure hypercholesterolemia 02/06/2021   Reactive depression 02/06/2021   Rhinitis, chronic 10/06/2019   Seborrheic dermatitis 07/03/2022   Type 1 diabetes mellitus (HCC) 08/28/2013   Typical atrial flutter (HCC)    Unspecified abnormal finding in specimens from other organs, systems and tissues 02/06/2021    Past Surgical History:  Procedure Laterality Date   A-FLUTTER ABLATION N/A 08/28/2019   Procedure: A-FLUTTER ABLATION;  Surgeon: Regan Lemming, MD;  Location: [facility] INVASIVE CV LAB;  Service: Cardiovascular;  Laterality: N/A;   BACK SURGERY     CERVICAL DISC SURGERY     CORONARY STENT INTERVENTION N/A 05/13/2018   Procedure: CORONARY STENT INTERVENTION;  Surgeon: Marykay Lex, MD;  Location: [facility] INVASIVE CV LAB;  Service: Cardiovascular;  Laterality: N/A;   left hand     LEFT HEART CATH AND CORONARY ANGIOGRAPHY N/A 05/13/2018   Procedure: LEFT HEART CATH AND CORONARY ANGIOGRAPHY;  Surgeon: Marykay Lex, MD;  Location: [facility] INVASIVE CV LAB;  Service: Cardiovascular;  Laterality: N/A;    Current Medications: Current Meds  Medication Sig   acetaminophen (TYLENOL) 500 MG tablet Take 1,000 mg by mouth every 6 (six) hours as needed for moderate pain or headache.   apixaban (ELIQUIS) 5 MG TABS tablet Take 1 tablet (5  mg total) by mouth 2 (two) times daily.   clopidogrel (PLAVIX) 75 MG tablet Take 75 mg by mouth daily.   Cyanocobalamin (VITAMIN B-12 PO) Take 1 tablet by mouth at bedtime.   fluticasone (FLONASE) 50 MCG/ACT nasal spray Place 1 spray into both nostrils daily as needed for allergies.    Glucagon HCl 1 MG SOLR Inject 1 mg into the skin once as needed (low blood sugar).   levothyroxine (SYNTHROID) 137 MCG tablet Take 137 mcg by mouth every morning.   loratadine (CLARITIN) 10 MG tablet Take 10 mg by mouth daily as needed for allergies.   LYUMJEV 100 UNIT/ML SOLN Inject 60 Units into the skin daily. Via insulin pump   MAGNESIUM PO Take 1 tablet by mouth at bedtime.   nitroGLYCERIN (NITROSTAT) 0.4 MG SL tablet Place 1 tablet (0.4 mg total) under the tongue every 5 (five) minutes as needed for chest pain.   omega-3 acid ethyl esters (LOVAZA) 1 g capsule Take 2 g by mouth at bedtime.   ranolazine (RANEXA) 1000 MG SR tablet Take  1 tablet (1,000 mg total) by mouth 2 (two) times daily.   rosuvastatin (CRESTOR) 20 MG tablet Take 20 mg by mouth daily.   tadalafil (CIALIS) 20 MG tablet Take 20 mg by mouth daily as needed for erectile dysfunction.   traZODone (DESYREL) 100 MG tablet Take 100 mg by mouth at bedtime.     Allergies:   Atorvastatin, Erythromycin, and Silicone   Social History   Socioeconomic History   Marital status: Married    Spouse name: Not on file   Number of children: Not on file   Years of education: Not on file   Highest education level: Not on file  Occupational History   Not on file  Tobacco Use   Smoking status: Former    Passive exposure: Past   Smokeless tobacco: Never  Vaping Use   Vaping status: Never Used  Substance and Sexual Activity   Alcohol use: Yes   Drug use: No   Sexual activity: Yes  Other Topics Concern   Not on file  Social History Narrative   Not on file   Social Determinants of Health   Financial Resource Strain: Not on file  Food Insecurity:  Not on file  Transportation Needs: Not on file  Physical Activity: Not on file  Stress: Not on file  Social Connections: Not on file     Family History: The patient's family history includes Asthma in his father and mother; COPD in his father; CVA in his paternal grandfather; Heart Problems in his father and paternal grandmother; Hyperlipidemia in his mother and paternal grandfather; Lung cancer in his maternal grandfather; Other in his brother; Stroke in his paternal grandfather.  ROS:   Please see the history of present illness.    All other systems reviewed and are negative.  EKGs/Labs/Other Studies Reviewed:    The following studies were reviewed today: I discussed my findings with the patient at length   Recent Labs: 05/31/2023: ALT 25; BUN 13; Creatinine, Ser 0.95; Hemoglobin 12.4; Platelets 191; Potassium 3.2; Sodium 137  Recent Lipid Panel    Component Value Date/Time   CHOL 212 (H) 12/14/2021 1108   TRIG 55 12/14/2021 1108   HDL 69 12/14/2021 1108   CHOLHDL 3.1 12/14/2021 1108   LDLCALC 133 (H) 12/14/2021 1108    Physical Exam:    VS:  BP 128/70   Pulse 72   Ht 5\' 11"  (1.803 m)   Wt 193 lb 1.9 oz (87.6 kg)   SpO2 97%   BMI 26.93 kg/m     Wt Readings from Last 3 Encounters:  06/13/23 193 lb 1.9 oz (87.6 kg)  07/04/22 171 lb 1.9 oz (77.6 kg)  12/14/21 165 lb 0.6 oz (74.9 kg)     GEN: Patient is in no acute distress HEENT: Normal NECK: No JVD; No carotid bruits LYMPHATICS: No lymphadenopathy CARDIAC: Hear sounds regular, 2/6 systolic murmur at the apex. RESPIRATORY:  Clear to auscultation without rales, wheezing or rhonchi  ABDOMEN: Soft, non-tender, non-distended MUSCULOSKELETAL:  No edema; No deformity  SKIN: Warm and dry NEUROLOGIC:  Alert and oriented x 3 PSYCHIATRIC:  Normal affect   Signed, Garwin Brothers, MD  06/13/2023 4:14 PM    [facility]

## 2023-06-14 ENCOUNTER — Telehealth (HOSPITAL_COMMUNITY): Payer: Self-pay | Admitting: *Deleted

## 2023-06-14 NOTE — Telephone Encounter (Signed)
Patient given detailed instructions per Myocardial Perfusion Study Information Sheet for the test on 06/17/2023 at 7:45. Patient notified to arrive 15 minutes early and that it is imperative to arrive on time for appointment to keep from having the test rescheduled.  If you need to cancel or reschedule your appointment, please call the office within 24 hours of your appointment. . Patient verbalized understanding.Mark Shah

## 2023-06-17 ENCOUNTER — Ambulatory Visit: Payer: Medicare Other

## 2023-06-17 ENCOUNTER — Ambulatory Visit (HOSPITAL_COMMUNITY): Payer: Medicare Other | Attending: Cardiology

## 2023-06-17 DIAGNOSIS — I2510 Atherosclerotic heart disease of native coronary artery without angina pectoris: Secondary | ICD-10-CM | POA: Diagnosis present

## 2023-06-17 DIAGNOSIS — I480 Paroxysmal atrial fibrillation: Secondary | ICD-10-CM | POA: Diagnosis present

## 2023-06-17 LAB — MYOCARDIAL PERFUSION IMAGING
Angina Index: 0
Duke Treadmill Score: 8
Estimated workload: 10.1
Exercise duration (min): 8 min
Exercise duration (sec): 15 s
LV dias vol: 80 mL (ref 62–150)
LV sys vol: 34 mL
MPHR: 153 {beats}/min
Nuc Stress EF: 57 %
Peak HR: 131 {beats}/min
Percent HR: 86 %
RPE: 19
Rest HR: 58 {beats}/min
Rest Nuclear Isotope Dose: 10.8 mCi
SDS: 1
SRS: 0
SSS: 1
ST Depression (mm): 0 mm
Stress Nuclear Isotope Dose: 32 mCi
TID: 0.99

## 2023-06-17 MED ORDER — TECHNETIUM TC 99M TETROFOSMIN IV KIT
32.0000 | PACK | Freq: Once | INTRAVENOUS | Status: AC | PRN
  Administered 2023-06-17: 32 via INTRAVENOUS

## 2023-06-17 MED ORDER — TECHNETIUM TC 99M TETROFOSMIN IV KIT
10.8000 | PACK | Freq: Once | INTRAVENOUS | Status: AC | PRN
  Administered 2023-06-17: 10.8 via INTRAVENOUS

## 2023-06-20 ENCOUNTER — Telehealth: Payer: Self-pay

## 2023-06-20 DIAGNOSIS — I2510 Atherosclerotic heart disease of native coronary artery without angina pectoris: Secondary | ICD-10-CM

## 2023-06-20 DIAGNOSIS — E782 Mixed hyperlipidemia: Secondary | ICD-10-CM

## 2023-06-20 MED ORDER — ROSUVASTATIN CALCIUM 40 MG PO TABS
40.0000 mg | ORAL_TABLET | Freq: Every day | ORAL | 3 refills | Status: DC
Start: 2023-06-20 — End: 2023-07-18

## 2023-06-20 NOTE — Telephone Encounter (Signed)
-----   Message from Aundra Dubin Revankar sent at 06/19/2023  1:08 PM EDT ----- Lipids are elevated.  Diet and exercise.  Double statin and liver lipid in 6 weeks.  Copy primary care Garwin Brothers, MD 06/19/2023 1:08 PM

## 2023-06-21 ENCOUNTER — Telehealth: Payer: Self-pay

## 2023-06-21 NOTE — Telephone Encounter (Signed)
Left vm to callback  Normal stress test.  Lipids are elevated.  Diet and exercise.  Double statin and liver lipid in 6 weeks.

## 2023-06-21 NOTE — Telephone Encounter (Signed)
-----   Message from Aundra Dubin Revankar sent at 06/19/2023 11:46 AM EDT ----- The results of the study is unremarkable. Please inform patient. I will discuss in detail at next appointment. Cc  primary care/referring physician Garwin Brothers, MD 06/19/2023 11:46 AM

## 2023-07-10 ENCOUNTER — Ambulatory Visit (INDEPENDENT_AMBULATORY_CARE_PROVIDER_SITE_OTHER): Payer: Medicare Other | Admitting: Podiatry

## 2023-07-10 ENCOUNTER — Encounter: Payer: Self-pay | Admitting: Podiatry

## 2023-07-10 DIAGNOSIS — L603 Nail dystrophy: Secondary | ICD-10-CM | POA: Diagnosis not present

## 2023-07-10 NOTE — Progress Notes (Signed)
Subjective:  Patient ID: Mark Shah, male    DOB: 12-22-1955,  MRN: [id number]  Chief Complaint  Patient presents with   Nail Problem    Split toenail     67 y.o. male presents with the above complaint.  Patient presents with left hallux nail dystrophy.  Patient said there was a lot of splitting of the nail.  Wanted get it evaluated he states that there may be a superficial nail trauma.  He denies seeing anyone else prior to seeing me denies any other acute complaints he does not want to have it get caught.  The nail is well adhered to the underlying nailbed.  Pain scale is 2 out of 10.   Review of Systems: Negative except as noted in the HPI. Denies N/V/F/Ch.  Past Medical History:  Diagnosis Date   Acquired hypothyroidism 08/28/2013   Adhesive capsulitis    in shoulders   Atherosclerosis of coronary artery 05/14/2018   Formatting of this note might be different from the original. Formatting of this note might be different from the original. Cath 05/13/18 LM normal, ostial LAD 25%, 95% prox LAD, 70% prox diag, 40% mid LAD, 30% prox circ, irregularities RCA  2.75 x 12 mm Synergy stent to prox LAD post dil to 2.9 and 2.25 x 12 mm Synergy stent post dial to 2.4 Dr. Herbie Baltimore   Atherosclerotic heart disease of native coronary artery without angina pectoris 02/06/2021   Atrial fibrillation with RVR (HCC) 01/03/2019   Atrial flutter, paroxysmal (HCC) 01/07/2019   CAD (coronary artery disease), native coronary artery 05/14/2018   Cath 05/13/18 LM normal, ostial LAD 25%, 95% prox LAD, 70% prox diag, 40% mid LAD, 30% prox circ, irregularities RCA  2.75 x 12 mm Synergy stent to prox LAD post dil to 2.9 and 2.25 x 12 mm Synergy stent post dial to 2.4 Dr. Herbie Baltimore    Chronic maxillary sinusitis 02/06/2021   Deviated septum 10/06/2019   Diabetes mellitus due to underlying condition with unspecified complications (HCC) 09/04/2021   Diabetes mellitus without complication (HCC)    Diverticulitis     Dyslipidemia    ED (erectile dysfunction)    ED (erectile dysfunction) of organic origin 02/06/2021   Encounter for immunization 07/03/2022   Essential hypertension 05/13/2018   Gastroesophageal reflux disease without esophagitis 10/06/2019   Globus pharyngeus 10/06/2019   Hearing loss of left ear 01/12/2020   HTN (hypertension)    Hypercholesteremia 01/03/2019   Hyperglycemia due to type 1 diabetes mellitus (HCC) 05/13/2018   Hyperlipidemia    Hyperlipidemia due to type 1 diabetes mellitus (HCC) 05/13/2018   Hyperlipidemia, unspecified    Hypothyroid 08/28/2013   Hypothyroidism    Insomnia 02/06/2021   Left lower quadrant pain 02/06/2021   Left-sided tinnitus 01/12/2020   Long term (current) use of insulin (HCC) 02/06/2021   Low back pain 02/06/2021   Mixed dyslipidemia 01/07/2019   Nasal turbinate hypertrophy 10/06/2019   Obstructive sleep apnea syndrome 02/06/2021   Other seborrheic keratosis 09/26/2021   Overweight 02/06/2021   PAF (paroxysmal atrial fibrillation) (HCC) 09/19/2021   Perioral dermatitis 09/26/2021   Peripheral neuropathy 02/06/2021   PONV (postoperative nausea and vomiting)    Pure hypercholesterolemia 02/06/2021   Reactive depression 02/06/2021   Rhinitis, chronic 10/06/2019   Seborrheic dermatitis 07/03/2022   Type 1 diabetes mellitus (HCC) 08/28/2013   Typical atrial flutter (HCC)    Unspecified abnormal finding in specimens from other organs, systems and tissues 02/06/2021    Current Outpatient Medications:  CONTOUR NEXT TEST test strip, 3 (three) times daily., Disp: , Rfl:    acetaminophen (TYLENOL) 500 MG tablet, Take 1,000 mg by mouth every 6 (six) hours as needed for moderate pain or headache., Disp: , Rfl:    apixaban (ELIQUIS) 5 MG TABS tablet, Take 1 tablet (5 mg total) by mouth 2 (two) times daily., Disp: 180 tablet, Rfl: 1   clopidogrel (PLAVIX) 75 MG tablet, Take 75 mg by mouth daily., Disp: , Rfl:    Cyanocobalamin (VITAMIN B-12 PO), Take 1 tablet by mouth at bedtime., Disp: ,  Rfl:    fluticasone (FLONASE) 50 MCG/ACT nasal spray, Place 1 spray into both nostrils daily as needed for allergies. , Disp: , Rfl: 1   Glucagon HCl 1 MG SOLR, Inject 1 mg into the skin once as needed (low blood sugar)., Disp: , Rfl:    levothyroxine (SYNTHROID) 137 MCG tablet, Take 137 mcg by mouth every morning., Disp: , Rfl:    loratadine (CLARITIN) 10 MG tablet, Take 10 mg by mouth daily as needed for allergies., Disp: , Rfl:    LYUMJEV 100 UNIT/ML SOLN, Inject 60 Units into the skin daily. Via insulin pump, Disp: , Rfl:    MAGNESIUM PO, Take 1 tablet by mouth at bedtime., Disp: , Rfl:    nitroGLYCERIN (NITROSTAT) 0.4 MG SL tablet, Place 1 tablet (0.4 mg total) under the tongue every 5 (five) minutes as needed for chest pain., Disp: 25 tablet, Rfl: 3   omega-3 acid ethyl esters (LOVAZA) 1 g capsule, Take 2 g by mouth at bedtime., Disp: , Rfl:    ranolazine (RANEXA) 1000 MG SR tablet, Take 1 tablet (1,000 mg total) by mouth 2 (two) times daily., Disp: 180 tablet, Rfl: 1   rosuvastatin (CRESTOR) 40 MG tablet, Take 1 tablet (40 mg total) by mouth daily., Disp: 90 tablet, Rfl: 3   tadalafil (CIALIS) 20 MG tablet, Take 20 mg by mouth daily as needed for erectile dysfunction., Disp: , Rfl:    traZODone (DESYREL) 100 MG tablet, Take 100 mg by mouth at bedtime., Disp: , Rfl:   Social History   Tobacco Use  Smoking Status Former   Passive exposure: Past  Smokeless Tobacco Never    Allergies  Allergen Reactions   Atorvastatin Other (See Comments)    Elevated LFTs     Erythromycin Other (See Comments)    GI Intolerance, GI Upset (intolerance)    Silicone Rash   Objective:  There were no vitals filed for this visit. There is no height or weight on file to calculate BMI. Constitutional Well developed. Well nourished.  Vascular Dorsalis pedis pulses palpable bilaterally. Posterior tibial pulses palpable bilaterally. Capillary refill normal to all digits.  No cyanosis or clubbing  noted. Pedal hair growth normal.  Neurologic Normal speech. Oriented to person, place, and time. Epicritic sensation to light touch grossly present bilaterally.  Dermatologic Left hallux nail dystrophy noted.  Pain on palpation.  Nail well adhered to the underlying nailbed.  No signs of infection noted  Orthopedic: Normal joint ROM without pain or crepitus bilaterally. No visible deformities. No bony tenderness.   Radiographs: None Assessment:   1. Nail dystrophy    Plan:  Patient was evaluated and treated and all questions answered.  Left hallux nail dystrophy -All questions and concerns were discussed with the patient in extensive detail. -I discussed with the patient that this is likely due to the underlying nail trauma.  The nail was debrided down using English nail on  nipper as a part of the courtesy.  The nail appears well adhered to the nail bed.  No signs of nail removal needed. -I discussed shoe gear modification.  Return if symptoms worsen or fail to improve, for Diabetic foot check and nail dystrophy.

## 2023-07-11 ENCOUNTER — Telehealth: Payer: Self-pay | Admitting: Cardiology

## 2023-07-11 DIAGNOSIS — I480 Paroxysmal atrial fibrillation: Secondary | ICD-10-CM

## 2023-07-11 MED ORDER — APIXABAN 5 MG PO TABS
5.0000 mg | ORAL_TABLET | Freq: Two times a day (BID) | ORAL | 1 refills | Status: DC
Start: 2023-07-11 — End: 2023-07-18

## 2023-07-11 NOTE — Telephone Encounter (Signed)
Prescription refill request for Eliquis received. Indication: Afib  Last office visit: 06/13/23 (Revankar)  Scr: 1.15 (06/17/23)  Age: 67 Weight: 87.5kg  Appropriate dose. Refill sent.

## 2023-07-11 NOTE — Telephone Encounter (Signed)
*  STAT* If patient is at the pharmacy, call can be transferred to refill team.   1. Which medications need to be refilled? (please list name of each medication and dose if known)   apixaban (ELIQUIS) 5 MG TABS tablet       4. Which pharmacy/location (including street and city if local pharmacy) is medication to be sent to? [facility] - [place], [place] - [address]     5. Do they need a 30 day or 90 day supply? 90

## 2023-07-18 ENCOUNTER — Encounter: Payer: Self-pay | Admitting: Cardiology

## 2023-07-18 ENCOUNTER — Other Ambulatory Visit: Payer: Self-pay | Admitting: Cardiology

## 2023-07-18 DIAGNOSIS — I2510 Atherosclerotic heart disease of native coronary artery without angina pectoris: Secondary | ICD-10-CM

## 2023-07-18 DIAGNOSIS — E782 Mixed hyperlipidemia: Secondary | ICD-10-CM

## 2023-07-18 DIAGNOSIS — I480 Paroxysmal atrial fibrillation: Secondary | ICD-10-CM

## 2023-07-18 MED ORDER — APIXABAN 5 MG PO TABS
5.0000 mg | ORAL_TABLET | Freq: Two times a day (BID) | ORAL | 1 refills | Status: DC
Start: 2023-07-18 — End: 2023-11-25

## 2023-07-18 MED ORDER — RANOLAZINE ER 1000 MG PO TB12: 1000.0000 mg | ORAL_TABLET | Freq: Two times a day (BID) | ORAL | 3 refills | Status: DC

## 2023-07-18 MED ORDER — CLOPIDOGREL BISULFATE 75 MG PO TABS: 75.0000 mg | ORAL_TABLET | Freq: Every day | ORAL | 3 refills | Status: DC

## 2023-07-18 MED ORDER — ROSUVASTATIN CALCIUM 40 MG PO TABS
40.0000 mg | ORAL_TABLET | Freq: Every day | ORAL | 3 refills | Status: DC
Start: 2023-07-18 — End: 2024-08-04

## 2023-09-17 ENCOUNTER — Other Ambulatory Visit: Payer: Self-pay | Admitting: Urology

## 2023-09-17 DIAGNOSIS — R972 Elevated prostate specific antigen [PSA]: Secondary | ICD-10-CM

## 2023-09-18 ENCOUNTER — Telehealth: Payer: Self-pay

## 2023-09-18 NOTE — Telephone Encounter (Signed)
Pharmacy please advise on holding Eliquis prior to Fusion Biopsy scheduled for TBD. Thank you.

## 2023-09-18 NOTE — Telephone Encounter (Signed)
   Pre-operative Risk Assessment    Patient Name: Mark Shah  DOB: December 10, 1955 MRN: [id number]      Request for Surgical Clearance    Procedure:   fusion biopsy  Date of Surgery:  Clearance TBD                                 Surgeon:  Dr. Liliane Shi Surgeon's Group or Practice Name:  [facility] Phone number:  [phone] Fax number:  [phone]   Type of Clearance Requested:   - Pharmacy:  Hold Clopidogrel (Plavix) and Apixaban (Eliquis) Plavix 5 days prior to procedure and Eliquis 3 days prior to   Type of Anesthesia:  Not Indicated   Additional requests/questions:    Merlene Laughter   09/18/2023, 3:05 PM

## 2023-09-19 NOTE — Telephone Encounter (Signed)
Patient with diagnosis of atrial fibrillation on apixaban (Eliquis) 5 mg PO BID for anticoagulation.    Procedure: fusion biopsy - prostate Date of procedure: TBD   CHA2DS2-VASc Score = 4   This indicates a 4.8% annual risk of stroke. The patient's score is based upon: CHF History: 0 HTN History: 1 Diabetes History: 1 Stroke History: 0 Vascular Disease History: 1 Age Score: 1 Gender Score: 0      CrCl 76.1 mL/min (ABW) Platelet count 191  Per office protocol, patient can hold Eliquis for 3 days prior to procedure.   Patient will not need bridging with Lovenox (enoxaparin) around procedure.  **This guidance is not considered finalized until pre-operative APP has relayed final recommendations.**  Nils Pyle, PharmD PGY1 Pharmacy Resident

## 2023-09-20 ENCOUNTER — Telehealth: Payer: Self-pay

## 2023-09-20 NOTE — Telephone Encounter (Signed)
  Patient Consent for Virtual Visit        Mark Shah has provided verbal consent on 09/20/2023 for a virtual visit (video or telephone).   CONSENT FOR VIRTUAL VISIT FOR:  Mark Shah  By participating in this virtual visit I agree to the following:  I hereby voluntarily request, consent and authorize [facility] and its employed or contracted physicians, physician assistants, nurse practitioners or other licensed health care professionals (the Practitioner), to provide me with telemedicine health care services (the "Services") as deemed necessary by the treating Practitioner. I acknowledge and consent to receive the Services by the Practitioner via telemedicine. I understand that the telemedicine visit will involve communicating with the Practitioner through live audiovisual communication technology and the disclosure of certain medical information by electronic transmission. I acknowledge that I have been given the opportunity to request an in-person assessment or other available alternative prior to the telemedicine visit and am voluntarily participating in the telemedicine visit.  I understand that I have the right to withhold or withdraw my consent to the use of telemedicine in the course of my care at any time, without affecting my right to future care or treatment, and that the Practitioner or I may terminate the telemedicine visit at any time. I understand that I have the right to inspect all information obtained and/or recorded in the course of the telemedicine visit and may receive copies of available information for a reasonable fee.  I understand that some of the potential risks of receiving the Services via telemedicine include:  Delay or interruption in medical evaluation due to technological equipment failure or disruption; Information transmitted may not be sufficient (e.g. poor resolution of images) to allow for appropriate medical decision making by the  Practitioner; and/or  In rare instances, security protocols could fail, causing a breach of personal health information.  Furthermore, I acknowledge that it is my responsibility to provide information about my medical history, conditions and care that is complete and accurate to the best of my ability. I acknowledge that Practitioner's advice, recommendations, and/or decision may be based on factors not within their control, such as incomplete or inaccurate data provided by me or distortions of diagnostic images or specimens that may result from electronic transmissions. I understand that the practice of medicine is not an exact science and that Practitioner makes no warranties or guarantees regarding treatment outcomes. I acknowledge that a copy of this consent can be made available to me via my patient portal [facility] MyChart), or I can request a printed copy by calling the office of [facility].    I understand that my insurance will be billed for this visit.   I have read or had this consent read to me. I understand the contents of this consent, which adequately explains the benefits and risks of the Services being provided via telemedicine.  I have been provided ample opportunity to ask questions regarding this consent and the Services and have had my questions answered to my satisfaction. I give my informed consent for the services to be provided through the use of telemedicine in my medical care

## 2023-09-20 NOTE — Telephone Encounter (Signed)
   Name: Mark Shah  DOB: [birth year]  MRN: [id number]  Primary Cardiologist: None   Preoperative team, please contact this patient and set up a phone call appointment for further preoperative risk assessment. Please obtain consent and complete medication review. Thank you for your help.  I confirm that guidance regarding antiplatelet and oral anticoagulation therapy has been completed and, if necessary, noted below.  Per office protocol, if patient is without any new symptoms or concerns at the time of their virtual visit, he may hold Plavix for 5 days prior to procedure. Please resume Plavix as soon as possible postprocedure, at the discretion of the surgeon.   Per office protocol, patient can hold Eliquis for 3 days prior to procedure.   Patient will not need bridging with Lovenox (enoxaparin) around procedure.  I also confirmed the patient resides in the state of [place]. As per [facility] Medical Board telemedicine laws, the patient must reside in the state in which the provider is licensed.   Joylene Grapes, NP 09/20/2023, 8:32 AM [facility]

## 2023-09-20 NOTE — Telephone Encounter (Signed)
Spoke with patient who is agreeable to do a tele visit on 12/4 at 10:20 am. Med rec and consent completed.

## 2023-10-09 ENCOUNTER — Ambulatory Visit: Payer: Medicare Other | Attending: Cardiovascular Disease

## 2023-10-09 DIAGNOSIS — Z01810 Encounter for preprocedural cardiovascular examination: Secondary | ICD-10-CM

## 2023-10-09 NOTE — Progress Notes (Signed)
Virtual Visit via Telephone Note   Because of Mark Shah co-morbid illnesses, he is at least at moderate risk for complications without adequate follow up.  This format is felt to be most appropriate for this patient at this time.  The patient did not have access to video technology/had technical difficulties with video requiring transitioning to audio format only (telephone).  All issues noted in this document were discussed and addressed.  No physical exam could be performed with this format.  Please refer to the patient's chart for his consent to telehealth for [facility].  Evaluation Performed:  Preoperative cardiovascular risk assessment _____________   Date:  10/09/2023   Patient ID:  Mark Shah, DOB 10/08/1956, MRN [id number] Patient Location:  Home Provider location:   Office  Primary Care Provider:  Darrow Bussing, MD Primary Cardiologist:  None  Chief Complaint / Patient Profile   67 y.o. y/o male with a h/o paroxysmal atrial fibrillation, coronary artery disease, hyperlipidemia, hypertension who is pending fusion biopsy and presents today for telephonic preoperative cardiovascular risk assessment.  History of Present Illness    Mark Shah is a 67 y.o. male who presents via audio/video conferencing for a telehealth visit today.  Pt was last seen in cardiology clinic on 06/13/2023 by Dr. Tomie China.  At that time Mark Shah was doing well .  He underwent nuclear stress testing 06/17/2023 which showed low risk and no ischemia.  The patient is now pending procedure as outlined above. Since his last visit, he remains stable from a cardiac standpoint.  Today he denies  shortness of breath, lower extremity edema, fatigue, melena, hematuria, hemoptysis, diaphoresis, weakness, presyncope, syncope, orthopnea, and PND.   Past Medical History    Past Medical History:  Diagnosis Date   Acquired hypothyroidism 08/28/2013   Adhesive capsulitis    in  shoulders   Atherosclerosis of coronary artery 05/14/2018   Formatting of this note might be different from the original. Formatting of this note might be different from the original. Cath 05/13/18 LM normal, ostial LAD 25%, 95% prox LAD, 70% prox diag, 40% mid LAD, 30% prox circ, irregularities RCA  2.75 x 12 mm Synergy stent to prox LAD post dil to 2.9 and 2.25 x 12 mm Synergy stent post dial to 2.4 Dr. Herbie Baltimore   Atherosclerotic heart disease of native coronary artery without angina pectoris 02/06/2021   Atrial fibrillation with RVR (HCC) 01/03/2019   Atrial flutter, paroxysmal (HCC) 01/07/2019   CAD (coronary artery disease), native coronary artery 05/14/2018   Cath 05/13/18 LM normal, ostial LAD 25%, 95% prox LAD, 70% prox diag, 40% mid LAD, 30% prox circ, irregularities RCA  2.75 x 12 mm Synergy stent to prox LAD post dil to 2.9 and 2.25 x 12 mm Synergy stent post dial to 2.4 Dr. Herbie Baltimore    Chronic maxillary sinusitis 02/06/2021   Deviated septum 10/06/2019   Diabetes mellitus due to underlying condition with unspecified complications (HCC) 09/04/2021   Diabetes mellitus without complication (HCC)    Diverticulitis    Dyslipidemia    ED (erectile dysfunction)    ED (erectile dysfunction) of organic origin 02/06/2021   Encounter for immunization 07/03/2022   Essential hypertension 05/13/2018   Gastroesophageal reflux disease without esophagitis 10/06/2019   Globus pharyngeus 10/06/2019   Hearing loss of left ear 01/12/2020   HTN (hypertension)    Hypercholesteremia 01/03/2019   Hyperglycemia due to type 1 diabetes mellitus (HCC) 05/13/2018   Hyperlipidemia    Hyperlipidemia due to type  1 diabetes mellitus (HCC) 05/13/2018   Hyperlipidemia, unspecified    Hypothyroid 08/28/2013   Hypothyroidism    Insomnia 02/06/2021   Left lower quadrant pain 02/06/2021   Left-sided tinnitus 01/12/2020   Long term (current) use of insulin (HCC) 02/06/2021   Low back pain 02/06/2021   Mixed dyslipidemia 01/07/2019   Nasal turbinate  hypertrophy 10/06/2019   Obstructive sleep apnea syndrome 02/06/2021   Other seborrheic keratosis 09/26/2021   Overweight 02/06/2021   PAF (paroxysmal atrial fibrillation) (HCC) 09/19/2021   Perioral dermatitis 09/26/2021   Peripheral neuropathy 02/06/2021   PONV (postoperative nausea and vomiting)    Pure hypercholesterolemia 02/06/2021   Reactive depression 02/06/2021   Rhinitis, chronic 10/06/2019   Seborrheic dermatitis 07/03/2022   Type 1 diabetes mellitus (HCC) 08/28/2013   Typical atrial flutter (HCC)    Unspecified abnormal finding in specimens from other organs, systems and tissues 02/06/2021   Past Surgical History:  Procedure Laterality Date   A-FLUTTER ABLATION N/A 08/28/2019   Procedure: A-FLUTTER ABLATION;  Surgeon: Regan Lemming, MD;  Location: [facility] INVASIVE CV LAB;  Service: Cardiovascular;  Laterality: N/A;   BACK SURGERY     CERVICAL DISC SURGERY     CORONARY STENT INTERVENTION N/A 05/13/2018   Procedure: CORONARY STENT INTERVENTION;  Surgeon: Marykay Lex, MD;  Location: [facility] INVASIVE CV LAB;  Service: Cardiovascular;  Laterality: N/A;   left hand     LEFT HEART CATH AND CORONARY ANGIOGRAPHY N/A 05/13/2018   Procedure: LEFT HEART CATH AND CORONARY ANGIOGRAPHY;  Surgeon: Marykay Lex, MD;  Location: [facility] INVASIVE CV LAB;  Service: Cardiovascular;  Laterality: N/A;    Allergies  Allergies  Allergen Reactions   Atorvastatin Other (See Comments)    Elevated LFTs     Erythromycin Other (See Comments)    GI Intolerance, GI Upset (intolerance)    Silicone Rash    Home Medications    Prior to Admission medications   Medication Sig Start Date End Date Taking? Authorizing Provider  acetaminophen (TYLENOL) 500 MG tablet Take 1,000 mg by mouth every 6 (six) hours as needed for moderate pain or headache.    [provider]  apixaban (ELIQUIS) 5 MG TABS tablet Take 1 tablet (5 mg total) by mouth 2 (two) times daily. 07/18/23   Revankar, Aundra Dubin, MD  clopidogrel  (PLAVIX) 75 MG tablet Take 1 tablet (75 mg total) by mouth daily. 07/18/23   Revankar, Aundra Dubin, MD  CONTOUR NEXT TEST test strip 3 (three) times daily. 06/17/23   [provider]  Cyanocobalamin (VITAMIN B-12 PO) Take 1 tablet by mouth at bedtime.    [provider]  fluticasone (FLONASE) 50 MCG/ACT nasal spray Place 1 spray into both nostrils daily as needed for allergies.  12/28/15   [provider]  Glucagon HCl 1 MG SOLR Inject 1 mg into the skin once as needed (low blood sugar).    [provider]  levothyroxine (SYNTHROID) 137 MCG tablet Take 137 mcg by mouth every morning. 11/21/21   [provider]  loratadine (CLARITIN) 10 MG tablet Take 10 mg by mouth daily as needed for allergies.    [provider]  LYUMJEV 100 UNIT/ML SOLN Inject 60 Units into the skin daily. Via insulin pump 01/26/21   [provider]  MAGNESIUM PO Take 1 tablet by mouth at bedtime.    [provider]  nitroGLYCERIN (NITROSTAT) 0.4 MG SL tablet Place 1 tablet (0.4 mg total) under the tongue every 5 (five)  minutes as needed for chest pain. 09/04/21   Revankar, Aundra Dubin, MD  omega-3 acid ethyl esters (LOVAZA) 1 g capsule Take 2 g by mouth at bedtime.    [provider]  ranolazine (RANEXA) 1000 MG SR tablet Take 1 tablet (1,000 mg total) by mouth 2 (two) times daily. 07/18/23   Revankar, Aundra Dubin, MD  rosuvastatin (CRESTOR) 40 MG tablet Take 1 tablet (40 mg total) by mouth daily. 07/18/23   Revankar, Aundra Dubin, MD  tadalafil (CIALIS) 20 MG tablet Take 20 mg by mouth daily as needed for erectile dysfunction. 04/26/22   [provider]  traZODone (DESYREL) 100 MG tablet Take 100 mg by mouth at bedtime. 04/26/22   [provider]    Physical Exam    Vital Signs:  Stonewall Cattani does not have vital signs available for review today.  Given telephonic nature of communication, physical exam is limited. AAOx3. NAD. Normal affect.  Speech  and respirations are unlabored.  Accessory Clinical Findings    None  Assessment & Plan    1.  Preoperative Cardiovascular Risk Assessment:fusion biopsy, Dr. [facility] Phone number:  [phone] Fax number:  [phone]      Primary Cardiologist: Dr. Tomie China  Chart reviewed as part of pre-operative protocol coverage. Given past medical history and time since last visit, based on ACC/AHA guidelines, Ladarien Braam would be at acceptable risk for the planned procedure without further cardiovascular testing.   Patient was advised that if he develops new symptoms prior to surgery to contact our office to arrange a follow-up appointment.  He verbalized understanding.  His Plavix may be held for 5 days prior to his procedure.  Please resume as soon as hemostasis is achieved.  Per protocol his Eliquis may be held for 3 days prior to his procedure.  Please resume as soon as hemostasis is achieved.  He will not need Lovenox bridging around procedure.    I will route this recommendation to the requesting party via Epic fax function and remove from pre-op pool.  Please call with questions.       Time:   Today, I have spent  7 minutes with the patient with telehealth technology discussing medical history, symptoms, and management plan.     Ronney Asters, NP  10/09/2023, 7:40 AM     Prior to patient's phone evaluation I spent greater than 10 minutes reviewing their past medical history and cardiac medications.

## 2023-10-16 ENCOUNTER — Telehealth: Payer: Self-pay | Admitting: Cardiology

## 2023-10-16 NOTE — Telephone Encounter (Signed)
Re-faxed over clearance note from Edd Fabian, NP, 10/09/23, to Fax# [phone]

## 2023-10-16 NOTE — Telephone Encounter (Signed)
See previous clearance encounter.  Patty with [facility] is following up stating their office never received clearance recommendation for prostate biopsy with Dr. Liliane Shi. Please provide updates.  Phone#: [phone] (ext#: [phone]) Fax#: [phone]

## 2023-10-19 ENCOUNTER — Encounter: Payer: Self-pay | Admitting: Cardiology

## 2023-10-19 DIAGNOSIS — I2510 Atherosclerotic heart disease of native coronary artery without angina pectoris: Secondary | ICD-10-CM

## 2023-10-21 MED ORDER — NITROGLYCERIN 0.4 MG SL SUBL
0.4000 mg | SUBLINGUAL_TABLET | SUBLINGUAL | 6 refills | Status: AC | PRN
Start: 2023-10-21 — End: ?

## 2023-11-02 ENCOUNTER — Ambulatory Visit
Admission: RE | Admit: 2023-11-02 | Discharge: 2023-11-02 | Disposition: A | Discharge status: Home / Self Care | Payer: Medicare Other | Source: Ambulatory Visit | Attending: Urology | Admitting: Urology

## 2023-11-02 DIAGNOSIS — R972 Elevated prostate specific antigen [PSA]: Secondary | ICD-10-CM

## 2023-11-02 MED ORDER — GADOPICLENOL 0.5 MMOL/ML IV SOLN
8.0000 mL | Freq: Once | INTRAVENOUS | Status: AC | PRN
  Administered 2023-11-02: 8 mL via INTRAVENOUS

## 2023-11-24 ENCOUNTER — Encounter: Payer: Self-pay | Admitting: Cardiology

## 2023-11-25 ENCOUNTER — Other Ambulatory Visit: Payer: Self-pay | Admitting: *Deleted

## 2023-11-25 DIAGNOSIS — I480 Paroxysmal atrial fibrillation: Secondary | ICD-10-CM

## 2023-11-25 MED ORDER — APIXABAN 5 MG PO TABS
5.0000 mg | ORAL_TABLET | Freq: Two times a day (BID) | ORAL | 1 refills | Status: DC
Start: 2023-11-25 — End: 2024-04-15

## 2023-11-25 NOTE — Telephone Encounter (Signed)
Prescription refill request for Eliquis received. Indication: PAF Last office visit: 06/13/23  R Revankar MD Scr: 1.15 on 06/17/23  Epic Age: 68 Weight: 87.6kg  Based on above findings Eliquis 5mg  twice daily is the appropriate dose.  Refill approved.

## 2023-12-17 ENCOUNTER — Telehealth: Payer: Self-pay | Admitting: Radiation Oncology

## 2023-12-17 NOTE — Telephone Encounter (Signed)
Called patient to schedule a consultation w. Dr. Kathrynn Running. Patient requested appointment delay due to being out of town.

## 2023-12-26 ENCOUNTER — Encounter: Payer: Self-pay | Admitting: Radiation Oncology

## 2023-12-26 NOTE — Progress Notes (Signed)
 GU Location of Tumor / Histology: Prostate Ca  If Prostate Cancer, Gleason Score is (3 + 4) and PSA is (4.52 on 07/2023)  Mark Shah presented as referral from Dr. Rhoderick Moody [facility]) for elevated PSA.  Biopsies      11/02/2023 Dr. Dr. Rhoderick Moody MR Prostate with/without Contrast CLINICAL DATA: Elevated PSA level of 4.52 on 07/26/2023   IMPRESSION: 1. Small PI-RADS category 4 lesion in the left posterolateral peripheral zone in the mid gland and apex. Targeting data sent to [facility]. 2. Sigmoid colon diverticulosis.    Past/Anticipated interventions by urology, if any: NA  Past/Anticipated interventions by medical oncology, if any:  NA  Weight changes, if any:  No  IPSS:  15 SHIM:  11  Bowel/Bladder complaints, if any:  No  Nausea/Vomiting, if any:  No  Pain issues, if any:  Mild discomfort from prostate biopsy 2/10. Trigger finger. 2/10 received steroid shot helps.  SAFETY ISSUES: Prior radiation? No Pacemaker/ICD?  No Possible current pregnancy? Male Is the patient on methotrexate? No  Current Complaints / other details:

## 2024-01-02 ENCOUNTER — Ambulatory Visit
Admission: RE | Admit: 2024-01-02 | Discharge: 2024-01-02 | Disposition: A | Discharge status: Home / Self Care | Payer: Medicare Other | Source: Ambulatory Visit | Attending: Radiation Oncology | Admitting: Radiation Oncology

## 2024-01-02 ENCOUNTER — Encounter: Payer: Self-pay | Admitting: Radiation Oncology

## 2024-01-02 VITALS — BP 135/82 | HR 74 | Temp 98.5°F | Resp 18 | Ht 70.0 in | Wt 187.6 lb

## 2024-01-02 DIAGNOSIS — Z794 Long term (current) use of insulin: Secondary | ICD-10-CM | POA: Insufficient documentation

## 2024-01-02 DIAGNOSIS — Z801 Family history of malignant neoplasm of trachea, bronchus and lung: Secondary | ICD-10-CM | POA: Diagnosis not present

## 2024-01-02 DIAGNOSIS — E039 Hypothyroidism, unspecified: Secondary | ICD-10-CM | POA: Insufficient documentation

## 2024-01-02 DIAGNOSIS — Z7902 Long term (current) use of antithrombotics/antiplatelets: Secondary | ICD-10-CM | POA: Insufficient documentation

## 2024-01-02 DIAGNOSIS — E1042 Type 1 diabetes mellitus with diabetic polyneuropathy: Secondary | ICD-10-CM | POA: Diagnosis not present

## 2024-01-02 DIAGNOSIS — E785 Hyperlipidemia, unspecified: Secondary | ICD-10-CM | POA: Insufficient documentation

## 2024-01-02 DIAGNOSIS — Z79890 Hormone replacement therapy (postmenopausal): Secondary | ICD-10-CM | POA: Diagnosis not present

## 2024-01-02 DIAGNOSIS — I2510 Atherosclerotic heart disease of native coronary artery without angina pectoris: Secondary | ICD-10-CM | POA: Diagnosis not present

## 2024-01-02 DIAGNOSIS — Z7901 Long term (current) use of anticoagulants: Secondary | ICD-10-CM | POA: Insufficient documentation

## 2024-01-02 DIAGNOSIS — I10 Essential (primary) hypertension: Secondary | ICD-10-CM | POA: Diagnosis not present

## 2024-01-02 DIAGNOSIS — G4730 Sleep apnea, unspecified: Secondary | ICD-10-CM | POA: Diagnosis not present

## 2024-01-02 DIAGNOSIS — Z79899 Other long term (current) drug therapy: Secondary | ICD-10-CM | POA: Insufficient documentation

## 2024-01-02 DIAGNOSIS — J320 Chronic maxillary sinusitis: Secondary | ICD-10-CM | POA: Diagnosis not present

## 2024-01-02 DIAGNOSIS — G4700 Insomnia, unspecified: Secondary | ICD-10-CM | POA: Diagnosis not present

## 2024-01-02 DIAGNOSIS — C61 Malignant neoplasm of prostate: Secondary | ICD-10-CM | POA: Insufficient documentation

## 2024-01-02 HISTORY — DX: Malignant neoplasm of prostate: C61

## 2024-01-02 NOTE — Progress Notes (Signed)
 Radiation Oncology         [phone] ________________________________  Initial Outpatient Consultation  Name: Yarden Hillis MRN: [id number]  Date: 01/02/2024  DOB: 01/12/1956  WU:[id number], Dibas, MD  Shelly Rubenstein*   REFERRING PHYSICIAN: Shelly Rubenstein*  DIAGNOSIS: 68 y.o. gentleman with Stage T1c adenocarcinoma of the prostate with Gleason score of 3+4, and PSA of 4.52.    ICD-10-CM   1. Malignant neoplasm of prostate (HCC)  C61       HISTORY OF PRESENT ILLNESS: Vaibhav Fogleman is a 68 y.o. male with a diagnosis of prostate cancer. He was noted to have an elevated PSA of 4.52 by his primary care physician, Dr. Docia Chuck.  His PSA has been rising for the past several years at 1.88 in 2020, 2.3 in 2023, 3.89 in 03/2023 and up to 4.52 in 07/2023. Accordingly, he was referred for evaluation in urology by Dr. Liliane Shi on 09/13/23. He reported moderate LUTS that are not bothersome enough to warrant taking medication and he does have a family history of prostate cancer in his paternal grand-father.  A prostate MRI was performed on 11/02/23 and showed a PIRADS 4 lesion in the left posterolateral peripheral zone in the mid gland and apex. Therefore, the patient proceeded to MRI fusion transrectal ultrasound with 16 biopsies of the prostate on 12/06/23.  The prostate volume measured 33 cc. Out of 16 core biopsies, 7 were positive.  The maximum Gleason score was 3+4, and this was seen in all 4 samples from the MRI ROI as well as the left mid and left mid lateral. Additionally, Gleason 3+3 was seen in the left apex lateral.  Decipher genomic testing was sent on 12/16/23 but results remain pending.  The patient reviewed the biopsy results with his urologist and he has kindly been referred today for discussion of potential radiation treatment options. He is also scheduled for a consult with Dr. Laverle Patter 01/21/24 to discuss his surgical treatment options.   PREVIOUS RADIATION THERAPY:  No  PAST MEDICAL HISTORY:  Past Medical History:  Diagnosis Date   Acquired hypothyroidism 08/28/2013   Adhesive capsulitis    in shoulders   Atherosclerosis of coronary artery 05/14/2018   Formatting of this note might be different from the original. Formatting of this note might be different from the original. Cath 05/13/18 LM normal, ostial LAD 25%, 95% prox LAD, 70% prox diag, 40% mid LAD, 30% prox circ, irregularities RCA  2.75 x 12 mm Synergy stent to prox LAD post dil to 2.9 and 2.25 x 12 mm Synergy stent post dial to 2.4 Dr. Herbie Baltimore   Atherosclerotic heart disease of native coronary artery without angina pectoris 02/06/2021   Atrial fibrillation with RVR (HCC) 01/03/2019   Atrial flutter, paroxysmal (HCC) 01/07/2019   CAD (coronary artery disease), native coronary artery 05/14/2018   Cath 05/13/18 LM normal, ostial LAD 25%, 95% prox LAD, 70% prox diag, 40% mid LAD, 30% prox circ, irregularities RCA  2.75 x 12 mm Synergy stent to prox LAD post dil to 2.9 and 2.25 x 12 mm Synergy stent post dial to 2.4 Dr. Herbie Baltimore    Chronic maxillary sinusitis 02/06/2021   Deviated septum 10/06/2019   Diabetes mellitus due to underlying condition with unspecified complications (HCC) 09/04/2021   Diabetes mellitus without complication (HCC)    Diverticulitis    Dyslipidemia    ED (erectile dysfunction)    ED (erectile dysfunction) of organic origin 02/06/2021   Encounter for immunization 07/03/2022   Essential hypertension 05/13/2018  Gastroesophageal reflux disease without esophagitis 10/06/2019   Globus pharyngeus 10/06/2019   Hearing loss of left ear 01/12/2020   HTN (hypertension)    Hypercholesteremia 01/03/2019   Hyperglycemia due to type 1 diabetes mellitus (HCC) 05/13/2018   Hyperlipidemia    Hyperlipidemia due to type 1 diabetes mellitus (HCC) 05/13/2018   Hyperlipidemia, unspecified    Hypothyroid 08/28/2013   Hypothyroidism    Insomnia 02/06/2021   Left lower quadrant pain 02/06/2021   Left-sided tinnitus  01/12/2020   Long term (current) use of insulin (HCC) 02/06/2021   Low back pain 02/06/2021   Mixed dyslipidemia 01/07/2019   Nasal turbinate hypertrophy 10/06/2019   Obstructive sleep apnea syndrome 02/06/2021   Other seborrheic keratosis 09/26/2021   Overweight 02/06/2021   PAF (paroxysmal atrial fibrillation) (HCC) 09/19/2021   Perioral dermatitis 09/26/2021   Peripheral neuropathy 02/06/2021   PONV (postoperative nausea and vomiting)    Pure hypercholesterolemia 02/06/2021   Reactive depression 02/06/2021   Rhinitis, chronic 10/06/2019   Seborrheic dermatitis 07/03/2022   Type 1 diabetes mellitus (HCC) 08/28/2013   Typical atrial flutter (HCC)    Unspecified abnormal finding in specimens from other organs, systems and tissues 02/06/2021      PAST SURGICAL HISTORY: Past Surgical History:  Procedure Laterality Date   A-FLUTTER ABLATION N/A 08/28/2019   Procedure: A-FLUTTER ABLATION;  Surgeon: Regan Lemming, MD;  Location: [facility] INVASIVE CV LAB;  Service: Cardiovascular;  Laterality: N/A;   BACK SURGERY     CERVICAL DISC SURGERY     CORONARY STENT INTERVENTION N/A 05/13/2018   Procedure: CORONARY STENT INTERVENTION;  Surgeon: Marykay Lex, MD;  Location: [facility] INVASIVE CV LAB;  Service: Cardiovascular;  Laterality: N/A;   left hand     LEFT HEART CATH AND CORONARY ANGIOGRAPHY N/A 05/13/2018   Procedure: LEFT HEART CATH AND CORONARY ANGIOGRAPHY;  Surgeon: Marykay Lex, MD;  Location: [facility] INVASIVE CV LAB;  Service: Cardiovascular;  Laterality: N/A;   PROSTATE BIOPSY      FAMILY HISTORY:  Family History  Problem Relation Age of Onset   Hyperlipidemia Mother    Asthma Mother    Asthma Father    COPD Father    Heart Problems Father        bicuspid aortic valve replacement   Other Brother        orthopedic problems   Lung cancer Maternal Grandfather    Heart Problems Paternal Grandmother        aortic v alve replacement   Hyperlipidemia Paternal Grandfather    Stroke Paternal  Grandfather    CVA Paternal Grandfather     SOCIAL HISTORY:  Social History   Socioeconomic History   Marital status: Married    Spouse name: Not on file   Number of children: Not on file   Years of education: Not on file   Highest education level: Not on file  Occupational History   Not on file  Tobacco Use   Smoking status: Former    Passive exposure: Past   Smokeless tobacco: Never  Vaping Use   Vaping status: Never Used  Substance and Sexual Activity   Alcohol use: Yes   Drug use: No   Sexual activity: Yes  Other Topics Concern   Not on file  Social History Narrative   Not on file   Social Drivers of Health   Financial Resource Strain: Not on file  Food Insecurity: Low Risk  (12/17/2023)   Received from [facility]   Hunger Vital  Sign    Worried About Programme researcher, broadcasting/film/video in the Last Year: Never true    Ran Out of Food in the Last Year: Never true  Transportation Needs: No Transportation Needs (12/17/2023)   Received from [employer]    In the past 12 months, has lack of reliable transportation kept you from medical appointments, meetings, work or from getting things needed for daily living? : No  Physical Activity: Not on file  Stress: Not on file  Social Connections: Not on file  Intimate Partner Violence: Not on file    ALLERGIES: Atorvastatin, Erythromycin, and Silicone  MEDICATIONS:  Current Outpatient Medications  Medication Sig Dispense Refill   acetaminophen (TYLENOL) 500 MG tablet Take 1,000 mg by mouth every 6 (six) hours as needed for moderate pain or headache.     apixaban (ELIQUIS) 5 MG TABS tablet Take 1 tablet (5 mg total) by mouth 2 (two) times daily. 180 tablet 1   clopidogrel (PLAVIX) 75 MG tablet Take 1 tablet (75 mg total) by mouth daily. 90 tablet 3   CONTOUR NEXT TEST test strip 3 (three) times daily.     Cyanocobalamin (VITAMIN B-12 PO) Take 1 tablet by mouth at bedtime.     fluticasone (FLONASE) 50 MCG/ACT nasal  spray Place 1 spray into both nostrils daily as needed for allergies.   1   Glucagon HCl 1 MG SOLR Inject 1 mg into the skin once as needed (low blood sugar).     levothyroxine (SYNTHROID) 137 MCG tablet Take 137 mcg by mouth every morning.     loratadine (CLARITIN) 10 MG tablet Take 10 mg by mouth daily as needed for allergies.     LYUMJEV 100 UNIT/ML SOLN Inject 60 Units into the skin daily. Via insulin pump     MAGNESIUM PO Take 1 tablet by mouth at bedtime.     nitroGLYCERIN (NITROSTAT) 0.4 MG SL tablet Place 1 tablet (0.4 mg total) under the tongue every 5 (five) minutes as needed for chest pain. 25 tablet 6   omega-3 acid ethyl esters (LOVAZA) 1 g capsule Take 2 g by mouth at bedtime.     ranolazine (RANEXA) 1000 MG SR tablet Take 1 tablet (1,000 mg total) by mouth 2 (two) times daily. 180 tablet 3   rosuvastatin (CRESTOR) 40 MG tablet Take 1 tablet (40 mg total) by mouth daily. 90 tablet 3   tadalafil (CIALIS) 20 MG tablet Take 20 mg by mouth daily as needed for erectile dysfunction.     traZODone (DESYREL) 100 MG tablet Take 100 mg by mouth at bedtime.     No current facility-administered medications for this visit.    REVIEW OF SYSTEMS:  On review of systems, the patient reports that he is doing well overall. He denies any chest pain, shortness of breath, cough, fevers, chills, night sweats, unintended weight changes. He denies any bowel disturbances, and denies abdominal pain, nausea or vomiting. He denies any new musculoskeletal or joint aches or pains. His IPSS was 15, indicating moderate urinary symptoms with increased urgency, frequency, intermittency and weak stream. He denies straining to void or feelings of incomplete emptying and reports that his LUTS are not bothersome enough to warrant taking medications. His SHIM was 11, indicating he has moderate erectile dysfunction. A complete review of systems is obtained and is otherwise negative.    PHYSICAL EXAM:  Wt Readings from Last  3 Encounters:  06/17/23 193 lb (87.5 kg)  06/13/23 193 lb 1.9  oz (87.6 kg)  07/04/22 171 lb 1.9 oz (77.6 kg)   Temp Readings from Last 3 Encounters:  06/01/23 97.8 F (36.6 C) (Oral)  09/09/21 99.1 F (37.3 C) (Oral)  07/07/21 98.7 F (37.1 C) (Oral)   BP Readings from Last 3 Encounters:  06/13/23 128/70  06/01/23 124/72  07/04/22 (!) 102/58   Pulse Readings from Last 3 Encounters:  06/13/23 72  06/01/23 76  07/04/22 76    /10  In general this is a well appearing Caucasian male in no acute distress. He's alert and oriented x4 and appropriate throughout the examination. Cardiopulmonary assessment is negative for acute distress, and he exhibits normal effort.     KPS = 100  100 - Normal; no complaints; no evidence of disease. 90   - Able to carry on normal activity; minor signs or symptoms of disease. 80   - Normal activity with effort; some signs or symptoms of disease. 39   - Cares for self; unable to carry on normal activity or to do active work. 60   - Requires occasional assistance, but is able to care for most of his personal needs. 50   - Requires considerable assistance and frequent medical care. 40   - Disabled; requires special care and assistance. 30   - Severely disabled; hospital admission is indicated although death not imminent. 20   - Very sick; hospital admission necessary; active supportive treatment necessary. 10   - Moribund; fatal processes progressing rapidly. 0     - Dead  Karnofsky DA, Abelmann WH, Craver LS and Burchenal [facility] [phone]) The use of the nitrogen mustards in the palliative treatment of carcinoma: with particular reference to bronchogenic carcinoma Cancer 1 634-56  LABORATORY DATA:  Lab Results  Component Value Date   WBC 5.2 05/31/2023   HGB 12.4 (L) 05/31/2023   HCT 36.1 (L) 05/31/2023   MCV 97.3 05/31/2023   PLT 191 05/31/2023   Lab Results  Component Value Date   NA 131 (L) 06/17/2023   K 4.3 06/17/2023   CL 95 (L) 06/17/2023    CO2 23 06/17/2023   Lab Results  Component Value Date   ALT 18 06/17/2023   AST 16 06/17/2023   ALKPHOS 49 06/17/2023   BILITOT 0.8 06/17/2023     RADIOGRAPHY: No results found.    IMPRESSION/PLAN: 1. 68 y.o. gentleman with Stage T1c adenocarcinoma of the prostate with Gleason Score of 3+4, and PSA of 4.52. We discussed the patient's workup and outlined the nature of prostate cancer in this setting. The patient's T stage, Gleason's score, and PSA put him into the favorable intermediate risk group. Accordingly, he is eligible for a variety of potential treatment options including active surveillance, brachytherapy, 5.5 weeks of external radiation, or prostatectomy. We discussed the available radiation techniques, and focused on the details and logistics of delivery. We discussed and outlined the risks, benefits, short and long-term effects associated with radiotherapy and compared and contrasted these with prostatectomy. We discussed the role of SpaceOAR gel in reducing the rectal toxicity associated with radiotherapy. He appears to have a good understanding of his disease and our treatment recommendations which are of curative intent.  He was encouraged to ask questions that were answered to his stated satisfaction.  At the conclusion of our conversation, the patient is undecided regarding his treatment preference and is interested in learning about his surgical options prior to committing to any specific treatment. He plans to keep his scheduled consult visit with Dr.  Borden on 01/21/24 and anticipates making a decision shortly thereafter so we will share our discussion with Dr. Liliane Shi. He has our contact information to let us know once he reaches a decision should he elect to proceed with brachytherapy or daily external beam radiation. We enjoyed meeting him today and look forward to continuing to participate in his care. He knows that he is welcome to call at any time in the interim with any  questions or concerns regarding our discussion today.  We personally spent 70 minutes in this encounter including chart review, reviewing radiological studies, meeting face-to-face with the patient, entering orders and completing documentation.    Marguarite Arbour, PA-C    Margaretmary Dys, MD  [facility]  Radiation Oncology Direct Dial: [phone]  Fax: [phone] [email or web address]  Skype  LinkedIn

## 2024-01-02 NOTE — Progress Notes (Signed)
 Introduced myself to the patient as the prostate nurse navigator.  No barriers to care identified at this time.  He is here to discuss his radiation treatment options, and will have a surgical consult with Dr. Laverle Patter on 3/18.  I gave him my business card and asked him to call me with questions or concerns.  Verbalized understanding.

## 2024-01-03 ENCOUNTER — Telehealth (INDEPENDENT_AMBULATORY_CARE_PROVIDER_SITE_OTHER): Payer: Self-pay | Admitting: Otolaryngology

## 2024-01-03 NOTE — Telephone Encounter (Signed)
 Confirmed appt & location [phone] afm

## 2024-01-06 ENCOUNTER — Ambulatory Visit (INDEPENDENT_AMBULATORY_CARE_PROVIDER_SITE_OTHER): Payer: Medicare Other

## 2024-01-06 ENCOUNTER — Encounter (INDEPENDENT_AMBULATORY_CARE_PROVIDER_SITE_OTHER): Payer: Self-pay

## 2024-01-06 VITALS — BP 150/82 | HR 70 | Ht 70.0 in | Wt 181.0 lb

## 2024-01-06 DIAGNOSIS — J342 Deviated nasal septum: Secondary | ICD-10-CM | POA: Diagnosis not present

## 2024-01-06 DIAGNOSIS — R0981 Nasal congestion: Secondary | ICD-10-CM | POA: Diagnosis not present

## 2024-01-06 DIAGNOSIS — J343 Hypertrophy of nasal turbinates: Secondary | ICD-10-CM | POA: Diagnosis not present

## 2024-01-06 DIAGNOSIS — J310 Chronic rhinitis: Secondary | ICD-10-CM | POA: Diagnosis not present

## 2024-01-06 DIAGNOSIS — R0982 Postnasal drip: Secondary | ICD-10-CM

## 2024-01-06 MED ORDER — IPRATROPIUM BROMIDE 0.06 % NA SOLN: 2.0000 | Freq: Two times a day (BID) | NASAL | 12 refills | Status: AC | PRN

## 2024-01-08 DIAGNOSIS — R0982 Postnasal drip: Secondary | ICD-10-CM | POA: Insufficient documentation

## 2024-01-08 HISTORY — DX: Postnasal drip: R09.82

## 2024-01-08 NOTE — Progress Notes (Signed)
 Patient ID: Mark Shah, male   DOB: 1956-07-03, 68 y.o.   MRN: [id number]  Follow-up: Chronic nasal congestion, postnasal drainage  HPI: The patient is a 68 year old male who returns today for his follow-up evaluation.  The patient was last seen 6 months ago.  At that time, he was experiencing chronic nasal congestion and chronic postnasal drainage.  He was treated with Flonase and Atrovent nasal sprays.  He subsequently underwent an allergy evaluation.  He was noted to be allergic to dust mite.  The patient returns today reporting improvement in his nasal congestion.  His postnasal drainage is currently under controlled with the use of Atrovent nasal spray.  He denies any facial pain, fever, or visual change.  Exam: General: Communicates without difficulty, well nourished, no acute distress. Head: Normocephalic, no evidence injury, no tenderness, facial buttresses intact without stepoff. Face/sinus: No tenderness to palpation and percussion. Facial movement is normal and symmetric. Eyes: PERRL, EOMI. No scleral icterus, conjunctivae clear. Neuro: CN II exam reveals vision grossly intact.  No nystagmus at any point of gaze. Ears: Auricles well formed without lesions.  Ear canals are intact without mass or lesion.  No erythema or edema is appreciated.  The TMs are intact without fluid. Nose: External evaluation reveals normal support and skin without lesions.  Dorsum is intact.  Anterior rhinoscopy reveals congested mucosa over anterior aspect of inferior turbinates and deviated septum.  No purulence noted. Oral:  Oral cavity and oropharynx are intact, symmetric, without erythema or edema.  Mucosa is moist without lesions. Neck: Full range of motion without pain.  There is no significant lymphadenopathy.  No masses palpable.  Thyroid bed within normal limits to palpation.  Parotid glands and submandibular glands equal bilaterally without mass.  Trachea is midline. Neuro:  CN 2-12 grossly intact.    Assessment: 1.  Chronic rhinitis with nasal mucosal congestion, nasal septal deviation, and bilateral inferior turbinate hypertrophy.  The severity of his nasal congestion has decreased. 2.  His chronic postnasal drainage is currently under control with the use of Atrovent nasal spray. 3.  His allergy testing showed dust mite allergies.  Plan: 1.  The physical exam findings are reviewed with the patient. 2.  Continue the use of Flonase/Atrovent nasal sprays as needed.  Atrovent refills. 3.  The patient is encouraged to call with any questions or concerns.

## 2024-01-21 NOTE — Progress Notes (Signed)
 Patient proceed with surgical consult with Dr. Laverle Patter today.  Patient will be referred to [facility] by [facility] to learn more about focal therapy.   RN will follow up at a later date to review treatment decision after appointment with [facility].

## 2024-02-04 NOTE — Progress Notes (Signed)
 RN left message for follow up call to review treatment decision or any follow up questions.

## 2024-02-06 ENCOUNTER — Other Ambulatory Visit: Payer: Self-pay | Admitting: Cardiology

## 2024-02-11 NOTE — Progress Notes (Signed)
 RN spoke with patient to follow up regarding treatment decision.  Patient remains undecided and is still planning to meet with Dr. Marlana Salvage at [facility] to learn more about focal therapy.  He does not currently have an appointment but has been contact by [facility] and plans to reach out to them today.  Patient verbalized he does not want to initiate treatment until closer to August/September.  RN encouraged patient to reach out with any questions or decisions that may arise.

## 2024-04-14 ENCOUNTER — Other Ambulatory Visit: Payer: Self-pay | Admitting: Cardiology

## 2024-04-14 DIAGNOSIS — I480 Paroxysmal atrial fibrillation: Secondary | ICD-10-CM

## 2024-04-15 NOTE — Telephone Encounter (Signed)
 Eliquis  5mg  refill request received. Patient is 68 years old, weight-82.1kg, Crea-1.15 on 06/17/23, Diagnosis-Afib, and last seen by Lawana Pray on 10/09/23 via Tele Visit. Dose is appropriate based on dosing criteria. Will send in refill to requested pharmacy.

## 2024-04-21 ENCOUNTER — Encounter: Payer: Self-pay | Admitting: Cardiology

## 2024-04-21 ENCOUNTER — Ambulatory Visit (INDEPENDENT_AMBULATORY_CARE_PROVIDER_SITE_OTHER): Admitting: Cardiology

## 2024-04-21 ENCOUNTER — Ambulatory Visit (HOSPITAL_BASED_OUTPATIENT_CLINIC_OR_DEPARTMENT_OTHER)
Admission: RE | Admit: 2024-04-21 | Discharge: 2024-04-21 | Disposition: A | Discharge status: Home / Self Care | Source: Ambulatory Visit | Attending: Cardiology | Admitting: Cardiology

## 2024-04-21 VITALS — BP 120/70 | HR 91 | Ht 70.0 in | Wt 178.0 lb

## 2024-04-21 DIAGNOSIS — I480 Paroxysmal atrial fibrillation: Secondary | ICD-10-CM

## 2024-04-21 DIAGNOSIS — B999 Unspecified infectious disease: Secondary | ICD-10-CM

## 2024-04-21 DIAGNOSIS — E119 Type 2 diabetes mellitus without complications: Secondary | ICD-10-CM | POA: Insufficient documentation

## 2024-04-21 DIAGNOSIS — R052 Subacute cough: Secondary | ICD-10-CM

## 2024-04-21 DIAGNOSIS — I2510 Atherosclerotic heart disease of native coronary artery without angina pectoris: Secondary | ICD-10-CM | POA: Insufficient documentation

## 2024-04-21 DIAGNOSIS — E782 Mixed hyperlipidemia: Secondary | ICD-10-CM | POA: Insufficient documentation

## 2024-04-21 DIAGNOSIS — H1045 Other chronic allergic conjunctivitis: Secondary | ICD-10-CM | POA: Insufficient documentation

## 2024-04-21 HISTORY — DX: Other chronic allergic conjunctivitis: H10.45

## 2024-04-21 HISTORY — DX: Unspecified infectious disease: B99.9

## 2024-04-21 HISTORY — DX: Subacute cough: R05.2

## 2024-04-21 NOTE — Patient Instructions (Signed)
 Medication Instructions:  No changes *If you need a refill on your cardiac medications before your next appointment, please call your pharmacy*  Lab Work: Today we are going to order for you to have bmet and Lft's drawn If you have labs (blood work) drawn today and your tests are completely normal, you will receive your results only by: MyChart Message (if you have MyChart) OR A paper copy in the mail If you have any lab test that is abnormal or we need to change your treatment, we will call you to review the results.  Testing/Procedures: We have ordered a Chest X-Ray to be preformed  Follow-Up: At [facility], you and your health needs are our priority.  As part of our continuing mission to provide you with exceptional heart care, our providers are all part of one team.  This team includes your primary Cardiologist (physician) and Advanced Practice Providers or APPs (Physician Assistants and Nurse Practitioners) who all work together to provide you with the care you need, when you need it.  Your next appointment:   1 month(s) prior to the 15th  Provider:   Hillis Lu, MD    We recommend signing up for the patient portal called MyChart.  Sign up information is provided on this After Visit Summary.  MyChart is used to connect with patients for Virtual Visits (Telemedicine).  Patients are able to view lab/test results, encounter notes, upcoming appointments, etc.  Non-urgent messages can be sent to your provider as well.   To learn more about what you can do with MyChart, go to [email or web address].

## 2024-04-21 NOTE — Progress Notes (Signed)
 Cardiology Office Note:    Date:  04/21/2024   ID:  Mark Shah, DOB December 30, 1955, MRN [id number]  PCP:  Lanae Pinal, MD  Cardiologist:  Nelia Balzarine, MD   Referring MD: Lanae Pinal, MD    ASSESSMENT:    1. Atherosclerosis of native coronary artery of native heart without angina pectoris   2. Coronary artery disease involving native coronary artery of native heart without angina pectoris   3. PAF (paroxysmal atrial fibrillation) (HCC)   4. Mixed dyslipidemia   5. Diabetes mellitus without complication (HCC)    PLAN:    In order of problems listed above:  Coronary artery disease: Secondary prevention stressed with the patient.  Importance of compliance with diet medication stressed any vocalized understanding.  He walks 2 to 3 miles on a regular basis without any symptoms. Paroxysmal atrial fibrillation:I discussed with the patient atrial fibrillation, disease process. Management and therapy including rate and rhythm control, anticoagulation benefits and potential risks were discussed extensively with the patient. Patient had multiple questions which were answered to patient's satisfaction.  His paroxysms of atrial fibrillation are increasing and I think the options for him at this point are Multaq  and amiodarone therapy.  I discussed both medicines potential benefits and risks of each and he vocalized understanding and questions were answered to his satisfaction.  He prefers amiodarone.  I will get chest x-ray and blood work as baseline and initiate him on amiodarone 400 mg a day for 2 weeks then 200 mg daily for 2 weeks if his labs are fine.  Chest x-ray will be done at baseline. Essential hypertension: Blood pressure stable and diet was emphasized. Mixed dyslipidemia and diabetes mellitus: Elevated hemoglobin A1c and he is going to see his endocrinologist tomorrow.  Goal LDL also must be less than 60 and he is aware of it. Patient will be seen in follow-up appointment in 4  weeks or earlier if the patient has any concerns.    Medication Adjustments/Labs and Tests Ordered: Current medicines are reviewed at length with the patient today.  Concerns regarding medicines are outlined above.  No orders of the defined types were placed in this encounter.  No orders of the defined types were placed in this encounter.    Chief Complaint  Patient presents with   Follow-up     History of Present Illness:    Mark Shah is a 68 y.o. male.  Patient has past medical history of coronary artery disease post stenting, essential hypertension, mixed dyslipidemia, diabetes mellitus and paroxysmal atrial fibrillation.  He denies any problems at this time and takes care of activities of daily living.  No chest pain orthopnea or PND.  At the time of my evaluation, the patient is alert awake oriented and in no distress.  He has been having recurrences of atrial fibrillation and concerned about it.  When his heart rate goes into the 120s and 130s he has an element of chest pain.  At the time of my evaluation, the patient is alert awake oriented and in no distress.  Past Medical History:  Diagnosis Date   Acquired hypothyroidism 08/28/2013   Adhesive capsulitis    in shoulders   Atherosclerosis of coronary artery 05/14/2018   Formatting of this note might be different from the original. Formatting of this note might be different from the original. Cath 05/13/18 LM normal, ostial LAD 25%, 95% prox LAD, 70% prox diag, 40% mid LAD, 30% prox circ, irregularities RCA  2.75 x 12 mm  Synergy stent to prox LAD post dil to 2.9 and 2.25 x 12 mm Synergy stent post dial to 2.4 Dr. Addie Holstein   Atherosclerotic heart disease of native coronary artery without angina pectoris 02/06/2021   Atrial fibrillation with RVR (HCC) 01/03/2019   Atrial flutter, paroxysmal (HCC) 01/07/2019   CAD (coronary artery disease), native coronary artery 05/14/2018   Cath 05/13/18 LM normal, ostial LAD 25%, 95% prox LAD, 70%  prox diag, 40% mid LAD, 30% prox circ, irregularities RCA  2.75 x 12 mm Synergy stent to prox LAD post dil to 2.9 and 2.25 x 12 mm Synergy stent post dial to 2.4 Dr. Addie Holstein    Chronic maxillary sinusitis 02/06/2021   Deviated septum 10/06/2019   Diabetes mellitus due to underlying condition with unspecified complications (HCC) 09/04/2021   Diabetes mellitus without complication (HCC)    Diverticulitis    Dyslipidemia    ED (erectile dysfunction)    ED (erectile dysfunction) of organic origin 02/06/2021   Encounter for immunization 07/03/2022   Essential hypertension 05/13/2018   Gastroesophageal reflux disease without esophagitis 10/06/2019   Globus pharyngeus 10/06/2019   Hearing loss of left ear 01/12/2020   HTN (hypertension)    Hypercholesteremia 01/03/2019   Hyperglycemia due to type 1 diabetes mellitus (HCC) 05/13/2018   Hyperlipidemia    Hyperlipidemia due to type 1 diabetes mellitus (HCC) 05/13/2018   Hyperlipidemia, unspecified    Hypothyroid 08/28/2013   Hypothyroidism    Insomnia 02/06/2021   Left lower quadrant pain 02/06/2021   Left-sided tinnitus 01/12/2020   Long term (current) use of insulin  (HCC) 02/06/2021   Low back pain 02/06/2021   Mixed dyslipidemia 01/07/2019   Nasal turbinate hypertrophy 10/06/2019   Obstructive sleep apnea syndrome 02/06/2021   Other seborrheic keratosis 09/26/2021   Overweight 02/06/2021   PAF (paroxysmal atrial fibrillation) (HCC) 09/19/2021   Perioral dermatitis 09/26/2021   Peripheral neuropathy 02/06/2021   PONV (postoperative nausea and vomiting)    Pure hypercholesterolemia 02/06/2021   Reactive depression 02/06/2021   Rhinitis, chronic 10/06/2019   Seborrheic dermatitis 07/03/2022   Type 1 diabetes mellitus (HCC) 08/28/2013   Typical atrial flutter (HCC)    Unspecified abnormal finding in specimens from other organs, systems and tissues 02/06/2021    Past Surgical History:  Procedure Laterality Date   A-FLUTTER ABLATION N/A 08/28/2019   Procedure: A-FLUTTER  ABLATION;  Surgeon: Lei Pump, MD;  Location: [facility] INVASIVE CV LAB;  Service: Cardiovascular;  Laterality: N/A;   BACK SURGERY     CERVICAL DISC SURGERY     CORONARY STENT INTERVENTION N/A 05/13/2018   Procedure: CORONARY STENT INTERVENTION;  Surgeon: Arleen Lacer, MD;  Location: [facility] INVASIVE CV LAB;  Service: Cardiovascular;  Laterality: N/A;   left hand     LEFT HEART CATH AND CORONARY ANGIOGRAPHY N/A 05/13/2018   Procedure: LEFT HEART CATH AND CORONARY ANGIOGRAPHY;  Surgeon: Arleen Lacer, MD;  Location: [facility] INVASIVE CV LAB;  Service: Cardiovascular;  Laterality: N/A;   PROSTATE BIOPSY      Current Medications: Current Meds  Medication Sig   acetaminophen  (TYLENOL ) 500 MG tablet Take 1,000 mg by mouth every 6 (six) hours as needed for moderate pain or headache.   apixaban  (ELIQUIS ) 5 MG TABS tablet TAKE 1 TABLET TWICE A DAY   clopidogrel  (PLAVIX ) 75 MG tablet Take 1 tablet (75 mg total) by mouth daily.   CONTOUR NEXT TEST test strip 3 (three) times daily.   fluticasone  (FLONASE ) 50 MCG/ACT nasal spray Place 1 spray  into both nostrils daily as needed for allergies.   fluticasone  (FLOVENT  HFA) 44 MCG/ACT inhaler 2 puffs Inhalation Twice a day on any day that xopenex is required for 30 days   Glucagon HCl 1 MG SOLR Inject 1 mg into the skin once as needed (low blood sugar).   ipratropium (ATROVENT ) 0.03 % nasal spray Place 1 spray into both nostrils as needed for rhinitis.   ipratropium (ATROVENT ) 0.06 % nasal spray Place 2 sprays into both nostrils 2 (two) times daily as needed (drainage).   levothyroxine  (SYNTHROID ) 137 MCG tablet Take 137 mcg by mouth every morning.   LYUMJEV 100 UNIT/ML SOLN Inject 60 Units into the skin daily. Via insulin  pump   MAGNESIUM PO Take 1 tablet by mouth at bedtime.   nitroGLYCERIN  (NITROSTAT ) 0.4 MG SL tablet Place 1 tablet (0.4 mg total) under the tongue every 5 (five) minutes as needed for chest pain.   omega-3 acid ethyl esters (LOVAZA) 1 g  capsule Take 1 g by mouth daily.   predniSONE (DELTASONE) 5 MG tablet Take 5 mg by mouth 5 (five) times daily.   ranolazine  (RANEXA ) 1000 MG SR tablet Take 1 tablet (1,000 mg total) by mouth 2 (two) times daily.   rosuvastatin  (CRESTOR ) 40 MG tablet Take 1 tablet (40 mg total) by mouth daily.   tadalafil (CIALIS) 20 MG tablet Take 20 mg by mouth daily as needed for erectile dysfunction.   traZODone (DESYREL) 100 MG tablet Take 100 mg by mouth at bedtime.     Allergies:   Atorvastatin, Erythromycin, and Silicone   Social History   Socioeconomic History   Marital status: Married    Spouse name: Not on file   Number of children: Not on file   Years of education: Not on file   Highest education level: Not on file  Occupational History   Not on file  Tobacco Use   Smoking status: Former    Passive exposure: Past   Smokeless tobacco: Never  Vaping Use   Vaping status: Never Used  Substance and Sexual Activity   Alcohol  use: Not Currently   Drug use: No   Sexual activity: Yes  Other Topics Concern   Not on file  Social History Narrative   Not on file   Social Drivers of Health   Financial Resource Strain: Not on file  Food Insecurity: No Food Insecurity (01/02/2024)   Hunger Vital Sign    Worried About Running Out of Food in the Last Year: Never true    Ran Out of Food in the Last Year: Never true  Transportation Needs: No Transportation Needs (01/02/2024)   PRAPARE - Transportation    Lack of Transportation (Medical): No    Lack of Transportation (Non-Medical): No  Physical Activity: Not on file  Stress: Not on file  Social Connections: Not on file     Family History: The patient's family history includes Asthma in his father and mother; COPD in his father; CVA in his paternal grandfather; Heart Problems in his father and paternal grandmother; Hyperlipidemia in his mother and paternal grandfather; Lung cancer in his maternal grandfather; Other in his brother; Stroke in his  paternal grandfather.  ROS:   Please see the history of present illness.    All other systems reviewed and are negative.  EKGs/Labs/Other Studies Reviewed:    The following studies were reviewed today: .Aaron Aas   I discussed my findings with the patient at length   Recent Labs: 05/31/2023: Hemoglobin 12.4; Platelets 191  06/17/2023: ALT 18; BUN 10; Creatinine, Ser 1.15; Potassium 4.3; Sodium 131  Recent Lipid Panel    Component Value Date/Time   CHOL 194 06/17/2023 1134   TRIG 91 06/17/2023 1134   HDL 69 06/17/2023 1134   CHOLHDL 2.8 06/17/2023 1134   LDLCALC 109 (H) 06/17/2023 1134    Physical Exam:    VS:  BP 120/70   Pulse 91   Ht 5' 10 (1.778 m)   Wt 178 lb (80.7 kg)   SpO2 96%   BMI 25.54 kg/m     Wt Readings from Last 3 Encounters:  04/21/24 178 lb (80.7 kg)  01/06/24 181 lb (82.1 kg)  01/02/24 187 lb 9.6 oz (85.1 kg)     GEN: Patient is in no acute distress HEENT: Normal NECK: No JVD; No carotid bruits LYMPHATICS: No lymphadenopathy CARDIAC: Hear sounds regular, 2/6 systolic murmur at the apex. RESPIRATORY:  Clear to auscultation without rales, wheezing or rhonchi  ABDOMEN: Soft, non-tender, non-distended MUSCULOSKELETAL:  No edema; No deformity  SKIN: Warm and dry NEUROLOGIC:  Alert and oriented x 3 PSYCHIATRIC:  Normal affect   Signed, Nelia Balzarine, MD  04/21/2024 3:57 PM    [facility]

## 2024-04-22 ENCOUNTER — Ambulatory Visit: Payer: Self-pay | Admitting: Cardiology

## 2024-04-22 NOTE — Telephone Encounter (Signed)
Viewed in MyChart Routed to PCP  

## 2024-04-25 LAB — BASIC METABOLIC PANEL WITH GFR
BUN/Creatinine Ratio: 12 (ref 10–24)
BUN: 12 mg/dL (ref 8–27)
CO2: 22 mmol/L (ref 20–29)
Calcium: 8.7 mg/dL (ref 8.6–10.2)
Chloride: 97 mmol/L (ref 96–106)
Creatinine, Ser: 1 mg/dL (ref 0.76–1.27)
Glucose: 147 mg/dL — ABNORMAL HIGH (ref 70–99)
Potassium: 4.2 mmol/L (ref 3.5–5.2)
Sodium: 135 mmol/L (ref 134–144)
eGFR: 82 mL/min/{1.73_m2} (ref >59)

## 2024-04-25 LAB — HEPATIC FUNCTION PANEL
ALT: 30 IU/L (ref 0–44)
AST: 16 IU/L (ref 0–40)
Albumin: 4.1 g/dL (ref 3.9–4.9)
Alkaline Phosphatase: 58 IU/L (ref 44–121)
Bilirubin Total: 0.4 mg/dL (ref 0.0–1.2)
Bilirubin, Direct: 0.19 mg/dL (ref 0.00–0.40)
Total Protein: 6 g/dL (ref 6.0–8.5)

## 2024-05-14 ENCOUNTER — Encounter: Payer: Self-pay | Admitting: Cardiology

## 2024-05-14 ENCOUNTER — Ambulatory Visit: Attending: Cardiology | Admitting: Cardiology

## 2024-05-14 VITALS — BP 147/87 | HR 88 | Ht 70.0 in | Wt 180.4 lb

## 2024-05-14 DIAGNOSIS — E782 Mixed hyperlipidemia: Secondary | ICD-10-CM | POA: Insufficient documentation

## 2024-05-14 DIAGNOSIS — I10 Essential (primary) hypertension: Secondary | ICD-10-CM | POA: Diagnosis present

## 2024-05-14 DIAGNOSIS — I2510 Atherosclerotic heart disease of native coronary artery without angina pectoris: Secondary | ICD-10-CM | POA: Diagnosis present

## 2024-05-14 DIAGNOSIS — I480 Paroxysmal atrial fibrillation: Secondary | ICD-10-CM | POA: Diagnosis present

## 2024-05-14 MED ORDER — EZETIMIBE 10 MG PO TABS: 10.0000 mg | ORAL_TABLET | Freq: Every day | ORAL | 3 refills | Status: DC

## 2024-05-14 MED ORDER — AMIODARONE HCL 200 MG PO TABS: ORAL_TABLET | ORAL | 3 refills | Status: DC

## 2024-05-14 NOTE — Patient Instructions (Signed)
 Medication Instructions:  Your physician has recommended you make the following change in your medication:   Start Zetia  10 mg daily  Start Amiodarone  400 mg (2 tablets) daily for 2 weeks, then 200 mg (1 tablet) daily for 2 weeks then 100 mg (0.5 tablet) daily thereafter.  *If you need a refill on your cardiac medications before your next appointment, please call your pharmacy*   Lab Work: Your physician recommends that you have a LFT's today in the office.  Your physician recommends that you return for lab work in: 6 weeks for CMP and fasting lipids You need to have labs done when you are fasting.  You can come Monday through Thursday 8:30 am to 12:00 pm and 1:15 to 4:00. You do not need to make an appointment as the order has already been placed.  [facility].  If you have labs (blood work) drawn today and your tests are completely normal, you will receive your results only by: MyChart Message (if you have MyChart) OR A paper copy in the mail If you have any lab test that is abnormal or we need to change your treatment, we will call you to review the results.   Testing/Procedures: None ordered   Follow-Up: At [facility], you and your health needs are our priority.  As part of our continuing mission to provide you with exceptional heart care, we have created designated Provider Care Teams.  These Care Teams include your primary Cardiologist (physician) and Advanced Practice Providers (APPs -  Physician Assistants and Nurse Practitioners) who all work together to provide you with the care you need, when you need it.  We recommend signing up for the patient portal called MyChart.  Sign up information is provided on this After Visit Summary.  MyChart is used to connect with patients for Virtual Visits (Telemedicine).  Patients are able to view lab/test results, encounter notes, upcoming appointments, etc.  Non-urgent messages can be sent to your provider as  well.   To learn more about what you can do with MyChart, go to [email or web address].    Your next appointment:   2 month(s)  The format for your next appointment:   In Person  Provider:   Jennifer Crape, MD    Other Instructions none  Important Information About Sugar

## 2024-05-14 NOTE — Progress Notes (Signed)
 Cardiology Office Note:    Date:  05/14/2024   ID:  Mark Shah, DOB 07/23/1956, MRN [id number]  PCP:  Regino Slater, MD  Cardiologist:  Jennifer [name], MD   Referring MD: Regino Slater, MD    ASSESSMENT:    1. PAF (paroxysmal atrial fibrillation) (HCC)   2. Essential hypertension   3. Coronary artery disease involving native coronary artery of native heart without angina pectoris   4. Mixed hyperlipidemia    PLAN:    In order of problems listed above:  Coronary artery disease: Secondary prevention stressed with the patient.  Importance of compliance with diet medication stressed and patient verbalized standing.  He was advised to walk at least half an hour a day on a daily basis and he promises to do so.  He is already walking regularly without any issues. Paroxysmal atrial fibrillation:I discussed with the patient atrial fibrillation, disease process. Management and therapy including rate and rhythm control, anticoagulation benefits and potential risks were discussed extensively with the patient. Patient had multiple questions which were answered to patient's satisfaction.  I have initiated him on amiodarone  200 mg daily for 2 weeks then 200 mg daily for 2 weeks and then 100 mg daily.  He will have LFTs today.  He will be back in 6 weeks for blood work. Mixed dyslipidemia: Lipids not at goal so we will add Zetia  10 mg to his regimen.  Diet emphasized. Essential hypertension: Blood pressure stable and lifestyle modification urged salt intake issues discussed Patient will be seen in follow-up appointment in 2 months or earlier if the patient has any concerns.    Medication Adjustments/Labs and Tests Ordered: Current medicines are reviewed at length with the patient today.  Concerns regarding medicines are outlined above.  No orders of the defined types were placed in this encounter.  No orders of the defined types were placed in this encounter.    No chief complaint on  file.    History of Present Illness:    Mark Shah is a 68 y.o. male.  Patient has past medical history of paroxysmal atrial fibrillation post ablation, essential hypertension, dyslipidemia, coronary artery disease.  He denies any problems at this time.  No chest pain orthopnea or PND.  He tells me that he had 1 episode of atrial fibrillation documented on his watch.  It lasted an hour and a half.  No chest pain orthopnea PND dizziness or syncope.  At the time of my evaluation, the patient is alert awake oriented and in no distress.  Past Medical History:  Diagnosis Date   Acquired hypothyroidism 08/28/2013   Adhesive capsulitis    in shoulders   Atherosclerosis of coronary artery 05/14/2018   Formatting of this note might be different from the original. Formatting of this note might be different from the original. Cath 05/13/18 LM normal, ostial LAD 25%, 95% prox LAD, 70% prox diag, 40% mid LAD, 30% prox circ, irregularities RCA  2.75 x 12 mm Synergy stent to prox LAD post dil to 2.9 and 2.25 x 12 mm Synergy stent post dial to 2.4 Dr. Anner   Atherosclerotic heart disease of native coronary artery without angina pectoris 02/06/2021   Atrial fibrillation with RVR (HCC) 01/03/2019   Atrial flutter, paroxysmal (HCC) 01/07/2019   CAD (coronary artery disease), native coronary artery 05/14/2018   Cath 05/13/18 LM normal, ostial LAD 25%, 95% prox LAD, 70% prox diag, 40% mid LAD, 30% prox circ, irregularities RCA  2.75 x 12 mm Synergy  stent to prox LAD post dil to 2.9 and 2.25 x 12 mm Synergy stent post dial to 2.4 Dr. Anner    Chronic maxillary sinusitis 02/06/2021   Deviated septum 10/06/2019   Diabetes mellitus due to underlying condition with unspecified complications (HCC) 09/04/2021   Diabetes mellitus without complication (HCC)    Diverticulitis    Dyslipidemia    ED (erectile dysfunction)    ED (erectile dysfunction) of organic origin 02/06/2021   Encounter for immunization 07/03/2022    Essential hypertension 05/13/2018   Gastroesophageal reflux disease without esophagitis 10/06/2019   Globus pharyngeus 10/06/2019   Hearing loss of left ear 01/12/2020   HTN (hypertension)    Hypercholesteremia 01/03/2019   Hyperglycemia due to type 1 diabetes mellitus (HCC) 05/13/2018   Hyperlipidemia    Hyperlipidemia due to type 1 diabetes mellitus (HCC) 05/13/2018   Hyperlipidemia, unspecified    Hypothyroid 08/28/2013   Hypothyroidism    Insomnia 02/06/2021   Left lower quadrant pain 02/06/2021   Left-sided tinnitus 01/12/2020   Long term (current) use of insulin  (HCC) 02/06/2021   Low back pain 02/06/2021   Mixed dyslipidemia 01/07/2019   Nasal turbinate hypertrophy 10/06/2019   Obstructive sleep apnea syndrome 02/06/2021   Other seborrheic keratosis 09/26/2021   Overweight 02/06/2021   PAF (paroxysmal atrial fibrillation) (HCC) 09/19/2021   Perioral dermatitis 09/26/2021   Peripheral neuropathy 02/06/2021   PONV (postoperative nausea and vomiting)    Pure hypercholesterolemia 02/06/2021   Reactive depression 02/06/2021   Rhinitis, chronic 10/06/2019   Seborrheic dermatitis 07/03/2022   Type 1 diabetes mellitus (HCC) 08/28/2013   Typical atrial flutter (HCC)    Unspecified abnormal finding in specimens from other organs, systems and tissues 02/06/2021    Past Surgical History:  Procedure Laterality Date   A-FLUTTER ABLATION N/A 08/28/2019   Procedure: A-FLUTTER ABLATION;  Surgeon: Inocencio Soyla Lunger, MD;  Location: [facility] INVASIVE CV LAB;  Service: Cardiovascular;  Laterality: N/A;   BACK SURGERY     CERVICAL DISC SURGERY     CORONARY STENT INTERVENTION N/A 05/13/2018   Procedure: CORONARY STENT INTERVENTION;  Surgeon: Anner Alm ORN, MD;  Location: [facility] INVASIVE CV LAB;  Service: Cardiovascular;  Laterality: N/A;   left hand     LEFT HEART CATH AND CORONARY ANGIOGRAPHY N/A 05/13/2018   Procedure: LEFT HEART CATH AND CORONARY ANGIOGRAPHY;  Surgeon: Anner Alm ORN, MD;  Location: [facility] INVASIVE CV LAB;   Service: Cardiovascular;  Laterality: N/A;   PROSTATE BIOPSY      Current Medications: Current Meds  Medication Sig   acetaminophen  (TYLENOL ) 500 MG tablet Take 1,000 mg by mouth every 6 (six) hours as needed for moderate pain or headache.   apixaban  (ELIQUIS ) 5 MG TABS tablet TAKE 1 TABLET TWICE A DAY   clopidogrel  (PLAVIX ) 75 MG tablet Take 1 tablet (75 mg total) by mouth daily.   CONTOUR NEXT TEST test strip 3 (three) times daily.   fluticasone  (FLONASE ) 50 MCG/ACT nasal spray Place 1 spray into both nostrils daily as needed for allergies.   fluticasone  (FLOVENT  HFA) 44 MCG/ACT inhaler 2 puffs Inhalation Twice a day on any day that xopenex is required for 30 days   Glucagon HCl 1 MG SOLR Inject 1 mg into the skin once as needed (low blood sugar).   ipratropium (ATROVENT ) 0.03 % nasal spray Place 1 spray into both nostrils as needed for rhinitis.   ipratropium (ATROVENT ) 0.06 % nasal spray Place 2 sprays into both nostrils 2 (two) times daily as needed (  drainage).   levothyroxine  (SYNTHROID ) 137 MCG tablet Take 137 mcg by mouth every morning.   LYUMJEV 100 UNIT/ML SOLN Inject 60 Units into the skin daily. Via insulin  pump   nitroGLYCERIN  (NITROSTAT ) 0.4 MG SL tablet Place 1 tablet (0.4 mg total) under the tongue every 5 (five) minutes as needed for chest pain.   omega-3 acid ethyl esters (LOVAZA) 1 g capsule Take 1 g by mouth daily.   predniSONE (DELTASONE) 5 MG tablet Take 5 mg by mouth 5 (five) times daily. (Patient taking differently: Take 5 mg by mouth in the morning, at noon, in the evening, and at bedtime.)   ranolazine  (RANEXA ) 1000 MG SR tablet Take 1 tablet (1,000 mg total) by mouth 2 (two) times daily.   rosuvastatin  (CRESTOR ) 40 MG tablet Take 1 tablet (40 mg total) by mouth daily.   tadalafil (CIALIS) 20 MG tablet Take 20 mg by mouth daily as needed for erectile dysfunction.   traZODone (DESYREL) 100 MG tablet Take 100 mg by mouth at bedtime.     Allergies:   Atorvastatin,  Erythromycin, and Silicone   Social History   Socioeconomic History   Marital status: Married    Spouse name: Not on file   Number of children: Not on file   Years of education: Not on file   Highest education level: Not on file  Occupational History   Not on file  Tobacco Use   Smoking status: Former    Passive exposure: Past   Smokeless tobacco: Never  Vaping Use   Vaping status: Never Used  Substance and Sexual Activity   Alcohol  use: Not Currently   Drug use: No   Sexual activity: Yes  Other Topics Concern   Not on file  Social History Narrative   Not on file   Social Drivers of Health   Financial Resource Strain: Not on file  Food Insecurity: No Food Insecurity (01/02/2024)   Hunger Vital Sign    Worried About Running Out of Food in the Last Year: Never true    Ran Out of Food in the Last Year: Never true  Transportation Needs: No Transportation Needs (01/02/2024)   PRAPARE - Transportation    Lack of Transportation (Medical): No    Lack of Transportation (Non-Medical): No  Physical Activity: Not on file  Stress: Not on file  Social Connections: Not on file     Family History: The patient's family history includes Asthma in his father and mother; COPD in his father; CVA in his paternal grandfather; Heart Problems in his father and paternal grandmother; Hyperlipidemia in his mother and paternal grandfather; Lung cancer in his maternal grandfather; Other in his brother; Stroke in his paternal grandfather.  ROS:   Please see the history of present illness.    All other systems reviewed and are negative.  EKGs/Labs/Other Studies Reviewed:    The following studies were reviewed today: I discussed my findings with the patient at length   Recent Labs: 05/31/2023: Hemoglobin 12.4; Platelets 191 04/24/2024: ALT 30; BUN 12; Creatinine, Ser 1.00; Potassium 4.2; Sodium 135  Recent Lipid Panel    Component Value Date/Time   CHOL 194 06/17/2023 1134   TRIG 91  06/17/2023 1134   HDL 69 06/17/2023 1134   CHOLHDL 2.8 06/17/2023 1134   LDLCALC 109 (H) 06/17/2023 1134    Physical Exam:    VS:  BP (!) 147/87 (BP Location: Right Arm, Patient Position: Sitting, Cuff Size: Normal)   Pulse 88   Ht 5'  10 (1.778 m)   Wt 180 lb 6.4 oz (81.8 kg)   SpO2 99%   BMI 25.88 kg/m     Wt Readings from Last 3 Encounters:  05/14/24 180 lb 6.4 oz (81.8 kg)  04/21/24 178 lb (80.7 kg)  01/06/24 181 lb (82.1 kg)     GEN: Patient is in no acute distress HEENT: Normal NECK: No JVD; No carotid bruits LYMPHATICS: No lymphadenopathy CARDIAC: Hear sounds regular, 2/6 systolic murmur at the apex. RESPIRATORY:  Clear to auscultation without rales, wheezing or rhonchi  ABDOMEN: Soft, non-tender, non-distended MUSCULOSKELETAL:  No edema; No deformity  SKIN: Warm and dry NEUROLOGIC:  Alert and oriented x 3 PSYCHIATRIC:  Normal affect   Signed, Jennifer [name], MD  05/14/2024 1:42 PM    [facility]

## 2024-05-15 LAB — HEPATIC FUNCTION PANEL
ALT: 37 IU/L (ref 0–44)
AST: 20 IU/L (ref 0–40)
Albumin: 4.1 g/dL (ref 3.9–4.9)
Alkaline Phosphatase: 56 IU/L (ref 44–121)
Bilirubin Total: 0.5 mg/dL (ref 0.0–1.2)
Bilirubin, Direct: 0.25 mg/dL (ref 0.00–0.40)
Total Protein: 6.4 g/dL (ref 6.0–8.5)

## 2024-05-19 ENCOUNTER — Ambulatory Visit: Payer: Self-pay | Admitting: Cardiology

## 2024-05-25 NOTE — Progress Notes (Signed)
 RN left message for call back to review any treatment decision.

## 2024-06-12 NOTE — Progress Notes (Signed)
 RN spoke with patient to follow up regarding treatment decision or questions.   Patient remains undecided and is currently out of the country until 8/22.  Patient agreeable for follow up after 8/22.

## 2024-07-02 ENCOUNTER — Encounter: Payer: Self-pay | Admitting: Cardiology

## 2024-07-03 ENCOUNTER — Telehealth: Payer: Self-pay | Admitting: Cardiology

## 2024-07-03 NOTE — Progress Notes (Signed)
 RN left message for call back to review treatment decision or any follow up questions.

## 2024-07-03 NOTE — Telephone Encounter (Signed)
 Appointment changed as pt concerned with delayed appointment after new medication change at last appt.

## 2024-07-03 NOTE — Telephone Encounter (Signed)
 Patient stated his appointment on 9/11 has been rescheduled but he has had a medication change and will be out of town on 9/17 - 9/24 and 9/30 - 10/10.

## 2024-07-09 ENCOUNTER — Ambulatory Visit: Admitting: Cardiology

## 2024-07-09 NOTE — Progress Notes (Addendum)
 Cardiology Office Note   Date:  07/10/2024  ID:  Mark Shah, DOB [birth year], MRN [id number] PCP: Regino Slater, MD  [facility] Providers Cardiologist:  Mark Shah Mark Crape, MD Cardiology APP:  Mark Delon BROCKS, NP     I saw the patient along with Delon Mark in the office his EKG she has a Brugada pattern uncommon but can occur with amiodarone  was told to discontinue and reviewing his medications today and note these taken trazodone shares the same ectatic pathway of metabolism his amiodarone  I tried calling the patient there is no answer on his mobile phone I left a message that until he is seen back in follow-up by EP to remain off trazodone along with amiodarone .  History of Present Illness Mark Shah is a 68 y.o. male with a past medical history of CAD s/p DES to pLAD and DES to ostial first diagonal 2019, atrial fibrillation/flutter s/p ablation 2020, hypertension, RBBB, OSA, GERD, hypothyroidism, type 1 diabetes mellitus, history of prostate cancer, dyslipidemia.  06/17/2023 Myoview  normal, low risk 01/05/2021 Myoview  normal, low risk 08/28/2019 atrial flutter ablation 05/13/2018 cardiac cath pLAD 95% stenosed, s/p DES x 1; ostial first diagonal lesion 75% stenosed s/p DES x 1  Longstanding patient of Dr. Crape, in 2019 he began experiences episodes of chest pain while exercising, he subsequently underwent left heart cath with DES x 1 to his proximal LAD and DES x 1 to his first diagonal lesion.  In 2020 he underwent atrial flutter ablation.  In 2022 and again in 2024 he underwent Myoview  examinations which were normal, low risk.  He was most recently evaluated by Dr. Crape on 05/14/2024, his lipids were not at goal so Zetia  was added, he had had a recent episode of atrial fibrillation noted on his smart watch, and he was started on amiodarone  as well.  He presents today for follow up. His episodes of atrial fibrillation have not been bothersome since he started  amiodarone . His EKG today is concerning for Brugada type I, discussed with DOD Dr. Monetta, joint visit with the patient. He denies chest pain, palpitations, dyspnea, pnd, orthopnea, n, v, dizziness, syncope, edema, weight gain, or early satiety.    ROS: Review of Systems  All other systems reviewed and are negative.    Studies Reviewed EKG Interpretation Date/Time:  Friday July 10 2024 14:40:31 EDT Ventricular Rate:  70 PR Interval:  180 QRS Duration:  106 QT Interval:  424 QTC Calculation: 457 R Axis:   24  Text Interpretation: Normal sinus rhythm Low voltage QRS Brugada pattern, type 1 When compared with ECG of 17-Jun-2023 10:04, Vent. rate has decreased BY  60 BPM Right bundle branch block is no longer Present Confirmed by Mark Delon [phone]) on 07/10/2024 6:53:31 PM    Cardiac Studies & Procedures   ______________________________________________________________________________________________ CARDIAC CATHETERIZATION  CARDIAC CATHETERIZATION 05/13/2018  Conclusion Images from the original result were not included.   CULPRIT LESION: Prox LAD lesion is 95% stenosed.  A drug-eluting stent was successfully placed using a STENT SYNERGY DES 2.75X12. Postdilated to 2.9 mm  Post intervention, there is a 0% residual stenosis.  _____________________________________________________  Lesion #2: Ost 1st Diag lesion is 75% stenosed.  A drug-eluting stent was successfully placed using a STENT SYNERGY DES 2.25X12. Postdilated 2.4 mm  Post intervention, there is a 0% residual stenosis.  ______________________________________________________  Lida LAD lesion is 25% stenosed -lesion not involved with stent  Mid LAD-1 lesion is 50% stenosed. Mid LAD-2 lesion is 40% stenosed.  Prox  Cx lesion is 30% stenosed.  The left ventricular systolic function is normal. LVEF 55-65% by visual estimate. Normal LVEDP (10 mmHg)   Severe two-vessel disease involving the proximal LAD and  proximal 1st Diag branch --> post lesion successfully treated with DES stents.  Preserved LVEF with normal LVEDP  Plan: Return to short stay holding area for post PCI care.  Anticipate same-day discharge today. Recommend dual antiplatelet therapy with Aspirin  81mg  daily and Clopidogrel  75mg  daily long-term (beyond 12 months) because of Proximal LAD lesion (would be OK to stop ASA before 12 months if necessary).  He will follow-up with Dr. Blanca Alm Clay, M.D., M.S. Interventional Cardiologist  Pager # [phone] Phone # [phone] [address] [place], [place] [address]  Findings Coronary Findings Diagnostic  Dominance: Right  Left Main Vessel is large.  Left Anterior Descending Vessel is large. Ost LAD lesion is 25% stenosed. The lesion is eccentric. Prox LAD lesion is 95% stenosed. Vessel is the culprit lesion. The lesion is eccentric and irregular. Mid LAD-1 lesion is 50% stenosed. The lesion is eccentric. Mid LAD-2 lesion is 40% stenosed. The lesion is tubular and smooth.  First Diagonal Branch Vessel is moderate in size. Ost 1st Diag lesion is 75% stenosed. The lesion is eccentric and irregular.  Second Diagonal Branch Vessel is small in size.  Second Septal Branch Vessel is small in size.  Third Septal Branch Vessel is small in size.  Left Circumflex Vessel is large. Prox Cx lesion is 30% stenosed. The lesion is located at the bend.  Second Obtuse Marginal Branch Vessel is small in size.  Third Obtuse Marginal Branch Vessel is large in size.  Left Posterior Atrioventricular Artery Vessel is small in size.  Right Coronary Artery Vessel is large. The vessel exhibits minimal luminal irregularities.  Acute Marginal Branch Vessel is small in size.  Right Posterior Descending Artery Vessel is moderate in size. Vessel is angiographically normal.  Right Posterior Atrioventricular Artery Vessel is moderate in size. Vessel is  angiographically normal.  First Right Posterolateral Branch Vessel is small in size.  Second Right Posterolateral Branch Vessel is small in size.  Intervention  Prox LAD lesion Stent Lesion length:  10 mm. Lesion crossed with guidewire using a WIRE ASAHI PROWATER 180CM. Pre-stent angioplasty was performed using a BALLOON SAPPHIRE 2.0X10. Maximum pressure:  12 atm. Inflation time:  20 sec. A drug-eluting stent was successfully placed using a STENT SYNERGY DES 2.75X12. Maximum pressure: 14 atm. Inflation time: 30 sec. Stent strut is well apposed. Post-stent angioplasty was performed using a STENT SYNERGY DES [id number]. Maximum pressure:  16 atm. Inflation time:  20 sec. Stent Balloon at high ATM Post-Intervention Lesion Assessment The intervention was successful. Pre-interventional TIMI flow is 3. Post-intervention TIMI flow is 3. Treated lesion length:  12 mm. No complications occurred at this lesion. There is a 0% residual stenosis post intervention.  Ost 1st Diag lesion Stent Lesion length:  10 mm. Lesion crossed with guidewire using a WIRE ASAHI PROWATER 180CM. Pre-stent angioplasty was performed using a BALLOON SAPPHIRE 2.0X10. Maximum pressure:  8 atm. Inflation time:  20 sec. A drug-eluting stent was successfully placed using a STENT SYNERGY DES 2.25X12. Maximum pressure: 12 atm. Inflation time: 30 sec. Stent strut is well apposed. Post-stent angioplasty was performed using a STENT SYNERGY DES 2.25X12. Maximum pressure:  16 atm. Inflation time:  15 sec. Stent balloon high ATM Post-Intervention Lesion Assessment The intervention was successful. Pre-interventional TIMI flow is 3. Post-intervention TIMI  flow is 3. Treated lesion length:  12 mm. No complications occurred at this lesion. There is a 0% residual stenosis post intervention.   STRESS TESTS  MYOCARDIAL PERFUSION IMAGING 06/17/2023  Interpretation Summary   The study is normal. The study is low risk.   No ST deviation was  noted.   Left ventricular function is normal. Nuclear stress EF: 57%. The left ventricular ejection fraction is normal (55-65%). End diastolic cavity size is normal. End systolic cavity size is normal.   Prior study available for comparison from 01/05/2021. No changes compared to prior study.  Negative stress test, low risk study.  Similar to 2022.            ______________________________________________________________________________________________      Risk Assessment/Calculations  CHA2DS2-VASc Score = 4   This indicates a 4.8% annual risk of stroke. The patient's score is based upon: CHF History: 0 HTN History: 1 Diabetes History: 1 Stroke History: 0 Vascular Disease History: 1 Age Score: 1 Gender Score: 0            Physical Exam VS:  BP 126/68   Pulse 70   Ht 5' 10 (1.778 m)   Wt 185 lb 9.6 oz (84.2 kg)   SpO2 96%   BMI 26.63 kg/m        Wt Readings from Last 3 Encounters:  07/10/24 185 lb 9.6 oz (84.2 kg)  05/14/24 180 lb 6.4 oz (81.8 kg)  04/21/24 178 lb (80.7 kg)    GEN: Well nourished, well developed in no acute distress NECK: No JVD; No carotid bruits CARDIAC: RRR, no murmurs, rubs, gallops RESPIRATORY:  Clear to auscultation without rales, wheezing or rhonchi  ABDOMEN: Soft, non-tender, non-distended EXTREMITIES:  No edema; No deformity   ASSESSMENT AND PLAN CAD - LHC 2019 pLAD 95% stenosed, s/p DES x 1; ostial first diagonal lesion 75% stenosed s/p DES x 1. Stable with no anginal symptoms. No indication for ischemic evaluation.  He is on Eliquis  5 mg twice daily, Plavix  75 mg daily.  PAF/hypercoagulable state/on amiodarone  therapy - maintaining sinus rhythm, his EKG reviewed with DOD Dr. Monetta, appears to be Brugada type I, we will d/c his amiodarone . Refer to EP. Follow up in 2 weeks for nurse visit for repeat EKG. Not a candidate for flecainide secondary to CAD. He is keenly aware when his heart is out of rhythm, will give him cardizem  30 mg PO  PRN for HR > 100.   S/P atrial flutter ablation - s/p ablation 2020.         Dispo: CMET, mag, thyroid  panel. D/C amiodarone . Cardizem  30 mg PO PRN for HR > 100. Return 2 weeks for nurse visit for repeat EKG. Refer to EP to discuss atrial fibrillation ablation.   Signed, Delon JAYSON Hoover, NP

## 2024-07-10 ENCOUNTER — Encounter: Payer: Self-pay | Admitting: Cardiology

## 2024-07-10 ENCOUNTER — Ambulatory Visit: Attending: Cardiology | Admitting: Cardiology

## 2024-07-10 VITALS — BP 126/68 | HR 70 | Ht 70.0 in | Wt 185.6 lb

## 2024-07-10 DIAGNOSIS — I480 Paroxysmal atrial fibrillation: Secondary | ICD-10-CM

## 2024-07-10 DIAGNOSIS — D6859 Other primary thrombophilia: Secondary | ICD-10-CM | POA: Diagnosis present

## 2024-07-10 DIAGNOSIS — I2510 Atherosclerotic heart disease of native coronary artery without angina pectoris: Secondary | ICD-10-CM | POA: Diagnosis present

## 2024-07-10 DIAGNOSIS — Z79899 Other long term (current) drug therapy: Secondary | ICD-10-CM

## 2024-07-10 MED ORDER — DILTIAZEM HCL 30 MG PO TABS: 30.0000 mg | ORAL_TABLET | ORAL | 3 refills | Status: AC | PRN

## 2024-07-10 NOTE — Patient Instructions (Signed)
 Medication Instructions:  Your physician has recommended you make the following change in your medication:   Stop Amiodarone   Take Cardizem  30 mg as needed for heart rate greater than 100.   *If you need a refill on your cardiac medications before your next appointment, please call your pharmacy*   Lab Work: Your physician recommends that you have a CMP, magnesium and thyroid  panel today in the office.  If you have labs (blood work) drawn today and your tests are completely normal, you will receive your results only by: MyChart Message (if you have MyChart) OR A paper copy in the mail If you have any lab test that is abnormal or we need to change your treatment, we will call you to review the results.   Testing/Procedures: None ordered   Follow-Up: At [facility], you and your health needs are our priority.  As part of our continuing mission to provide you with exceptional heart care, we have created designated Provider Care Teams.  These Care Teams include your primary Cardiologist (physician) and Advanced Practice Providers (APPs -  Physician Assistants and Nurse Practitioners) who all work together to provide you with the care you need, when you need it.  We recommend signing up for the patient portal called MyChart.  Sign up information is provided on this After Visit Summary.  MyChart is used to connect with patients for Virtual Visits (Telemedicine).  Patients are able to view lab/test results, encounter notes, upcoming appointments, etc.  Non-urgent messages can be sent to your provider as well.   To learn more about what you can do with MyChart, go to [email or web address].    Your next appointment:   6 month(s)  The format for your next appointment:   In Person  Provider:   Jennifer Crape, MD    Other Instructions none  Important Information About Sugar

## 2024-07-11 LAB — COMPREHENSIVE METABOLIC PANEL WITH GFR
ALT: 27 IU/L (ref 0–44)
AST: 17 IU/L (ref 0–40)
Albumin: 4 g/dL (ref 3.9–4.9)
Alkaline Phosphatase: 57 IU/L (ref 44–121)
BUN/Creatinine Ratio: 9 — ABNORMAL LOW (ref 10–24)
BUN: 11 mg/dL (ref 8–27)
Bilirubin Total: 0.5 mg/dL (ref 0.0–1.2)
CO2: 20 mmol/L (ref 20–29)
Calcium: 8.8 mg/dL (ref 8.6–10.2)
Chloride: 97 mmol/L (ref 96–106)
Creatinine, Ser: 1.27 mg/dL (ref 0.76–1.27)
Globulin, Total: 2.2 g/dL (ref 1.5–4.5)
Glucose: 308 mg/dL — ABNORMAL HIGH (ref 70–99)
Potassium: 4.5 mmol/L (ref 3.5–5.2)
Sodium: 133 mmol/L — ABNORMAL LOW (ref 134–144)
Total Protein: 6.2 g/dL (ref 6.0–8.5)
eGFR: 62 mL/min/1.73

## 2024-07-11 LAB — THYROID PANEL WITH TSH
Free Thyroxine Index: 3.4 (ref 1.2–4.9)
T3 Uptake Ratio: 31 % (ref 24–39)
T4, Total: 10.9 ug/dL (ref 4.5–12.0)
TSH: 3.25 u[IU]/mL (ref 0.450–4.500)

## 2024-07-11 LAB — MAGNESIUM: Magnesium: 2.3 mg/dL (ref 1.6–2.3)

## 2024-07-13 ENCOUNTER — Ambulatory Visit: Payer: Self-pay | Admitting: Cardiology

## 2024-07-13 NOTE — Telephone Encounter (Signed)
-----   Message from Delon JAYSON Hoover sent at 07/13/2024  8:41 AM EDT ----- He is coming back for a nurse visit for repeat EKG.  Please see EKG recommendations per Dr. Inocencio below: ----- Message ----- From: Inocencio Soyla Lunger, MD Sent: 07/11/2024  11:19 AM EDT To: Delon JAYSON Hoover, NP  Agreed to the EKG is somewhat concerning.  I think stopping amiodarone  is reasonable.  When he comes in for his EKG in 2 weeks, I would have them do 3 separate EKGs.  1 with regular electrode positioning, a second with V1 and V2 one intercostal space up, and a third with V1 and V2 two intercostal spaces up.  This is usually one of the best ways to get EKGs for Brugada. ----- Message ----- From: Hoover Delon JAYSON, NP Sent: 07/10/2024   7:03 PM EDT To: Soyla Lunger Inocencio, MD  You previously ablated him for flutter.   Episode of fib, so Revankar started him on amio ~ 2 months ago.  Saw today, EKG looked like Brugada to me, we stopped amio. I am sending to you for AF ablation discussion.   No symptoms currently.   He is coming back in 2 weeks for repeat EKG.   Can you peek at the chart/EKG and make sure I am not missing anything?

## 2024-07-15 NOTE — Telephone Encounter (Signed)
 Patient was returning call. Please advise ?

## 2024-07-16 ENCOUNTER — Ambulatory Visit: Admitting: Cardiology

## 2024-07-23 NOTE — Progress Notes (Signed)
 RN left message for call back to review treatment decision.  Encouraged patient to call back so we could review need for getting him back in for PSA monitoring if he was wanting to proceed with active surveillance.

## 2024-07-31 ENCOUNTER — Ambulatory Visit

## 2024-07-31 VITALS — BP 120/70 | HR 65 | Resp 20 | Wt 184.4 lb

## 2024-07-31 DIAGNOSIS — I480 Paroxysmal atrial fibrillation: Secondary | ICD-10-CM

## 2024-07-31 NOTE — Progress Notes (Signed)
   Nurse Visit   Date of Encounter: 07/31/2024 ID: Mark Shah, DOB [birth year], MRN [id number]  PCP:  Regino Slater, MD   [facility] Providers Cardiologist:  Jennifer [name], MD Cardiology APP:  Carlin Delon BROCKS, NP      Visit Details   VS:  BP 120/70 (BP Location: Left Arm, Patient Position: Sitting, Cuff Size: Normal)   Pulse 65   Resp 20   Wt 184 lb 6.4 oz (83.6 kg)   SpO2 97%   BMI 26.46 kg/m  , BMI Body mass index is 26.46 kg/m.  Wt Readings from Last 3 Encounters:  07/31/24 184 lb 6.4 oz (83.6 kg)  07/10/24 185 lb 9.6 oz (84.2 kg)  05/14/24 180 lb 6.4 oz (81.8 kg)     Reason for visit: Perform EKG's to determine if patient is in Brugada syndrome Performed today: EKG, Vitals, Provider consulted and Education Changes (medications, testing, etc.) : No new orders at this time. Length of Visit: 45 minutes    Medications Adjustments/Labs and Tests Ordered: Orders Placed This Encounter  Procedures   EKG 12-Lead   EKG 12-Lead   EKG 12-Lead   No orders of the defined types were placed in this encounter.    Signed, Charlie LILLETTE Free, RN  07/31/2024 10:47 AM

## 2024-07-31 NOTE — Progress Notes (Signed)
 Letter mailed out due to being unable to reach via phone.

## 2024-08-03 ENCOUNTER — Other Ambulatory Visit: Payer: Self-pay | Admitting: Cardiology

## 2024-08-03 DIAGNOSIS — E782 Mixed hyperlipidemia: Secondary | ICD-10-CM

## 2024-08-03 DIAGNOSIS — I2510 Atherosclerotic heart disease of native coronary artery without angina pectoris: Secondary | ICD-10-CM

## 2024-08-12 ENCOUNTER — Ambulatory Visit: Admitting: Cardiology

## 2024-08-20 ENCOUNTER — Other Ambulatory Visit: Payer: Self-pay | Admitting: Cardiology

## 2024-08-23 NOTE — Progress Notes (Signed)
 Electrophysiology Office Note:   Date:  08/24/2024  ID:  Mark Shah, DOB 27-Feb-1956, MRN [id number]  Primary Cardiologist: Jennifer [name], MD Primary Heart Failure: None Electrophysiologist: None      History of Present Illness:   Mark Shah is a 68 y.o. male with h/o coronary artery disease, atrial flutter post ablation in 2020, atrial fibrillation seen today for  for Electrophysiology evaluation of atrial fibrillation at the request of Delon Hoover.    He was initially diagnosed with atrial fibrillation.  He was started on amiodarone .  Since starting amiodarone , he has developed a Brugada type pattern on his EKG.  Discussed the use of AI scribe software for clinical note transcription with the patient, who gave verbal consent to proceed.  History of Present Illness Mark Shah is a 68 year old male with atrial flutter who presents with episodes of atrial fibrillation.  He has been experiencing episodes of atrial fibrillation since August 2024, with approximately ten episodes over the past year. A cluster of episodes occurred in June 2025, but none have been experienced since April 11, 2024. He monitors his heart rhythm using an Apple Watch and has recordings of these episodes on his phone.  The episodes last up to two and a half hours and are accompanied by significant chest pain due to rapid heart rate. Sleeping often resolves the episodes, as they are usually gone upon waking. He mentions that alcohol  consumption may have triggered an episode once, but not consistently.  He was previously treated with amiodarone , which he took for about six weeks before discontinuing it over a month ago as he had a Brugada type pattern on his EKG.  He is concerned about stress potentially affecting his condition, although he understands it may not directly cause the episodes.    Review of systems complete and found to be negative unless listed in HPI.   EP Information / Studies  Reviewed:    EKG is ordered today. Personal review as below.  EKG Interpretation Date/Time:  Monday August 24 2024 14:26:11 EDT Ventricular Rate:  76 PR Interval:  180 QRS Duration:  122 QT Interval:  414 QTC Calculation: 465 R Axis:   47  Text Interpretation: Normal sinus rhythm Right bundle branch block When compared with ECG of 31-Jul-2024 09:11, No significant change since last tracing Confirmed by Harjit Douds ([id number]) on 08/24/2024 2:30:31 PM    Risk Assessment/Calculations:    CHA2DS2-VASc Score = 4   This indicates a 4.8% annual risk of stroke. The patient's score is based upon: CHF History: 0 HTN History: 1 Diabetes History: 1 Stroke History: 0 Vascular Disease History: 1 Age Score: 1 Gender Score: 0            Physical Exam:   VS:  BP 122/64   Pulse 76   Ht 5' 10 (1.778 m)   Wt 184 lb (83.5 kg)   SpO2 98%   BMI 26.40 kg/m    Wt Readings from Last 3 Encounters:  08/24/24 184 lb (83.5 kg)  07/31/24 184 lb 6.4 oz (83.6 kg)  07/10/24 185 lb 9.6 oz (84.2 kg)     GEN: Well nourished, well developed in no acute distress NECK: No JVD; No carotid bruits CARDIAC: Regular rate and rhythm, no murmurs, rubs, gallops RESPIRATORY:  Clear to auscultation without rales, wheezing or rhonchi  ABDOMEN: Soft, non-tender, non-distended EXTREMITIES:  No edema; No deformity   ASSESSMENT AND PLAN:    1.  Atrial flutter: Post ablation in 2020  2.  Paroxysmal atrial fibrillation: Was previously on amiodarone  but this was stopped due to a Brugada type pattern.  He has also had atrial flutter noted on EKG.  Atrial fibrillation was seen on his Apple watch.  He would likely benefit from rhythm control, but at this point, he is not quite ready for procedures or alternative medications.  He is feeling well.  I told him that if he has any further episodes and wants to proceed with ablation to call us  back.  3.  Secondary hypercoagulable state: On Eliquis   4.  Coronary artery  disease: Post LAD stent.  No current angina.  Plan per primary cardiology.  Follow up with EP Team in 6 months  Signed, Devesh Monforte Gladis Norton, MD

## 2024-08-24 ENCOUNTER — Ambulatory Visit: Attending: Cardiology | Admitting: Cardiology

## 2024-08-24 ENCOUNTER — Encounter: Payer: Self-pay | Admitting: Cardiology

## 2024-08-24 VITALS — BP 122/64 | HR 76 | Ht 70.0 in | Wt 184.0 lb

## 2024-08-24 DIAGNOSIS — D6869 Other thrombophilia: Secondary | ICD-10-CM | POA: Insufficient documentation

## 2024-08-24 DIAGNOSIS — I483 Typical atrial flutter: Secondary | ICD-10-CM | POA: Diagnosis present

## 2024-08-24 DIAGNOSIS — I2510 Atherosclerotic heart disease of native coronary artery without angina pectoris: Secondary | ICD-10-CM | POA: Insufficient documentation

## 2024-08-24 DIAGNOSIS — I480 Paroxysmal atrial fibrillation: Secondary | ICD-10-CM | POA: Insufficient documentation

## 2024-08-24 NOTE — Progress Notes (Signed)
 Patient returned call and would like to proceed with getting back in with urology for repeat PSA and follow up with Dr. Devere.  RN will coordinate for appropriate follow up's and notify patient.

## 2024-08-24 NOTE — Patient Instructions (Signed)
 Medication Instructions:  Your physician recommends that you continue on your current medications as directed. Please refer to the Current Medication list given to you today.  *If you need a refill on your cardiac medications before your next appointment, please call your pharmacy*  Lab Work: None ordered  If you have any lab test that is abnormal or we need to change your treatment, we will call you to review the results.  Testing/Procedures: None ordered  Follow-Up: At [facility], you and your health needs are our priority.  As part of our continuing mission to provide you with exceptional heart care, our providers are all part of one team.  This team includes your primary Cardiologist (physician) and Advanced Practice Providers or APPs (Physician Assistants and Nurse Practitioners) who all work together to provide you with the care you need, when you need it.  Your next appointment:   6 month(s)  Provider:   Soyla Norton, MD    Thank you for choosing [facility]!!   Maeola Domino, RN [phone]   Other Instructions  Please call the office if you would  like to schedule an ablation

## 2024-08-25 ENCOUNTER — Other Ambulatory Visit: Payer: Self-pay | Admitting: Family Medicine

## 2024-08-25 DIAGNOSIS — R1031 Right lower quadrant pain: Secondary | ICD-10-CM

## 2024-08-27 NOTE — Progress Notes (Signed)
 Patient is now scheduled at [facility] on 11/6 at 8 am with Rodena Ambrosia, NP.  RN left message with patient with detailed appointment information.

## 2024-08-28 ENCOUNTER — Ambulatory Visit
Admission: RE | Admit: 2024-08-28 | Discharge: 2024-08-28 | Disposition: A | Discharge status: Home / Self Care | Source: Ambulatory Visit | Attending: Family Medicine | Admitting: Family Medicine

## 2024-08-28 DIAGNOSIS — R1031 Right lower quadrant pain: Secondary | ICD-10-CM

## 2024-08-28 MED ORDER — IOPAMIDOL (ISOVUE-300) INJECTION 61%
100.0000 mL | Freq: Once | INTRAVENOUS | Status: AC | PRN
  Administered 2024-08-28: 100 mL via INTRAVENOUS

## 2024-09-08 NOTE — Progress Notes (Signed)
 [facility] [place]   Patient Name: Mark Shah   Patient DOB: 1955/11/25                Patient Age: 68 y.o.     Reason for Visit: Kao dropped his right Audeo P30 RIC off at the office with the concern that his receiver is broken.   Procedure: Replaced 2 P receiver and small vented dome. Problem resolved and aid given back to Mr.Younkin.  Follow-up: PRN   Billing: $15.00(clean and check)+$80.00(phonak receiver)=$95.00

## 2024-09-28 ENCOUNTER — Other Ambulatory Visit: Payer: Self-pay | Admitting: Cardiology

## 2024-10-15 ENCOUNTER — Other Ambulatory Visit: Payer: Self-pay | Admitting: Cardiology

## 2024-10-15 DIAGNOSIS — I480 Paroxysmal atrial fibrillation: Secondary | ICD-10-CM

## 2024-10-15 NOTE — Telephone Encounter (Signed)
 Prescription refill request for Eliquis  received. Indication:afib Last office visit:10/25 Scr: 1.27  9/25 Age:68 Weight:83.5  kg  Prescription refilled

## 2024-10-16 NOTE — Progress Notes (Signed)
 Patient has confirmed he would like to proceed with surgery for treatment.  RN notified Dr. Renda, patient aware.

## 2024-11-02 ENCOUNTER — Other Ambulatory Visit: Payer: Self-pay

## 2024-11-02 MED ORDER — EZETIMIBE 10 MG PO TABS: 10.0000 mg | ORAL_TABLET | Freq: Every day | ORAL | 2 refills | Status: AC

## 2024-11-25 ENCOUNTER — Telehealth: Payer: Self-pay | Admitting: Cardiology

## 2024-11-25 ENCOUNTER — Other Ambulatory Visit: Payer: Self-pay | Admitting: Urology

## 2024-11-25 NOTE — Telephone Encounter (Signed)
" ° °  Name: Eloy Fehl  DOB: Sep 06, 1956  MRN: [id number]  Primary Cardiologist: Jennifer [name], MD  Chart reviewed as part of pre-operative protocol coverage. Because of Daiquan Resnik past medical history and time since last visit, he will require a follow-up in-office visit in order to better assess preoperative cardiovascular risk. When seen by general cardiology APP 07/2024, recommended for follow-up in 6 months therefore let's plan for an in office visit late February to finalize preoperative plans from general cardiology standpoint. (Also saw EP 08/2024.)  Pre-op covering staff: - Please schedule appointment and call patient to inform them. If patient already had an upcoming appointment within acceptable timeframe, please add pre-op clearance to the appointment notes so provider is aware. - Please contact requesting surgeon's office via preferred method (i.e, phone, fax) to inform them of need for appointment prior to surgery.  This message will also be routed to pharmacy pool for input on holding Eliquis  as requested below so that this information is available to the clearing provider at time of patient's appointment. Regarding Plavix , hx of PCI to proximal LAD and proximal 1st Diag branch in 2019, meets criteria to hold per periop DAPT algorithm as long as stable at upcoming OV.  Keymiah Lyles N Harper Smoker, PA-C  11/25/2024, 11:07 AM   "

## 2024-11-25 NOTE — Telephone Encounter (Signed)
 OV preop clearance appt now scheduled

## 2024-11-25 NOTE — Telephone Encounter (Signed)
"  ° °  Pre-operative Risk Assessment    Patient Name: Mark Shah  DOB: Feb 25, 1956 MRN: [id number]   Date of last office visit: 07/10/24 Date of next office visit: Not yet scheduled   Request for Surgical Clearance    Procedure:  Robot assisted Laparoscopic Radical Prostatectomy and Bilateral Pelvic Lymphadnectomy  Date of Surgery:  Clearance 01/11/25                                Surgeon:  Dr. Gretel Ferrara Surgeon's Group or Practice Name:  [facility] Phone number:  [phone]  Fax number:  [phone]   Type of Clearance Requested:   - Medical  - Pharmacy:  Hold Clopidogrel  (Plavix ) Eliquis    Type of Anesthesia:  General    Additional requests/questions:     SignedHerma KATHEE Blush   11/25/2024, 10:44 AM   "

## 2024-11-30 NOTE — Telephone Encounter (Signed)
 Patient with diagnosis of A Fib on Eliquis  for anticoagulation.    Procedure: Robot assisted Laparoscopic Radical Prostatectomy and Bilateral Pelvic Lymphadnectomy  Date of procedure: 01/11/25   CHA2DS2-VASc Score = 4  This indicates a 4.8% annual risk of stroke. The patient's score is based upon: CHF History: 0 HTN History: 1 Diabetes History: 1 Stroke History: 0 Vascular Disease History: 1 Age Score: 1 Gender Score: 0    CrCl 65 ml/min Platelet count 229k  Patient has not had an Afib/aflutter ablation in the last 3 months, DCCV within the last 4 weeks or a watchman implanted in the last 45 days   Per office protocol, patient can hold Eliquis  for 3 days prior to procedure.    **This guidance is not considered finalized until pre-operative APP has relayed final recommendations.**

## 2024-12-01 NOTE — Telephone Encounter (Signed)
 Pt has appt with Lum Louis, NP 12/22/24.  Clearance can be addressed at this OV. Will fwd note to provider. Glendia Ferrier, PA-C    12/01/2024 11:55 AM

## 2024-12-22 ENCOUNTER — Ambulatory Visit: Admitting: Emergency Medicine

## 2025-01-11 ENCOUNTER — Ambulatory Visit (HOSPITAL_COMMUNITY): Admit: 2025-01-11 | Admitting: Urology
